# Patient Record
Sex: Male | Born: 1945 | Race: White | Hispanic: No | Marital: Married | Smoking: Former smoker
Health system: Southern US, Community
[De-identification: ages and names within clinical notes are randomized; demographics above are authoritative.]

## PROBLEM LIST (undated history)

## (undated) DIAGNOSIS — M545 Low back pain, unspecified: Secondary | ICD-10-CM

## (undated) DIAGNOSIS — G8929 Other chronic pain: Secondary | ICD-10-CM

## (undated) DIAGNOSIS — I10 Essential (primary) hypertension: Secondary | ICD-10-CM

## (undated) DIAGNOSIS — F32A Depression, unspecified: Secondary | ICD-10-CM

## (undated) DIAGNOSIS — Z86010 Personal history of colonic polyps: Secondary | ICD-10-CM

## (undated) DIAGNOSIS — I4891 Unspecified atrial fibrillation: Secondary | ICD-10-CM

## (undated) DIAGNOSIS — Z77098 Contact with and (suspected) exposure to other hazardous, chiefly nonmedicinal, chemicals: Secondary | ICD-10-CM

## (undated) DIAGNOSIS — F419 Anxiety disorder, unspecified: Secondary | ICD-10-CM

## (undated) DIAGNOSIS — B351 Tinea unguium: Secondary | ICD-10-CM

## (undated) DIAGNOSIS — M75102 Unspecified rotator cuff tear or rupture of left shoulder, not specified as traumatic: Secondary | ICD-10-CM

## (undated) DIAGNOSIS — N419 Inflammatory disease of prostate, unspecified: Secondary | ICD-10-CM

## (undated) DIAGNOSIS — E782 Mixed hyperlipidemia: Secondary | ICD-10-CM

## (undated) DIAGNOSIS — I214 Non-ST elevation (NSTEMI) myocardial infarction: Secondary | ICD-10-CM

## (undated) DIAGNOSIS — I480 Paroxysmal atrial fibrillation: Secondary | ICD-10-CM

## (undated) DIAGNOSIS — Z8701 Personal history of pneumonia (recurrent): Secondary | ICD-10-CM

## (undated) DIAGNOSIS — K219 Gastro-esophageal reflux disease without esophagitis: Secondary | ICD-10-CM

## (undated) DIAGNOSIS — G4730 Sleep apnea, unspecified: Secondary | ICD-10-CM

## (undated) DIAGNOSIS — M1990 Unspecified osteoarthritis, unspecified site: Secondary | ICD-10-CM

## (undated) DIAGNOSIS — I2510 Atherosclerotic heart disease of native coronary artery without angina pectoris: Secondary | ICD-10-CM

## (undated) DIAGNOSIS — E669 Obesity, unspecified: Secondary | ICD-10-CM

## (undated) DIAGNOSIS — G20A1 Parkinson's disease without dyskinesia, without mention of fluctuations: Secondary | ICD-10-CM

## (undated) DIAGNOSIS — F329 Major depressive disorder, single episode, unspecified: Secondary | ICD-10-CM

## (undated) DIAGNOSIS — G20 Parkinson's disease: Secondary | ICD-10-CM

## (undated) DIAGNOSIS — N529 Male erectile dysfunction, unspecified: Secondary | ICD-10-CM

## (undated) HISTORY — DX: Paroxysmal atrial fibrillation: I48.0

## (undated) HISTORY — PX: CATARACT EXTRACTION W/ INTRAOCULAR LENS  IMPLANT, BILATERAL: SHX1307

## (undated) HISTORY — PX: LUMBAR DISC SURGERY: SHX700

## (undated) HISTORY — DX: Unspecified atrial fibrillation: I48.91

## (undated) HISTORY — DX: Essential (primary) hypertension: I10

## (undated) HISTORY — DX: Personal history of pneumonia (recurrent): Z87.01

## (undated) HISTORY — DX: Gastro-esophageal reflux disease without esophagitis: K21.9

## (undated) HISTORY — DX: Obesity, unspecified: E66.9

## (undated) HISTORY — DX: Tinea unguium: B35.1

## (undated) HISTORY — PX: FRACTURE SURGERY: SHX138

## (undated) HISTORY — DX: Atherosclerotic heart disease of native coronary artery without angina pectoris: I25.10

## (undated) HISTORY — DX: Male erectile dysfunction, unspecified: N52.9

## (undated) HISTORY — DX: Low back pain: M54.5

## (undated) HISTORY — PX: KNEE ARTHROSCOPY: SHX127

## (undated) HISTORY — DX: Mixed hyperlipidemia: E78.2

## (undated) HISTORY — DX: Non-ST elevation (NSTEMI) myocardial infarction: I21.4

## (undated) HISTORY — PX: APPENDECTOMY: SHX54

## (undated) HISTORY — PX: BACK SURGERY: SHX140

## (undated) HISTORY — DX: Low back pain, unspecified: M54.50

## (undated) HISTORY — PX: ANTERIOR CERVICAL DECOMP/DISCECTOMY FUSION: SHX1161

## (undated) HISTORY — DX: Personal history of colonic polyps: Z86.010

## (undated) HISTORY — DX: Inflammatory disease of prostate, unspecified: N41.9

## (undated) HISTORY — PX: CARDIAC CATHETERIZATION: SHX172

---
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
## 1992-07-31 HISTORY — PX: WRIST FRACTURE SURGERY: SHX121

## 2007-12-31 ENCOUNTER — Ambulatory Visit: Payer: Self-pay | Admitting: Cardiology

## 2007-12-31 ENCOUNTER — Encounter: Payer: Self-pay | Admitting: Cardiology

## 2009-04-30 DEATH — deceased

## 2010-05-31 DIAGNOSIS — I214 Non-ST elevation (NSTEMI) myocardial infarction: Secondary | ICD-10-CM

## 2010-05-31 HISTORY — DX: Non-ST elevation (NSTEMI) myocardial infarction: I21.4

## 2010-06-17 ENCOUNTER — Ambulatory Visit: Payer: Self-pay | Admitting: Cardiology

## 2010-06-17 ENCOUNTER — Inpatient Hospital Stay (HOSPITAL_COMMUNITY): Admission: EM | Admit: 2010-06-17 | Discharge: 2010-06-22 | Payer: Self-pay | Admitting: Cardiology

## 2010-06-17 ENCOUNTER — Ambulatory Visit: Payer: Self-pay | Admitting: Cardiovascular Disease

## 2010-07-06 ENCOUNTER — Ambulatory Visit: Payer: Self-pay | Admitting: Cardiovascular Disease

## 2010-08-09 ENCOUNTER — Ambulatory Visit: Payer: Self-pay | Admitting: Cardiology

## 2010-08-12 ENCOUNTER — Ambulatory Visit: Payer: Self-pay | Admitting: Cardiology

## 2010-10-11 LAB — LIPID PANEL
HDL: 46 mg/dL (ref >39)
LDL Cholesterol: 82 mg/dL (ref 0–99)
Total CHOL/HDL Ratio: 3.1 RATIO
Triglycerides: 82 mg/dL (ref <150)
VLDL: 16 mg/dL (ref 0–40)

## 2010-10-11 LAB — CARDIAC PANEL(CRET KIN+CKTOT+MB+TROPI)
CK, MB: 1.2 ng/mL (ref 0.3–4.0)
Total CK: 80 U/L (ref 7–232)

## 2010-10-11 LAB — HEPARIN LEVEL (UNFRACTIONATED): Heparin Unfractionated: 0.68 IU/mL (ref 0.30–0.70)

## 2010-10-11 LAB — CBC
Hemoglobin: 14.2 g/dL (ref 13.0–17.0)
MCV: 95.5 fL (ref 78.0–100.0)
Platelets: 162 10*3/uL (ref 150–400)
RBC: 4.43 MIL/uL (ref 4.22–5.81)
WBC: 5.2 10*3/uL (ref 4.0–10.5)

## 2010-10-12 ENCOUNTER — Ambulatory Visit (INDEPENDENT_AMBULATORY_CARE_PROVIDER_SITE_OTHER): Payer: Self-pay | Admitting: Cardiology

## 2010-10-12 DIAGNOSIS — I253 Aneurysm of heart: Secondary | ICD-10-CM

## 2010-10-17 ENCOUNTER — Telehealth: Payer: Self-pay | Admitting: *Deleted

## 2010-10-17 MED ORDER — RANOLAZINE ER 500 MG PO TB12: 500.0000 mg | ORAL_TABLET | Freq: Two times a day (BID) | ORAL | Status: DC

## 2010-10-17 NOTE — Telephone Encounter (Signed)
Pt called, c/o chest pain for several weeks.  Pt saw Dr. Deborah Chalk on 10/12/10 with chest pain.  Dr. Deborah Chalk changed medications but, pt is still having chest pain 30 minutes after Imdur and for one hour after exercise.  Dr. Deborah Chalk notified and instructed RN to have pt stop Imdur 30 mg and start Ranexa 500 mg twice daily.  Pt notified and will come by to pick up samples.

## 2010-10-26 ENCOUNTER — Ambulatory Visit: Payer: Self-pay | Admitting: Cardiology

## 2010-10-31 ENCOUNTER — Encounter: Payer: Self-pay | Admitting: Cardiology

## 2010-10-31 DIAGNOSIS — I2510 Atherosclerotic heart disease of native coronary artery without angina pectoris: Secondary | ICD-10-CM | POA: Insufficient documentation

## 2010-10-31 DIAGNOSIS — E669 Obesity, unspecified: Secondary | ICD-10-CM | POA: Insufficient documentation

## 2010-10-31 DIAGNOSIS — I10 Essential (primary) hypertension: Secondary | ICD-10-CM | POA: Insufficient documentation

## 2010-10-31 DIAGNOSIS — E785 Hyperlipidemia, unspecified: Secondary | ICD-10-CM | POA: Insufficient documentation

## 2010-11-01 ENCOUNTER — Ambulatory Visit (INDEPENDENT_AMBULATORY_CARE_PROVIDER_SITE_OTHER): Payer: 59 | Admitting: Cardiology

## 2010-11-01 ENCOUNTER — Encounter: Payer: Self-pay | Admitting: Cardiology

## 2010-11-01 VITALS — BP 122/80 | HR 64 | Wt 227.0 lb

## 2010-11-01 DIAGNOSIS — R079 Chest pain, unspecified: Secondary | ICD-10-CM

## 2010-11-01 DIAGNOSIS — N529 Male erectile dysfunction, unspecified: Secondary | ICD-10-CM

## 2010-11-01 DIAGNOSIS — I208 Other forms of angina pectoris: Secondary | ICD-10-CM

## 2010-11-01 DIAGNOSIS — I209 Angina pectoris, unspecified: Secondary | ICD-10-CM

## 2010-11-01 DIAGNOSIS — I10 Essential (primary) hypertension: Secondary | ICD-10-CM

## 2010-11-01 NOTE — Assessment & Plan Note (Signed)
He would like to get off nitrates for angina in order to take Viagra.

## 2010-11-01 NOTE — Assessment & Plan Note (Addendum)
He is felt better since this prolonged episode of chest discomfort 2 weeks ago. His EKG shows some minor T-wave changes in the lateral leads which would be consistent with an occlusion of the small diagonal vessel documented by catheterization in November of 2011. I like to arrange to get a stress Cardiolite study to assess for function as well as possible scar. I think that he probably has occluded a small diagonal vessel and had a small infarction and now no longer has chronic ischemic myocardium. His coronary arteries were normal otherwise by catheterization in November of 2011.if he has a satisfactory stress study, we'll also need to evaluate possibility of sleep apnea.

## 2010-11-01 NOTE — Progress Notes (Signed)
Subjective:   David Combs a prolonged episode of chest discomfort on March 21 of his substernal discomfort lasted for approximately 2 hours. He was diaphoretic and nauseated. He went to [facility] did not get evaluated because of a long wait. He did not listen without any help. He felt somewhat weak and fatigued for a few days afterwards and then of the last 5 or 6 days has felt significantly better. He's not having as much discomfort and overall feels considerably better. He's having terrible drained since he started Ranexa was to discontinue that. They only occur at night. He is also interested in getting off of nitrates in order to use Viagra. He does snore heavily at night but has never been checked for sleep apnea. He's he slept well with Ambien last night  Current Outpatient Prescriptions  Medication Sig Dispense Refill  . acetaminophen (TYLENOL) 325 MG tablet Take 650 mg by mouth every 6 (six) hours as needed.        Marland Kitchen aspirin 325 MG tablet Take 325 mg by mouth daily.        . clopidogrel (PLAVIX) 75 MG tablet Take 75 mg by mouth daily.        Marland Kitchen GLUCOSAMINE-CHONDROITIN PO Take by mouth daily.        . metoprolol tartrate (LOPRESSOR) 25 MG tablet Take 25 mg by mouth. 1/2 TABLET ONE TO TWO TIMES DAILY       . nitroGLYCERIN (NITROSTAT) 0.4 MG SL tablet Place 0.4 mg under the tongue every 5 (five) minutes as needed.        . pantoprazole (PROTONIX) 40 MG tablet Take 40 mg by mouth daily.        . pravastatin (PRAVACHOL) 40 MG tablet Take 40 mg by mouth every other day.       . ranolazine (RANEXA) 500 MG 12 hr tablet Take 500 mg by mouth 1 dose over 46 hours.        Marland Kitchen DISCONTD: ranolazine (RANEXA) 500 MG 12 hr tablet Take 1 tablet (500 mg total) by mouth 2 (two) times daily.  60 tablet  11  . DISCONTD: isosorbide mononitrate (IMDUR) 30 MG 24 hr tablet Take 30 mg by mouth daily.        Marland Kitchen DISCONTD: losartan (COZAAR) 50 MG tablet Take 50 mg by mouth daily.          Allergies  Allergen  Reactions  . Iodinated Diagnostic Agents Swelling  . Sulfa Drugs Cross Reactors Itching    Patient Active Problem List  Diagnoses  . Angina effort  . Hypotension  . Dizziness  . Acute non-ST-segment elevation myocardial infarction  . SOB (shortness of breath)  . Hypertension  . Hyperlipemia  . Obesity  . GERD (gastroesophageal reflux disease)  . Tobacco abuse  . ED (erectile dysfunction)    History  Smoking status  . Former Smoker  . Quit date: 05/31/2002  Smokeless tobacco  . Never Used    History  Alcohol Use No    No family history on file.  Review of Systems:   The patient denies any heat or cold intolerance.  No weight gain or weight loss.  The patient denies headaches or blurry vision.  There is no cough or sputum production.  The patient denies dizziness.  There is no hematuria or hematochezia.  The patient denies any muscle aches or arthritis.  The patient denies any rash.  The patient denies frequent falling or instability.  There is no history of  depression or anxiety.  All other systems were reviewed and are negative.   Physical Exam:   Weight is 227. Blood pressure is 124/76 sitting, 122/80 standing, heart rate 64.The head is normocephalic and atraumatic.  Pupils are equally round and reactive to light.  Sclerae nonicteric.  Conjunctiva is clear.  Oropharynx is unremarkable.  There's adequate oral airway.  Neck is supple there are no masses.  Thyroid is not enlarged.  There is no lymphadenopathy.  Lungs are clear.  Chest is symmetric.  Heart shows a regular rate and rhythm.  S1 and S2 are normal.  There is no murmur click or gallop.  Abdomen is soft normal bowel sounds.  There is no organomegaly.  Genital and rectal deferred.  Extremities are without edema.  Peripheral pulses are adequate.  Neurologically intact.  Full range of motion.  The patient is not depressed.  Skin is warm and dry. Assessment / Plan:

## 2010-11-01 NOTE — Assessment & Plan Note (Signed)
Blood pressure remains well controlled on current medications.

## 2010-11-02 ENCOUNTER — Encounter (HOSPITAL_COMMUNITY): Payer: 59 | Admitting: Radiology

## 2010-11-07 ENCOUNTER — Ambulatory Visit: Payer: Self-pay | Admitting: Cardiology

## 2010-11-09 ENCOUNTER — Ambulatory Visit (HOSPITAL_COMMUNITY): Payer: PRIVATE HEALTH INSURANCE | Attending: Cardiology | Admitting: Radiology

## 2010-11-09 DIAGNOSIS — R0602 Shortness of breath: Secondary | ICD-10-CM

## 2010-11-09 DIAGNOSIS — R079 Chest pain, unspecified: Secondary | ICD-10-CM | POA: Insufficient documentation

## 2010-11-09 DIAGNOSIS — I2510 Atherosclerotic heart disease of native coronary artery without angina pectoris: Secondary | ICD-10-CM

## 2010-11-09 MED ORDER — TECHNETIUM TC 99M TETROFOSMIN IV KIT
33.0000 | PACK | Freq: Once | INTRAVENOUS | Status: AC | PRN
  Administered 2010-11-09: 33 via INTRAVENOUS

## 2010-11-09 MED ORDER — TECHNETIUM TC 99M TETROFOSMIN IV KIT
11.0000 | PACK | Freq: Once | INTRAVENOUS | Status: AC | PRN
  Administered 2010-11-09: 11 via INTRAVENOUS

## 2010-11-09 NOTE — Progress Notes (Signed)
[facility] 3 NUCLEAR MED [address] [place] [address] [phone]  Cardiology Nuclear Med Study  David Combs is a 65 y.o. male [id number] 04-21-1946   Nuclear Med Background Indication for Stress Test:  Evaluation for Ischemia and [facility]([place]) 10/19/10 with Chest Pain, left AMA History:  5-6 yrs ago GXT:OK per patient; 11/11 NSTEM>Cath:n/o CAD, EF=55-60%. Cardiac Risk Factors: History of Smoking, Hypertension, Lipids and Obesity  Symptoms:  Chest Pain/Pressure with Exertion (last date of chest discomfort was here today, 1/10), Diaphoresis, DOE, Nausea and SOB   Nuclear Pre-Procedure Caffeine/Decaff Intake:  None NPO After: 7:30am   Lungs:  Clear.   IV 0.9% NS with Angio Cath:  18g  IV Site: R Antecubital  IV Started by:  Stanton Kidney, EMT-P  Chest Size (in):  46 Cup Size: n/a  Height: 5\' 7"  (1.702 m)  Weight:  226 lb (102.513 kg)  BMI:  Body mass index is 35.40 kg/(m^2). Tech Comments:  Metoprolol held > 24 hours, per patient    Nuclear Med Study 1 or 2 day study: 1 day  Stress Test Type:  Stress  Reading MD: Kristeen Miss, MD  Order Authorizing Provider:  Roger Shelter, MD  Resting Radionuclide: Technetium 63m Tetrofosmin  Resting Radionuclide Dose: 11 mCi   Stress Radionuclide:  Technetium 44m Tetrofosmin  Stress Radionuclide Dose: 33 mCi           Stress Protocol Rest HR: 51 Stress HR:  138  Rest BP: 121/87 Stress BP: 172/71  Exercise Time (min): 10:00 METS: 11.0   Predicted Max HR: 157 bpm % Max HR: 87.9 bpm Rate Pressure Product: 21308   Dose of Adenosine (mg):  n/a Dose of Lexiscan: n/a mg  Dose of Atropine (mg): n/a Dose of Dobutamine: n/a mcg/kg/min (at max HR)  Stress Test Technologist: Rea College, CMA-N  Nuclear Technologist:  Doyne Keel, CNMT     Rest Procedure:  Myocardial perfusion imaging was performed at rest 45 minutes following the intravenous administration of Technetium 27m Tetrofosmin. Rest ECG:  Sinus bradycardia.  Stress Procedure:  The patient exercised for ten minutes on the treadmill utilizing the Bruce protocol.  The patient stopped due to fatigue.  He did c/o chest pain immediately post exercise, only 1/10.  There were no diagnostic ST-T wave changes.  Technetium 64m Tetrofosmin was injected at peak exercise and myocardial perfusion imaging was performed after a brief delay.  Stress ECG: No significant change from baseline ECG  QPS Raw Data Images:  Normal; no motion artifact; normal heart/lung ratio. Stress Images:  There is an apical defect with normal uptake in the other regions of the LV.   Rest Images:  There is apical defect with normal uptake in the other regions of the LV.  Subtraction (SDS):  There is a fixed apical defect consistent with a previous apical MI. Transient Ischemic Dilatation (Normal <1.22):  0.90 Lung/Heart Ratio (Normal <0.45):  0.35  Quantitative Gated Spect Images QGS EDV:  109 ml QGS ESV:  41 ml QGS cine images:  The overall LV function is normal.  There is mild hypokinesis of the apex as demonstrated by the surface LV images.  QGS EF: 62%  Impression Exercise Capacity:  Good exercise capacity. BP Response:  Normal blood pressure response. Clinical Symptoms:  No chest pain. ECG Impression:  No significant ST segment change suggestive of ischemia. Comparison with Prior Nuclear Study: No previous nuclear study performed  Overall Impression:  Low risk stress nuclear study.  There is evidence of a previous apical MI.  There is no evidence of reversible ischemia.   The overall LV function is normal.  There is mild hypokinesis of the apex as viewed in the surface LV images.   Elyn Aquas., MD

## 2010-11-10 ENCOUNTER — Encounter (HOSPITAL_COMMUNITY): Payer: Self-pay | Admitting: Cardiology

## 2010-11-10 NOTE — Progress Notes (Signed)
ROUTED TO DR. TENNANT.Falecha Clark ° °

## 2010-11-16 NOTE — Progress Notes (Signed)
David Combs This is a low risk study and satisfactory overall.

## 2010-11-17 ENCOUNTER — Telehealth: Payer: Self-pay | Admitting: *Deleted

## 2010-11-17 NOTE — Telephone Encounter (Signed)
Pt notified of nuclear results.  Pt will f/u with Dr. Deborah Chalk next week.

## 2010-11-17 NOTE — Progress Notes (Signed)
Pt notified of nuclear results.   

## 2010-11-23 ENCOUNTER — Encounter: Payer: Self-pay | Admitting: Cardiology

## 2010-11-23 ENCOUNTER — Ambulatory Visit (INDEPENDENT_AMBULATORY_CARE_PROVIDER_SITE_OTHER): Payer: 59 | Admitting: Cardiology

## 2010-11-23 DIAGNOSIS — I208 Other forms of angina pectoris: Secondary | ICD-10-CM

## 2010-11-23 DIAGNOSIS — I209 Angina pectoris, unspecified: Secondary | ICD-10-CM

## 2010-11-23 DIAGNOSIS — E669 Obesity, unspecified: Secondary | ICD-10-CM

## 2010-11-23 NOTE — Assessment & Plan Note (Signed)
I encouraged him to try lose 20-25 pounds

## 2010-11-23 NOTE — Progress Notes (Signed)
Subjective:   David Combs is seen today for followup of his stress Cardiolite study. He is small apical fixed defect with no evidence of ischemia and well-preserved global ejection fraction of 62%. Overall, he is felt to be at low risk. He is only taking his metoprolol once a day. In general, he has been feeling well and has been exercising on his treadmill.  Current Outpatient Prescriptions  Medication Sig Dispense Refill  . acetaminophen (TYLENOL) 325 MG tablet Take 650 mg by mouth every 6 (six) hours as needed.        Marland Kitchen aspirin 325 MG tablet Take 325 mg by mouth daily.        . clopidogrel (PLAVIX) 75 MG tablet Take 75 mg by mouth daily.        Marland Kitchen GLUCOSAMINE-CHONDROITIN PO Take by mouth daily.        . metoprolol tartrate (LOPRESSOR) 25 MG tablet Take 12.5 mg by mouth daily.       . nitroGLYCERIN (NITROSTAT) 0.4 MG SL tablet Place 0.4 mg under the tongue every 5 (five) minutes as needed.        . pantoprazole (PROTONIX) 40 MG tablet Take 40 mg by mouth daily.        . pravastatin (PRAVACHOL) 40 MG tablet Take 40 mg by mouth every other day.         Allergies  Allergen Reactions  . Iodinated Diagnostic Agents Swelling  . Sulfa Drugs Cross Reactors Itching    Patient Active Problem List  Diagnoses  . Angina effort  . Hypotension  . Dizziness  . Acute non-ST-segment elevation myocardial infarction  . SOB (shortness of breath)  . Hypertension  . Hyperlipemia  . Obesity  . GERD (gastroesophageal reflux disease)  . Tobacco abuse  . ED (erectile dysfunction)    History  Smoking status  . Former Smoker  . Quit date: 05/31/2002  Smokeless tobacco  . Never Used    History  Alcohol Use No    No family history on file.  Review of Systems:   The patient denies any heat or cold intolerance.  No weight gain or weight loss.  The patient denies headaches or blurry vision.  There is no cough or sputum production.  The patient denies dizziness.  There is no hematuria or hematochezia.   The patient denies any muscle aches or arthritis.  The patient denies any rash.  The patient denies frequent falling or instability.  There is no history of depression or anxiety.  All other systems were reviewed and are negative.   Physical Exam:   Weight is 228. Blood pressure is 152/88 sitting, heart rate is 70. Blood pressure from home are reviewed and are satisfactory.The head is normocephalic and atraumatic.  Pupils are equally round and reactive to light.  Sclerae nonicteric.  Conjunctiva is clear.  Oropharynx is unremarkable.  There's adequate oral airway.  Neck is supple there are no masses.  Thyroid is not enlarged.  There is no lymphadenopathy.  Lungs are clear.  Chest is symmetric.  Heart shows a regular rate and rhythm.  S1 and S2 are normal.  There is no murmur click or gallop.  Abdomen is soft normal bowel sounds.  There is no organomegaly.  Genital and rectal deferred.  Extremities are without edema.  Peripheral pulses are adequate.  Neurologically intact.  Full range of motion.  The patient is not depressed.  Skin is warm and dry.  Assessment / Plan:

## 2010-11-23 NOTE — Assessment & Plan Note (Signed)
Overall, [name] is at low risk. I've encouraged him to exercise and try lose 20 pounds and be under 200 pounds total. I will refer him to Dr. Diona Browner. I've given approval for him to use Viagra or Levitra. He is no longer on nitrates. I'll have him see Dr. Diona Browner in 4 months.

## 2011-01-25 ENCOUNTER — Encounter: Payer: Self-pay | Admitting: Cardiology

## 2011-03-02 ENCOUNTER — Ambulatory Visit: Payer: 59 | Admitting: Cardiology

## 2011-03-20 LAB — CBC AND DIFFERENTIAL: Hemoglobin: 14.7 g/dL (ref 13.5–17.5)

## 2011-03-20 LAB — BASIC METABOLIC PANEL: Glucose: 85 mg/dL

## 2011-03-20 LAB — HEPATIC FUNCTION PANEL: AST: 17 U/L (ref 14–40)

## 2011-03-21 ENCOUNTER — Encounter (INDEPENDENT_AMBULATORY_CARE_PROVIDER_SITE_OTHER): Payer: Self-pay

## 2011-03-21 ENCOUNTER — Encounter (INDEPENDENT_AMBULATORY_CARE_PROVIDER_SITE_OTHER): Payer: Self-pay | Admitting: *Deleted

## 2011-03-31 ENCOUNTER — Encounter: Payer: Self-pay | Admitting: Cardiology

## 2011-03-31 ENCOUNTER — Ambulatory Visit (INDEPENDENT_AMBULATORY_CARE_PROVIDER_SITE_OTHER): Payer: 59 | Admitting: Cardiology

## 2011-03-31 VITALS — BP 123/77 | HR 54 | Ht 67.0 in | Wt 226.0 lb

## 2011-03-31 DIAGNOSIS — I10 Essential (primary) hypertension: Secondary | ICD-10-CM

## 2011-03-31 DIAGNOSIS — N529 Male erectile dysfunction, unspecified: Secondary | ICD-10-CM

## 2011-03-31 DIAGNOSIS — K219 Gastro-esophageal reflux disease without esophagitis: Secondary | ICD-10-CM

## 2011-03-31 DIAGNOSIS — I2510 Atherosclerotic heart disease of native coronary artery without angina pectoris: Secondary | ICD-10-CM

## 2011-03-31 DIAGNOSIS — E785 Hyperlipidemia, unspecified: Secondary | ICD-10-CM

## 2011-03-31 NOTE — Assessment & Plan Note (Signed)
LDL under 100 on Pravachol. Continue to follow.

## 2011-03-31 NOTE — Patient Instructions (Signed)
Your physician wants you to follow-up in: 4 months. You will receive a reminder letter in the mail one-two months in advance. If you don't receive a letter, please call our office to schedule the follow-up appointment. Your physician recommends that you continue on your current medications as directed. Please refer to the Current Medication list given to you today. 

## 2011-03-31 NOTE — Assessment & Plan Note (Signed)
Blood pressure well-controlled today. 

## 2011-03-31 NOTE — Assessment & Plan Note (Signed)
Continues on proton pump inhibitor.

## 2011-03-31 NOTE — Assessment & Plan Note (Signed)
Has been using Cialis most recently. No progressive angina or nitroglycerin use.

## 2011-03-31 NOTE — Progress Notes (Signed)
Clinical Summary Mr. Stailey is a 65 y.o.male presenting to establish followup. He is a former patient of Dr. Deborah Chalk, most recently seen back in April. He had a followup Myoview done at that time that showed evidence of apical scar, no ischemia, LVEF of 62%. He has been managed medically, reports no recurrent angina following an episode of prolonged chest pain that occurred in March.  He reports no bleeding problems on aspirin and Plavix. Does use medication for erectile dysfunction, although reports no use of nitroglycerin.  Recent lipid profile was obtained by primary care showing an LDL under 100.  He states that he will be having a screening colonoscopy with Dr. Karilyn Cota in the near future.  Allergies  Allergen Reactions  . Iodinated Diagnostic Agents Swelling  . Sulfa Drugs Cross Reactors Itching    Medication list reviewed.  Past Medical History  Diagnosis Date  . Non-ST elevation myocardial infarction (NSTEMI)     11/11  . Essential hypertension, benign   . Mixed hyperlipidemia   . Obesity   . GERD (gastroesophageal reflux disease)   . ED (erectile dysfunction)   . Dermatophytosis of nail   . Prostatitis, unspecified   . Lumbago   . GERD (gastroesophageal reflux disease)   . Coronary atherosclerosis of native coronary artery     70-80% small diagonal - medically managed, LVEF 55%    Past Surgical History  Procedure Date  . Cervical spine surgery   . Right knee surgery   . Appendectomy   . Tonsillectomy AGE 5  . Left wrist fracture surgery 1994    No family history on file.  Social History Mr. Rawl reports that he quit smoking about 8 years ago. His smoking use included Cigarettes. He has a 40 pack-year smoking history. He has never used smokeless tobacco. Mr. Scarlata reports that he does not drink alcohol.  Review of Systems No palpitations or syncope. Otherwise reviewed and negative.  Physical Examination Filed Vitals:   03/31/11 1451  BP: 123/77  Pulse:  54  Overweight male in no acute distress. HEENT: Conjunctiva and lids normal, oropharynx is moist mucosa. Neck: Supple, no elevated JVP or carotid bruits. Lungs: Clear to auscultation, nonlabored Cardiac: Regular rate and rhythm, soft systolic murmur at the base, normal second heart sound, no S3. Abdomen: Soft, nontender, bowel sounds present. Skin: Warm and dry. Extremities: No pitting edema, distal pulses full. Musculoskeletal: No kyphosis. Neuropsychiatric: Alert and oriented x3, affect appropriate.   ECG Reviewed in EMR.    Problem List and Plan

## 2011-03-31 NOTE — Assessment & Plan Note (Signed)
Symptomatically stable on medical therapy. Recent Myoview from April is reviewed. Plan to continue observation at this point.

## 2011-04-17 ENCOUNTER — Ambulatory Visit (INDEPENDENT_AMBULATORY_CARE_PROVIDER_SITE_OTHER): Payer: 59 | Admitting: Internal Medicine

## 2011-06-15 ENCOUNTER — Telehealth: Payer: Self-pay | Admitting: *Deleted

## 2011-06-15 NOTE — Telephone Encounter (Signed)
Pt left message on voicemail asking for a return call regarding his medications.   Pt states he has been having bad dreams/nightmares. Pt saw dr at [facility] for these. He gave pt prazosin 2 mg and Mirtazapine 30 mg to take at bedtime. He would like to make sure Dr. Diona Browner is okay with him taking these meds with his previously prescribed medications.

## 2011-06-15 NOTE — Telephone Encounter (Signed)
No direct contraindication for him to take mirtazapine from a cardiac perspective. Prazosin is an alpha blocker, and could therefore lower his blood pressure further. He would need to keep a close eye on this in the setting of other medical therapy for hypertension. This medicine may also be a concern with him using Viagra, as it could also adversely lower blood pressure. I would hope that his [place] physician discussed these issues with him after reviewing his medications.

## 2011-06-16 NOTE — Telephone Encounter (Signed)
Pt notified. He will monitor BP and notify [facility] if BP seems to drop.

## 2011-07-12 ENCOUNTER — Encounter: Payer: Self-pay | Admitting: Cardiology

## 2011-07-12 ENCOUNTER — Ambulatory Visit (INDEPENDENT_AMBULATORY_CARE_PROVIDER_SITE_OTHER): Payer: PRIVATE HEALTH INSURANCE | Admitting: Cardiology

## 2011-07-12 VITALS — BP 118/73 | HR 56 | Ht 67.0 in | Wt 238.0 lb

## 2011-07-12 DIAGNOSIS — I10 Essential (primary) hypertension: Secondary | ICD-10-CM

## 2011-07-12 DIAGNOSIS — E669 Obesity, unspecified: Secondary | ICD-10-CM

## 2011-07-12 DIAGNOSIS — I2510 Atherosclerotic heart disease of native coronary artery without angina pectoris: Secondary | ICD-10-CM

## 2011-07-12 DIAGNOSIS — E785 Hyperlipidemia, unspecified: Secondary | ICD-10-CM

## 2011-07-12 MED ORDER — METOPROLOL TARTRATE 25 MG PO TABS: 25.0000 mg | ORAL_TABLET | Freq: Two times a day (BID) | ORAL | Status: DC

## 2011-07-12 MED ORDER — CLOPIDOGREL BISULFATE 75 MG PO TABS: 75.0000 mg | ORAL_TABLET | Freq: Every day | ORAL | Status: DC

## 2011-07-12 NOTE — Progress Notes (Signed)
Clinical Summary [name] is a 65 y.o.male presenting for followup. He was seen in August. He reports no progressive chest pain symptoms or shortness of breath. Does not use nitroglycerin with any regularity. He reports compliance with his medications. Record of home blood pressures shows systolics ranging from 120-140 in general over diastolics typically in the 80s. We discussed diet and sodium restriction, also considering a regular walking regimen for exercise.  Lab work back in August showed AST 17, ALT 17, cholesterol 165, HDL 49, LDL 96, triglycerides 99. He was taking Pravachol at that time. Subsequently, he reports progressive aching in his arms and legs, stopped Pravachol, and within a few weeks his symptoms resolved. He reports having trouble with simvastatin previously.   Allergies  Allergen Reactions  . Iodinated Diagnostic Agents Swelling  . Sulfa Drugs Cross Reactors Itching    Medication list reviewed.  Past Medical History  Diagnosis Date  . Non-ST elevation myocardial infarction (NSTEMI)     11/11  . Essential hypertension, benign   . Mixed hyperlipidemia   . Obesity   . GERD (gastroesophageal reflux disease)   . ED (erectile dysfunction)   . Dermatophytosis of nail   . Prostatitis, unspecified   . Lumbago   . GERD (gastroesophageal reflux disease)   . Coronary atherosclerosis of native coronary artery     70-80% small diagonal - medically managed, LVEF 55%    Social History [name] that he quit smoking about 9 years ago. His smoking use included Cigarettes. He has a 40 pack-year smoking history. He has never used smokeless tobacco. [name] that he does not drink alcohol.  Review of Systems No palpitations or syncope. Has not been exercising. Has also gained weight which she attributes to his diet. No orthopnea or PND. Some difficulty sleeping. Otherwise reviewed and negative.  Physical Examination Filed Vitals:   07/12/11 0823  BP:  118/73  Pulse: 56   Overweight male in no acute distress.  HEENT: Conjunctiva and lids normal, oropharynx is moist mucosa.  Neck: Supple, no elevated JVP or carotid bruits.  Lungs: Clear to auscultation, nonlabored  Cardiac: Regular rate and rhythm, soft systolic murmur at the base, normal second heart sound, no S3.  Abdomen: Soft, nontender, bowel sounds present.  Skin: Warm and dry.  Extremities: No pitting edema, distal pulses full.  Musculoskeletal: No kyphosis.  Neuropsychiatric: Alert and oriented x3, affect appropriate.   ECG Reviewed in EMR.  Problem List and Plan

## 2011-07-12 NOTE — Assessment & Plan Note (Signed)
Symptomatically stable. Continue medical therapy. We discussed diet and exercise. Needed refills for Plavix and metoprolol which were provided. Followup ECG is stable. Continue observation.

## 2011-07-12 NOTE — Patient Instructions (Signed)
Your physician wants you to follow-up in: 6 months. You will receive a reminder letter in the mail one-two months in advance. If you don't receive a letter, please call our office to schedule the follow-up appointment. Your physician recommends that you continue on your current medications as directed. Please refer to the Current Medication list given to you today. Your physician recommends that you have a FASTING lipid profile done in 3 MONTHS. Do not eat or drink after midnight. You will be mailed a letter when it is time for this lab work. When you receive your letter, call the office to have order sent to the [facility].

## 2011-07-12 NOTE — Assessment & Plan Note (Signed)
Weight loss discussed  

## 2011-07-12 NOTE — Assessment & Plan Note (Signed)
Continue to check at home. Reinforced diet and sodium restriction. He is compliant with his medications.

## 2011-07-12 NOTE — Assessment & Plan Note (Signed)
Did not tolerate Pravachol as outlined above. Plan will be to recheck FLP in 3 months. Might consider a different low-dose statin preparation such as Lipitor or Crestor next.

## 2011-07-21 ENCOUNTER — Ambulatory Visit: Payer: 59 | Admitting: Cardiology

## 2011-09-12 ENCOUNTER — Other Ambulatory Visit: Payer: Self-pay | Admitting: *Deleted

## 2011-09-12 DIAGNOSIS — E782 Mixed hyperlipidemia: Secondary | ICD-10-CM

## 2011-10-02 ENCOUNTER — Encounter: Payer: Self-pay | Admitting: *Deleted

## 2011-10-04 ENCOUNTER — Telehealth: Payer: Self-pay | Admitting: *Deleted

## 2011-10-04 DIAGNOSIS — Z79899 Other long term (current) drug therapy: Secondary | ICD-10-CM

## 2011-10-04 DIAGNOSIS — E785 Hyperlipidemia, unspecified: Secondary | ICD-10-CM

## 2011-10-04 MED ORDER — ATORVASTATIN CALCIUM 10 MG PO TABS: 10.0000 mg | ORAL_TABLET | Freq: Every day | ORAL | Status: DC

## 2011-10-04 NOTE — Telephone Encounter (Signed)
Message copied by Arlyss Gandy on Wed Oct 04, 2011  3:51 PM ------      Message from: Jonelle Sidle      Created: Tue Oct 03, 2011 10:59 AM       Reviewed. Lipid numbers are actually not too bad. LDL is 113, optimally should be under 100 for aggresive control. He had trouble with Pravachol previously. If he would like, could consider trying either low-dose Crestor 5 mg or Lipitor 10 mg for aggressive management. Otherwise continue diet.

## 2011-10-04 NOTE — Telephone Encounter (Signed)
Pt notified of results and verbalized understanding. Pt is willing to try meds. He will try Lipitor because it will be less expensive. He is due for f/u with Dr. Diona Browner in June. He will have FLP/LFT done a few days before this appt.

## 2012-01-08 ENCOUNTER — Ambulatory Visit: Payer: PRIVATE HEALTH INSURANCE | Admitting: Cardiology

## 2012-01-23 ENCOUNTER — Telehealth: Payer: Self-pay | Admitting: *Deleted

## 2012-01-23 NOTE — Telephone Encounter (Signed)
Called to inform patient that lab work needed b/4 appointment on 07/05. Patient stated he is going on 07/01 to [facility] lab.

## 2012-02-02 ENCOUNTER — Ambulatory Visit (INDEPENDENT_AMBULATORY_CARE_PROVIDER_SITE_OTHER): Payer: BC Managed Care – PPO | Admitting: Cardiology

## 2012-02-02 ENCOUNTER — Telehealth: Payer: Self-pay | Admitting: *Deleted

## 2012-02-02 ENCOUNTER — Encounter: Payer: Self-pay | Admitting: Cardiology

## 2012-02-02 VITALS — BP 133/87 | HR 54 | Ht 67.0 in | Wt 242.8 lb

## 2012-02-02 DIAGNOSIS — I2510 Atherosclerotic heart disease of native coronary artery without angina pectoris: Secondary | ICD-10-CM

## 2012-02-02 DIAGNOSIS — I10 Essential (primary) hypertension: Secondary | ICD-10-CM

## 2012-02-02 DIAGNOSIS — E782 Mixed hyperlipidemia: Secondary | ICD-10-CM

## 2012-02-02 MED ORDER — ATORVASTATIN CALCIUM 10 MG PO TABS: 10.0000 mg | ORAL_TABLET | Freq: Every day | ORAL | Status: DC

## 2012-02-02 MED ORDER — CLOPIDOGREL BISULFATE 75 MG PO TABS: 75.0000 mg | ORAL_TABLET | Freq: Every day | ORAL | Status: DC

## 2012-02-02 MED ORDER — PANTOPRAZOLE SODIUM 40 MG PO TBEC: 40.0000 mg | DELAYED_RELEASE_TABLET | Freq: Two times a day (BID) | ORAL | Status: DC

## 2012-02-02 MED ORDER — METOPROLOL TARTRATE 25 MG PO TABS: 25.0000 mg | ORAL_TABLET | Freq: Two times a day (BID) | ORAL | Status: DC

## 2012-02-02 NOTE — Assessment & Plan Note (Signed)
He is tolerating low-dose Lipitor and his lipid numbers look better.

## 2012-02-02 NOTE — Assessment & Plan Note (Signed)
Stable angina on medical therapy. Continue observation for now.

## 2012-02-02 NOTE — Telephone Encounter (Signed)
Patient informed. 

## 2012-02-02 NOTE — Patient Instructions (Addendum)
Your physician recommends that you schedule a follow-up appointment in: 3 months with Dr. Diona Browner. You will receive a reminder letter in the mail in about 1-2 months reminding you to call and schedule your appointment. If you don't receive this letter, please contact our office. Your physician recommends that you continue on your current medications as directed. Please refer to the Current Medication list given to you today.

## 2012-02-02 NOTE — Telephone Encounter (Signed)
Message copied by Eustace Moore on Fri Feb 02, 2012 11:39 AM ------      Message from: Jonelle Sidle      Created: Tue Jan 30, 2012  2:23 PM       Normal AST and ALT. LDL looks good at 89.

## 2012-02-02 NOTE — Progress Notes (Signed)
Clinical Summary David Combs is a 66 y.o.male presenting for followup. He was seen back in December 2012. He reports occasional exertional angina, has not needed nitroglycerin however. He reports compliance with his medications, and has been able to tolerate low-dose Lipitor.  Recent lab work shows AST 19, ALT 20, triglycerides 136, cholesterol 165, HDL 49, LDL 89. Review these numbers today.  We discussed natural history of CAD, indications for medical therapy. Also advised diet and increasing his regular walking regimen.   Allergies  Allergen Reactions  . Iodinated Diagnostic Agents Swelling  . Sulfa Drugs Cross Reactors Itching    Current Outpatient Prescriptions  Medication Sig Dispense Refill  . acetaminophen (TYLENOL) 325 MG tablet Take 650 mg by mouth every 6 (six) hours as needed.        Marland Kitchen aspirin EC 81 MG tablet Take 81 mg by mouth daily.        Marland Kitchen atorvastatin (LIPITOR) 10 MG tablet Take 1 tablet (10 mg total) by mouth daily.  90 tablet  2  . clopidogrel (PLAVIX) 75 MG tablet Take 1 tablet (75 mg total) by mouth daily.  90 tablet  3  . fish oil-omega-3 fatty acids 1000 MG capsule Take 2 g by mouth daily.      . Glucosamine-Chondroit-Vit C-Mn (GLUCOSAMINE 1500 COMPLEX PO) Take 1 tablet by mouth.      . metoprolol tartrate (LOPRESSOR) 25 MG tablet Take 1 tablet (25 mg total) by mouth 2 (two) times daily.  180 tablet  3  . pantoprazole (PROTONIX) 40 MG tablet Take 40 mg by mouth 2 (two) times daily.       . sildenafil (VIAGRA) 50 MG tablet Take 50 mg by mouth as needed.       . nitroGLYCERIN (NITROSTAT) 0.4 MG SL tablet Place 0.4 mg under the tongue every 5 (five) minutes as needed.          Past Medical History  Diagnosis Date  . Non-ST elevation myocardial infarction (NSTEMI)     11/11  . Essential hypertension, benign   . Mixed hyperlipidemia   . Obesity   . GERD (gastroesophageal reflux disease)   . ED (erectile dysfunction)   . Dermatophytosis of nail   .  Prostatitis, unspecified   . Lumbago   . GERD (gastroesophageal reflux disease)   . Coronary atherosclerosis of native coronary artery     70-80% small diagonal - medically managed, LVEF 55%    Social History David Combs reports that he quit smoking about 9 years ago. His smoking use included Cigarettes. He has a 40 pack-year smoking history. He has never used smokeless tobacco. David Combs reports that he does not drink alcohol.  Review of Systems No reported bleeding problems. No palpitations or syncope. Otherwise negative.  Physical Examination Filed Vitals:   02/02/12 0935  BP: 133/87  Pulse: 54    Overweight male in no acute distress.  HEENT: Conjunctiva and lids normal, oropharynx is moist mucosa.  Neck: Supple, no elevated JVP or carotid bruits.  Lungs: Clear to auscultation, nonlabored  Cardiac: Regular rate and rhythm, soft systolic murmur at the base, normal second heart sound, no S3.  Abdomen: Soft, nontender, bowel sounds present.  Skin: Warm and dry.  Extremities: No pitting edema, distal pulses full.  Musculoskeletal: No kyphosis.  Neuropsychiatric: Alert and oriented x3, affect appropriate.    Problem List and Plan   Coronary atherosclerosis of native coronary artery Stable angina on medical therapy. Continue observation for now.  Essential  hypertension, benign No changes made to current medical regimen.  Hyperlipemia He is tolerating low-dose Lipitor and his lipid numbers look better.    Jonelle Sidle, M.D., F.A.C.C.

## 2012-02-02 NOTE — Assessment & Plan Note (Signed)
No changes made to current medical regimen. 

## 2012-03-29 ENCOUNTER — Telehealth: Payer: Self-pay | Admitting: Cardiology

## 2012-03-29 NOTE — Telephone Encounter (Signed)
Patient agrees to holding plavix for 5 days prior to procedure and verbalizes understanding of plan. Called Dr. Robinette Haines office and left message to call our office r/e request.

## 2012-03-29 NOTE — Telephone Encounter (Signed)
Please find out if Dr. Hazle Quant plans to do the procedure on David Combs, or if he would like David Combs held. It would be reasonable to hold David Combs for 5 days prior to procedure if needed.

## 2012-03-29 NOTE — Telephone Encounter (Signed)
[name] is on Plavix. He called stating that Dr. Hazle Quant is going to have to remove a skin tag off of his eye. Patient is concerned About being on plavix.  Dr. Angelica Pou # [phone]

## 2012-04-02 NOTE — Telephone Encounter (Signed)
Called and informed patient that per Amy at [facility] office that he didn't have to hold his plavix.

## 2012-04-02 NOTE — Telephone Encounter (Signed)
Called and spoke with David Combs at Dr. Randon Goldsmith office and informed her of the below response. David Combs informed nurse that patient didn't need to hold his plavix. Left message for patient to call office.

## 2012-06-19 ENCOUNTER — Encounter: Payer: Self-pay | Admitting: Cardiology

## 2012-06-19 ENCOUNTER — Ambulatory Visit (INDEPENDENT_AMBULATORY_CARE_PROVIDER_SITE_OTHER): Payer: MEDICARE | Admitting: Cardiology

## 2012-06-19 VITALS — BP 129/75 | HR 50 | Ht 67.0 in | Wt 242.0 lb

## 2012-06-19 DIAGNOSIS — I10 Essential (primary) hypertension: Secondary | ICD-10-CM

## 2012-06-19 DIAGNOSIS — E785 Hyperlipidemia, unspecified: Secondary | ICD-10-CM

## 2012-06-19 DIAGNOSIS — I2510 Atherosclerotic heart disease of native coronary artery without angina pectoris: Secondary | ICD-10-CM

## 2012-06-19 MED ORDER — AMLODIPINE BESYLATE 2.5 MG PO TABS: 2.5000 mg | ORAL_TABLET | Freq: Every day | ORAL | Status: DC

## 2012-06-19 NOTE — Patient Instructions (Addendum)
Your physician recommends that you schedule a follow-up appointment in: 6 months. You will receive a reminder letter in the mail in about 4 months reminding you to call and schedule your appointment. If you don't receive this letter, please contact our office. Your physician has recommended you make the following change in your medication: Start norvasc 2.5 mg daily. All other medications will remain the same. Your new prescription has been sent to your pharmacy.

## 2012-06-19 NOTE — Progress Notes (Signed)
Clinical Summary Mr. Howick is a 66 y.o.male presenting for followup. He was seen in July. He states that he has had been experiencing dyspnea on exertion with higher levels of activity such as walking up stairs or inclines, otherwise tends to do okay on level ground. He denies any frank chest pain, has not used any nitroglycerin. He states Dr. Sherril Croon increased his beta blocker dose, although he is fairly bradycardic today. He will also be having an evaluation for sleep apnea with Dr. Andrey Campanile.  ECG today shows sinus bradycardia at 51 beats per minute.   Allergies  Allergen Reactions  . Iodinated Diagnostic Agents Swelling  . Sulfa Drugs Cross Reactors Itching    Current Outpatient Prescriptions  Medication Sig Dispense Refill  . acetaminophen (TYLENOL) 325 MG tablet Take 650 mg by mouth every 6 (six) hours as needed.        Marland Kitchen aspirin EC 81 MG tablet Take 81 mg by mouth daily.        Marland Kitchen atorvastatin (LIPITOR) 10 MG tablet Take 1 tablet (10 mg total) by mouth daily.  90 tablet  3  . clopidogrel (PLAVIX) 75 MG tablet Take 1 tablet (75 mg total) by mouth daily.  90 tablet  3  . fish oil-omega-3 fatty acids 1000 MG capsule Take 2 g by mouth daily.      . Glucosamine-Chondroit-Vit C-Mn (GLUCOSAMINE 1500 COMPLEX PO) Take 1 tablet by mouth.      . metoprolol tartrate (LOPRESSOR) 25 MG tablet Take 1 tablet (25 mg total) by mouth 2 (two) times daily.  180 tablet  3  . nitroGLYCERIN (NITROSTAT) 0.4 MG SL tablet Place 0.4 mg under the tongue every 5 (five) minutes as needed.        . pantoprazole (PROTONIX) 40 MG tablet Take 1 tablet (40 mg total) by mouth 2 (two) times daily.  180 tablet  3  . sildenafil (VIAGRA) 50 MG tablet Take 50 mg by mouth as needed.       Marland Kitchen amLODipine (NORVASC) 2.5 MG tablet Take 1 tablet (2.5 mg total) by mouth daily.  180 tablet  3    Past Medical History  Diagnosis Date  . Non-ST elevation myocardial infarction (NSTEMI)     11/11  . Essential hypertension, benign   .  Mixed hyperlipidemia   . Obesity   . GERD (gastroesophageal reflux disease)   . ED (erectile dysfunction)   . Dermatophytosis of nail   . Prostatitis, unspecified   . Lumbago   . GERD (gastroesophageal reflux disease)   . Coronary atherosclerosis of native coronary artery     70-80% small diagonal - medically managed, LVEF 55%    Social History Mr. Berard reports that he quit smoking about 10 years ago. His smoking use included Cigarettes. He has a 40 pack-year smoking history. He has never used smokeless tobacco. Mr. Harling reports that he does not drink alcohol.  Review of Systems No dizziness or syncope. States he snores and has nightmares fairly consistently. Stable appetite. Will be having a screening colonoscopy sent. Otherwise negative.  Physical Examination Filed Vitals:   06/19/12 1425  BP: 129/75  Pulse: 50   Filed Weights   06/19/12 1425  Weight: 242 lb (109.77 kg)    HEENT: Conjunctiva and lids normal, oropharynx is moist mucosa.  Neck: Supple, no elevated JVP or carotid bruits.  Lungs: Clear to auscultation, nonlabored  Cardiac: Regular rate and rhythm, soft systolic murmur at the base, normal second heart sound, no S3.  Abdomen: Soft, nontender, bowel sounds present.  Skin: Warm and dry.  Extremities: No pitting edema, distal pulses full.  Musculoskeletal: No kyphosis.  Neuropsychiatric: Alert and oriented x3, affect appropriate.   Problem List and Plan   Coronary atherosclerosis of native coronary artery Will cut Lopressor back to 25 mg twice daily in case he is experiencing any symptomatic bradycardia. Add Norvasc 2.5 mg daily as an antianginal and antihypertensive. Otherwise continue medical therapy and followup.  Essential hypertension, benign Norvasc 2.5 mg daily being added.  Hyperlipemia Keep followup with Dr. Sherril Croon. He continues on Lipitor. Goal LDL should be around 70 if possible.    Jonelle Sidle, M.D., F.A.C.C.

## 2012-06-19 NOTE — Assessment & Plan Note (Addendum)
Keep followup with Dr. Sherril Croon. He continues on Lipitor. Goal LDL should be around 70 if possible.

## 2012-06-19 NOTE — Assessment & Plan Note (Signed)
Norvasc 2.5 mg daily being added.

## 2012-06-19 NOTE — Assessment & Plan Note (Signed)
Will cut Lopressor back to 25 mg twice daily in case he is experiencing any symptomatic bradycardia. Add Norvasc 2.5 mg daily as an antianginal and antihypertensive. Otherwise continue medical therapy and followup.

## 2012-07-12 ENCOUNTER — Telehealth: Payer: Self-pay | Admitting: *Deleted

## 2012-07-12 DIAGNOSIS — E785 Hyperlipidemia, unspecified: Secondary | ICD-10-CM

## 2012-07-12 NOTE — Telephone Encounter (Signed)
Patient has been off of atorvastatin for 3 weeks and all his muscle pain has stopped. Patient wanted to make MD aware.

## 2012-07-12 NOTE — Telephone Encounter (Signed)
Noted. Lipitor was already at low dose. We can always try something else if he would like. Why don't we start with getting FLP in about 3 months to see where his LDL settles off medication.

## 2012-07-15 NOTE — Telephone Encounter (Signed)
Left message for patient to call office.  

## 2012-07-15 NOTE — Telephone Encounter (Signed)
Line busy x's 3. Will retry later.

## 2012-07-16 NOTE — Telephone Encounter (Signed)
Patient informed and lab order faxed to [facility] lab. 

## 2012-07-30 ENCOUNTER — Telehealth: Payer: Self-pay | Admitting: *Deleted

## 2012-07-30 NOTE — Telephone Encounter (Signed)
C/O weakness in the muscles in the top part of his legs.  States his cholesterol med was d/c'd in November by Dr. Diona Browner.  States that the weakness and muscle aches are better in his arms and legs, but continue to have problems.  Stated he fell out of boat 4 x over last week or so due to not being able to pull himself up.  No c/o dizziness.  Patient is now questioning if the new med (Amlodipine) could be adding to his problem.  Asked patient if he had discussed this with his PMD & he states that he has had problems for years with his legs and mentioned to PMD, but never does anything about it.  Advised patient will send to Dr. Diona Browner for advice.

## 2012-07-31 NOTE — Telephone Encounter (Signed)
Not clear that the Norvasc is doing this, but he could hold the medication and see if the symptoms get better. If they do not, he might ask his primary MD for a referral to Neurologist for further evaluation.

## 2012-08-02 NOTE — Telephone Encounter (Signed)
Left message to return call 

## 2012-08-02 NOTE — Telephone Encounter (Signed)
Patient notified

## 2012-08-26 ENCOUNTER — Telehealth: Payer: Self-pay | Admitting: Cardiology

## 2012-08-26 NOTE — Telephone Encounter (Signed)
C/O weakness in the muscles in the top part of his legs. States his cholesterol med was d/c'd in November by Dr. Diona Browner. States that the weakness and muscle aches are better in his arms and legs, but continue to have problems. He stated that his blood pressure is also Running high.

## 2012-08-30 NOTE — Telephone Encounter (Signed)
Left message to return call 

## 2012-09-02 ENCOUNTER — Other Ambulatory Visit: Payer: Self-pay | Admitting: *Deleted

## 2012-09-02 NOTE — Telephone Encounter (Signed)
Discussed below with patient.  States he has been off the Lipitor for about 3 months now.  Did have labs done with Dr. Sherril Croon around 1/13 for lipids.  I have faxed request to have their office send to [place].  Also, states he has stopped the Norvasc as of 1/31.  States that a couple of days after he takes this medication, he has pain / weakness in the top part of his legs.  Does feel a little better since stopping, but not gone.  Bottom part of legs seem to be okay.  BP readings are as follows:  2/1 - 127/73        - morning           129/73  58 - evening  2/2 - 136/78  52 - morning          150/81 50 - evening  2/3 - 143/81  59 - morning

## 2012-09-02 NOTE — Telephone Encounter (Signed)
Noted. Blood pressure is mildly elevated. We were using Norvasc for blood pressure and as an antianginal. If he feels better off Norvasc, then we may need to consider a different antihypertensive if blood pressure stays up. Perhaps an ARB such as Cozaar could be considered. Will review labs when available.

## 2012-09-05 ENCOUNTER — Ambulatory Visit (INDEPENDENT_AMBULATORY_CARE_PROVIDER_SITE_OTHER): Payer: MEDICARE | Admitting: Cardiology

## 2012-09-05 ENCOUNTER — Encounter: Payer: Self-pay | Admitting: Cardiology

## 2012-09-05 VITALS — BP 134/80 | HR 54 | Ht 68.0 in | Wt 245.0 lb

## 2012-09-05 DIAGNOSIS — I10 Essential (primary) hypertension: Secondary | ICD-10-CM

## 2012-09-05 DIAGNOSIS — Z79899 Other long term (current) drug therapy: Secondary | ICD-10-CM

## 2012-09-05 DIAGNOSIS — E785 Hyperlipidemia, unspecified: Secondary | ICD-10-CM

## 2012-09-05 MED ORDER — ROSUVASTATIN CALCIUM 5 MG PO TABS: ORAL_TABLET | ORAL | Status: DC

## 2012-09-05 NOTE — Assessment & Plan Note (Signed)
Plan at this time will be to attempt Crestor 5 mg every other day. If he tolerates this, followup FLP and LFT in 3 months at which time we will see him back for clinical assessment.

## 2012-09-05 NOTE — Patient Instructions (Signed)
   Begin Crestor 5mg  every other evening Continue all other current medications. Labs for fasting lipid and liver panel - just before next office visit - orders given today Follow up in  3 months

## 2012-09-05 NOTE — Telephone Encounter (Signed)
Patient notified of both.  Would like to have OV to discuss multiple issues.  Scheduled for this afternoon at 1:20 with Dr. Diona Browner.    Notes Recorded by Jonelle Sidle, MD on 09/03/2012 at 3:18 PM Reviewed lab work from Dr. Sherril Croon. Cholesterol up to 222, LDL up to 150 (previously 89 when on Lipitor). Question will be whether he is willing to try something different for lipid management.

## 2012-09-05 NOTE — Assessment & Plan Note (Signed)
Discussed diet measures as well as walking regimen. Will not initiate a new antihypertensive as yet since Crestor will be new and do not want to make it difficult to determine if side effects are from one or the other. Will discuss this further at his next visit.

## 2012-09-05 NOTE — Progress Notes (Signed)
Clinical Summary David Combs is a 67 y.o.male presenting to discuss options for medcial therapy related to management of hypertension and hyperlipidemia. Recent telephone notes reviewed. He states that he stopped Lipitor given concerns about muscle weakness in his arms and legs, also stopped Norvasc as he thought it may also be contributing to weakness in his upper legs. He reports that his muscle symptoms have improved significantly, not completely back to baseline.  He reports prior inability to take simvastatin and pravastatin. He has never tried Crestor.  Home blood pressure records reviewed, systolics in the 130s to 150s. He admits that he has not been exercising. Remains overweight. Today we discussed diet, sodium restriction, and a basic walking regimen.  He tells me he was diagnosed with obstructive sleep apnea since his last visit, not using CPAP.  Allergies  Allergen Reactions  . Iodinated Diagnostic Agents Swelling  . Sulfa Drugs Cross Reactors Itching    Current Outpatient Prescriptions  Medication Sig Dispense Refill  . acetaminophen (TYLENOL) 325 MG tablet Take 650 mg by mouth every 6 (six) hours as needed.        Marland Kitchen aspirin EC 81 MG tablet Take 81 mg by mouth daily.        . clopidogrel (PLAVIX) 75 MG tablet Take 1 tablet (75 mg total) by mouth daily.  90 tablet  3  . fish oil-omega-3 fatty acids 1000 MG capsule Take 2 g by mouth daily.      . Glucosamine-Chondroit-Vit C-Mn (GLUCOSAMINE 1500 COMPLEX PO) Take 1 tablet by mouth.      . metoprolol tartrate (LOPRESSOR) 25 MG tablet Take 1 tablet (25 mg total) by mouth 2 (two) times daily.  180 tablet  3  . nitroGLYCERIN (NITROSTAT) 0.4 MG SL tablet Place 0.4 mg under the tongue every 5 (five) minutes as needed.        . pantoprazole (PROTONIX) 40 MG tablet Take 1 tablet (40 mg total) by mouth 2 (two) times daily.  180 tablet  3  . sildenafil (VIAGRA) 50 MG tablet Take 50 mg by mouth as needed.       . rosuvastatin (CRESTOR) 5 MG  tablet Take one tab by mouth every other evening  15 tablet  6    Past Medical History  Diagnosis Date  . Non-ST elevation myocardial infarction (NSTEMI)     11/11  . Essential hypertension, benign   . Mixed hyperlipidemia   . Obesity   . GERD (gastroesophageal reflux disease)   . ED (erectile dysfunction)   . Dermatophytosis of nail   . Prostatitis, unspecified   . Lumbago   . GERD (gastroesophageal reflux disease)   . Coronary atherosclerosis of native coronary artery     70-80% small diagonal - medically managed, LVEF 55%    Social History Mr. Tallerico reports that he quit smoking about 10 years ago. His smoking use included Cigarettes. He has a 40 pack-year smoking history. He has never used smokeless tobacco. Mr. Whiteford reports that he does not drink alcohol.  Review of Systems Dyspnea on exertion but no chest pain. No palpitations or syncope. No bleeding problems. Otherwise negative except as outlined.  Physical Examination Filed Vitals:   09/05/12 1312  BP: 134/80  Pulse: 54   Filed Weights   09/05/12 1312  Weight: 245 lb (111.131 kg)    HEENT: Conjunctiva and lids normal, oropharynx is moist mucosa.  Neck: Supple, no elevated JVP or carotid bruits.  Lungs: Clear to auscultation, nonlabored  Cardiac: Regular  rate and rhythm, soft systolic murmur at the base, normal second heart sound, no S3.  Abdomen: Soft, nontender, bowel sounds present.  Skin: Warm and dry.  Extremities: No pitting edema, distal pulses full.    Problem List and Plan   Hyperlipemia Plan at this time will be to attempt Crestor 5 mg every other day. If he tolerates this, followup FLP and LFT in 3 months at which time we will see him back for clinical assessment.  Essential hypertension, benign Discussed diet measures as well as walking regimen. Will not initiate a new antihypertensive as yet since Crestor will be new and do not want to make it difficult to determine if side effects are from one  or the other. Will discuss this further at his next visit.    Jonelle Sidle, M.D., F.A.C.C.

## 2012-12-04 ENCOUNTER — Telehealth: Payer: Self-pay | Admitting: *Deleted

## 2012-12-04 NOTE — Telephone Encounter (Signed)
Message copied by Eustace Moore on Wed Dec 04, 2012  2:22 PM ------      Message from: MCDOWELL, Illene Bolus      Created: Wed Dec 04, 2012  8:54 AM       Reviewed. LFTs normal. Cholesterol control is much better, LDL down to 107. If he is tolerating Crestor 5 mg every other day would continue. ------

## 2012-12-05 ENCOUNTER — Encounter: Payer: Self-pay | Admitting: Cardiology

## 2012-12-05 ENCOUNTER — Ambulatory Visit (INDEPENDENT_AMBULATORY_CARE_PROVIDER_SITE_OTHER): Payer: MEDICARE | Admitting: Cardiology

## 2012-12-05 VITALS — BP 134/78 | HR 57 | Ht 68.0 in | Wt 247.0 lb

## 2012-12-05 DIAGNOSIS — I10 Essential (primary) hypertension: Secondary | ICD-10-CM

## 2012-12-05 DIAGNOSIS — I2510 Atherosclerotic heart disease of native coronary artery without angina pectoris: Secondary | ICD-10-CM

## 2012-12-05 DIAGNOSIS — E782 Mixed hyperlipidemia: Secondary | ICD-10-CM

## 2012-12-05 MED ORDER — CLOPIDOGREL BISULFATE 75 MG PO TABS: 75.0000 mg | ORAL_TABLET | Freq: Every day | ORAL | Status: DC

## 2012-12-05 MED ORDER — ROSUVASTATIN CALCIUM 5 MG PO TABS: 5.0000 mg | ORAL_TABLET | Freq: Every day | ORAL | Status: DC

## 2012-12-05 MED ORDER — ROSUVASTATIN CALCIUM 5 MG PO TABS: ORAL_TABLET | ORAL | Status: DC

## 2012-12-05 MED ORDER — METOPROLOL TARTRATE 25 MG PO TABS: 25.0000 mg | ORAL_TABLET | Freq: Two times a day (BID) | ORAL | Status: DC

## 2012-12-05 MED ORDER — AMLODIPINE BESYLATE 2.5 MG PO TABS: 2.5000 mg | ORAL_TABLET | Freq: Every day | ORAL | Status: DC

## 2012-12-05 NOTE — Assessment & Plan Note (Signed)
He is going to try and get Coreg through the [facility] hospital system.

## 2012-12-05 NOTE — Assessment & Plan Note (Signed)
Symptomatically stable, continue current regimen. Followup in 4 months.

## 2012-12-05 NOTE — Telephone Encounter (Signed)
Patient informed. [facility] [place] since crestor was too expensive at [facility].

## 2012-12-05 NOTE — Progress Notes (Signed)
Clinical Summary [name] is a 67 y.o.male last seen in February of this year. At that time we initiated Crestor at 5 mg every other day, and asked him to stay off of Norvasc for the time being. Discussed diet and exercise measures for blood pressure.  Recent lab work reviewed finding normal LFTs, triglycerides 113, cholesterol 182, HDL 52, LDL 107. We discussed this. He states that he took Crestor for a month, but stopped after that due to its cost. He did tolerate it.   He also showed me home blood pressure checks. Blood pressure had trended back up and he decided to try back on Norvasc. He is tolerating it now and his blood pressures are coming back down. Otherwise no new complaints.   Allergies  Allergen Reactions  . Iodinated Diagnostic Agents Swelling  . Sulfa Drugs Cross Reactors Itching    Current Outpatient Prescriptions  Medication Sig Dispense Refill  . acetaminophen (TYLENOL) 325 MG tablet Take 650 mg by mouth every 6 (six) hours as needed.        Marland Kitchen amLODipine (NORVASC) 2.5 MG tablet Take 2.5 mg by mouth daily.      Marland Kitchen aspirin EC 81 MG tablet Take 81 mg by mouth daily.        . clopidogrel (PLAVIX) 75 MG tablet Take 1 tablet (75 mg total) by mouth daily.  90 tablet  3  . fish oil-omega-3 fatty acids 1000 MG capsule Take 2 g by mouth daily.      . Glucosamine-Chondroit-Vit C-Mn (GLUCOSAMINE 1500 COMPLEX PO) Take 1 tablet by mouth 2 (two) times daily.       . metoprolol tartrate (LOPRESSOR) 25 MG tablet Take 1 tablet (25 mg total) by mouth 2 (two) times daily.  180 tablet  3  . nitroGLYCERIN (NITROSTAT) 0.4 MG SL tablet Place 0.4 mg under the tongue every 5 (five) minutes as needed.        . pantoprazole (PROTONIX) 40 MG tablet Take 1 tablet (40 mg total) by mouth 2 (two) times daily.  180 tablet  3  . sildenafil (VIAGRA) 50 MG tablet Take 50 mg by mouth as needed.       . rosuvastatin (CRESTOR) 5 MG tablet Take one tab by mouth every other evening  15 tablet  6   No current  facility-administered medications for this visit.    Past Medical History  Diagnosis Date  . Non-ST elevation myocardial infarction (NSTEMI)     11/11  . Essential hypertension, benign   . Mixed hyperlipidemia   . Obesity   . GERD (gastroesophageal reflux disease)   . ED (erectile dysfunction)   . Dermatophytosis of nail   . Prostatitis, unspecified   . Lumbago   . GERD (gastroesophageal reflux disease)   . Coronary atherosclerosis of native coronary artery     70-80% small diagonal - medically managed, LVEF 55%    Social History [name] that he quit smoking about 10 years ago. His smoking use included Cigarettes. He has a 40 pack-year smoking history. He has never used smokeless tobacco. [name] that he does not drink alcohol.  Review of Systems Negative except as outlined.  Physical Examination Filed Vitals:   12/05/12 1306  BP: 134/78  Pulse: 57   Filed Weights   12/05/12 1306  Weight: 247 lb (112.038 kg)    HEENT: Conjunctiva and lids normal, oropharynx is moist mucosa.  Neck: Supple, no elevated JVP or carotid bruits.  Lungs: Clear  to auscultation, nonlabored  Cardiac: Regular rate and rhythm, soft systolic murmur at the base, normal second heart sound, no S3.  Abdomen: Soft, nontender, bowel sounds present.  Skin: Warm and dry.  Extremities: No pitting edema, distal pulses full.    Problem List and Plan   Coronary atherosclerosis of native coronary artery Symptomatically stable, continue current regimen. Followup in 4 months.  Essential hypertension, benign He is back on low-dose Norvasc, tolerating it so far, blood pressure trend improving.  Hyperlipemia He is going to try and get Coreg through the [facility] hospital system.    Jonelle Sidle, M.D., F.A.C.C.

## 2012-12-05 NOTE — Patient Instructions (Signed)
Your physician recommends that you schedule a follow-up appointment in: 4 months.  Your physician recommends that you continue on your current medications as directed. Please refer to the Current Medication list given to you today.  

## 2012-12-05 NOTE — Assessment & Plan Note (Signed)
He is back on low-dose Norvasc, tolerating it so far, blood pressure trend improving.

## 2012-12-19 ENCOUNTER — Telehealth: Payer: Self-pay | Admitting: Cardiology

## 2012-12-19 NOTE — Telephone Encounter (Signed)
He has had previous statin intolerances, and has been able to actually tolerate Crestor at low dose. Would therefore suggest that we try and continue on that medication. As far as Plavix is concerned, it is not absolutely essential that stay on this long-term. It has been used concurrently with aspirin with his history of medically managed CAD and previous NSTEMI. He has not had a specific PCI that would require him staying on Plavix however.

## 2012-12-19 NOTE — Telephone Encounter (Signed)
Needs to speak with nurse regarding some meds that he had transferred to the [place]. / [facility]

## 2012-12-19 NOTE — Telephone Encounter (Signed)
Patient was informed by the [facility] clinic that he needs a letter provided by his doctor in order to get his crestor, stating why patient has to take crestor vs other statins. Also said he will need a letter stating why needs to stay on plavix. Patient wants to pick up letter on Tuesday to take with him to his appointment.

## 2012-12-20 ENCOUNTER — Encounter: Payer: Self-pay | Admitting: *Deleted

## 2012-12-20 NOTE — Telephone Encounter (Signed)
Patient informed by front staff and is coming to office to pick up letters.

## 2013-01-07 ENCOUNTER — Other Ambulatory Visit: Payer: Self-pay | Admitting: *Deleted

## 2013-01-07 MED ORDER — CLOPIDOGREL BISULFATE 75 MG PO TABS: 75.0000 mg | ORAL_TABLET | Freq: Every day | ORAL | Status: DC

## 2013-02-11 ENCOUNTER — Other Ambulatory Visit: Payer: Self-pay | Admitting: Cardiology

## 2013-02-11 MED ORDER — PANTOPRAZOLE SODIUM 40 MG PO TBEC: 40.0000 mg | DELAYED_RELEASE_TABLET | Freq: Two times a day (BID) | ORAL | Status: DC

## 2013-02-13 ENCOUNTER — Encounter: Payer: Self-pay | Admitting: Cardiology

## 2013-03-30 ENCOUNTER — Encounter: Payer: Self-pay | Admitting: Cardiology

## 2013-04-16 ENCOUNTER — Ambulatory Visit: Payer: MEDICARE | Admitting: Cardiology

## 2013-04-24 ENCOUNTER — Ambulatory Visit (INDEPENDENT_AMBULATORY_CARE_PROVIDER_SITE_OTHER): Payer: MEDICARE | Admitting: Cardiology

## 2013-04-24 ENCOUNTER — Encounter: Payer: Self-pay | Admitting: Cardiology

## 2013-04-24 VITALS — BP 142/89 | HR 53 | Ht 67.0 in | Wt 243.1 lb

## 2013-04-24 DIAGNOSIS — E785 Hyperlipidemia, unspecified: Secondary | ICD-10-CM

## 2013-04-24 DIAGNOSIS — I2510 Atherosclerotic heart disease of native coronary artery without angina pectoris: Secondary | ICD-10-CM

## 2013-04-24 NOTE — Assessment & Plan Note (Signed)
Symptomatically stable. Plan to continue medical therapy and observation. Followup arranged.

## 2013-04-24 NOTE — Patient Instructions (Addendum)
Your physician recommends that you schedule a follow-up appointment in: 6 months. You will receive a reminder letter in the mail in about 4 months reminding you to call and schedule your appointment. If you don't receive this letter, please contact our office. Your physician recommends that you continue on your current medications as directed. Please refer to the Current Medication list given to you today. Your physician recommends that you return for a FASTING lipid/liver profile in 6 months. You will receive a reminder letter in the mail in about 4 months reminding you to have this done. If you don't receive this letter, please contact our office. Please use [facility] ( see below)    [address] (across from [employer]) M-F 7:00 am to 6:00 pm  Sat 8:00 am to 12:00

## 2013-04-24 NOTE — Assessment & Plan Note (Signed)
Lipids are coming under better control, LDL has come down to 107. Continue Crestor at current dose, recheck FLP and LFTs for next visit.

## 2013-04-24 NOTE — Addendum Note (Signed)
Addended by: Eustace Moore on: 04/24/2013 10:12 AM   Modules accepted: Orders

## 2013-04-24 NOTE — Progress Notes (Signed)
Clinical Summary David Combs is a 68 y.o.male last seen in May. He reports no progressive symptomatology from a cardiac perspective. Has occasional atypical chest pain, has not been using nitroglycerin.  Record review finds recent evaluation at [facility] secondary to prostatitis and UTI. Symptoms improved.  Lab work from May showed normal LFTs, triglycerides 113, cholesterol 182, HDL 52, LDL 107. He has tolerated Crestor, continues on 5 mg daily. He has been able to get this through the [facility] system.   Allergies  Allergen Reactions  . Iodinated Diagnostic Agents Swelling  . Sulfa Drugs Cross Reactors Itching    Current Outpatient Prescriptions  Medication Sig Dispense Refill  . acetaminophen (TYLENOL) 325 MG tablet Take 650 mg by mouth every 6 (six) hours as needed.        Marland Kitchen amLODipine (NORVASC) 5 MG tablet Take 2.5 mg by mouth daily.      Marland Kitchen aspirin EC 81 MG tablet Take 81 mg by mouth daily.        Marland Kitchen buPROPion (WELLBUTRIN SR) 200 MG 12 hr tablet Take 200 mg by mouth every morning.      . clopidogrel (PLAVIX) 75 MG tablet Take 1 tablet (75 mg total) by mouth daily.  90 tablet  3  . fish oil-omega-3 fatty acids 1000 MG capsule Take 2 g by mouth daily.      . Glucosamine-Chondroit-Vit C-Mn (GLUCOSAMINE 1500 COMPLEX PO) Take 1 tablet by mouth 2 (two) times daily.       . metoprolol tartrate (LOPRESSOR) 25 MG tablet Take 1 tablet (25 mg total) by mouth 2 (two) times daily.  180 tablet  3  . pantoprazole (PROTONIX) 40 MG tablet Take 1 tablet (40 mg total) by mouth 2 (two) times daily.  180 tablet  3  . rosuvastatin (CRESTOR) 10 MG tablet Take 10 mg by mouth daily.      . sildenafil (VIAGRA) 50 MG tablet Take 50 mg by mouth as needed.       . nitroGLYCERIN (NITROSTAT) 0.4 MG SL tablet Place 0.4 mg under the tongue every 5 (five) minutes as needed.         No current facility-administered medications for this visit.    Past Medical History  Diagnosis Date  . Non-ST elevation  myocardial infarction (NSTEMI)     11/11  . Essential hypertension, benign   . Mixed hyperlipidemia   . Obesity   . GERD (gastroesophageal reflux disease)   . ED (erectile dysfunction)   . Dermatophytosis of nail   . Prostatitis, unspecified   . Lumbago   . GERD (gastroesophageal reflux disease)   . Coronary atherosclerosis of native coronary artery     70-80% small diagonal - medically managed, LVEF 55%    Social History David Combs reports that he quit smoking about 10 years ago. His smoking use included Cigarettes. He has a 40 pack-year smoking history. He has never used smokeless tobacco. David Combs reports that he does not drink alcohol.  Review of Systems No palpitations or syncope. No bleeding problems. Stable appetite. Otherwise negative except as outlined.  Physical Examination Filed Vitals:   04/24/13 0940  BP: 142/89  Pulse: 53   Filed Weights   04/24/13 0940  Weight: 243 lb 1.9 oz (110.279 kg)    Appears comfortable. HEENT: Conjunctiva and lids normal, oropharynx is moist mucosa.  Neck: Supple, no elevated JVP or carotid bruits.  Lungs: Clear to auscultation, nonlabored  Cardiac: Regular rate and rhythm, soft systolic murmur at  the base, normal second heart sound, no S3.  Abdomen: Soft, nontender, bowel sounds present.  Skin: Warm and dry.  Extremities: No pitting edema, distal pulses full.    Problem List and Plan   Coronary atherosclerosis of native coronary artery Symptomatically stable. Plan to continue medical therapy and observation. Followup arranged.  Hyperlipemia Lipids are coming under better control, LDL has come down to 107. Continue Crestor at current dose, recheck FLP and LFTs for next visit.    Jonelle Sidle, M.D., F.A.C.C.

## 2013-05-01 ENCOUNTER — Telehealth: Payer: Self-pay | Admitting: Cardiology

## 2013-05-01 NOTE — Telephone Encounter (Signed)
Agree that monitoring blood pressure will be important. Prazosin can also serve as an antihypertensive medication and may have additive effects in the setting of other antihypertensives.

## 2013-05-01 NOTE — Telephone Encounter (Signed)
Patient informed and verbalized understanding of plan. 

## 2013-05-01 NOTE — Telephone Encounter (Signed)
Called patient and he is concerned about the prazosin having interactions with other medications. Patient said his paperwork from pharmacy states prazosin has interaction with metoprolol. Interactions check and nurse informed patient that he should monitor his BP while on this medication since it can lower your BP. Also informed patient that MD would be informed and if any suggestions made, patient would be contacted with recommendations.

## 2013-05-01 NOTE — Telephone Encounter (Signed)
David Combs went to see his psychiatrist and was given medication Prazosin HCL 2mg  (for nightmares and dreams) He is concerned about the side effects and his heart medications.

## 2013-07-07 ENCOUNTER — Telehealth: Payer: Self-pay | Admitting: *Deleted

## 2013-07-07 NOTE — Telephone Encounter (Signed)
Called patient to inform him that he's not due for labs and appointment until end of March 2015. Patient verbalized understanding. While on the phone with patient he states that he's experienced some chest discomfort the past couple of days. Nurse asked patient if he used his nitroglycerin and patient said no. Nurse advised patient to use his nitro as prescribed as needed and call our office if no relief to schedule an ov.

## 2013-08-29 ENCOUNTER — Ambulatory Visit: Payer: MEDICARE | Admitting: Cardiology

## 2013-10-14 ENCOUNTER — Other Ambulatory Visit: Payer: Self-pay | Admitting: *Deleted

## 2013-10-14 DIAGNOSIS — E785 Hyperlipidemia, unspecified: Secondary | ICD-10-CM

## 2013-10-14 DIAGNOSIS — Z79899 Other long term (current) drug therapy: Secondary | ICD-10-CM

## 2013-10-14 NOTE — Progress Notes (Signed)
FAXED TO [facility] PER PATIENT REQUEST

## 2013-10-20 ENCOUNTER — Telehealth: Payer: Self-pay | Admitting: *Deleted

## 2013-10-20 NOTE — Telephone Encounter (Signed)
Message copied by Merlene Laughter on Mon Oct 20, 2013  8:58 AM ------      Message from: MCDOWELL, Aloha Gell      Created: Thu Oct 16, 2013 11:49 AM       Reviewed. AST and ALT are normal. Cholesterol 161, LDL has come down to 90. Continue same. ------

## 2013-10-20 NOTE — Telephone Encounter (Signed)
Patient informed. 

## 2013-10-22 ENCOUNTER — Encounter: Payer: Self-pay | Admitting: Cardiology

## 2013-10-22 ENCOUNTER — Ambulatory Visit: Payer: MEDICARE | Admitting: Cardiology

## 2013-10-22 ENCOUNTER — Ambulatory Visit (INDEPENDENT_AMBULATORY_CARE_PROVIDER_SITE_OTHER): Payer: MEDICARE | Admitting: Cardiology

## 2013-10-22 VITALS — BP 129/80 | HR 47 | Ht 68.0 in | Wt 243.0 lb

## 2013-10-22 DIAGNOSIS — E785 Hyperlipidemia, unspecified: Secondary | ICD-10-CM

## 2013-10-22 DIAGNOSIS — I10 Essential (primary) hypertension: Secondary | ICD-10-CM

## 2013-10-22 DIAGNOSIS — Z79899 Other long term (current) drug therapy: Secondary | ICD-10-CM

## 2013-10-22 DIAGNOSIS — E669 Obesity, unspecified: Secondary | ICD-10-CM

## 2013-10-22 DIAGNOSIS — I2510 Atherosclerotic heart disease of native coronary artery without angina pectoris: Secondary | ICD-10-CM

## 2013-10-22 NOTE — Assessment & Plan Note (Signed)
Recent lab work reviewed, HDL 56 and LDL 90. Continue current regimen.

## 2013-10-22 NOTE — Patient Instructions (Signed)
Your physician recommends that you schedule a follow-up appointment in: 6 months with Dr. Domenic Polite. You should receive a letter in the mail in 4 months. If you do not receive this letter by July 2015 call our office to schedule this appointment.   Your physician recommends that you continue on your current medications as directed. Please refer to the Current Medication list given to you today.  Your physician recommends that you return for lab work in: 6 months just before your appointment with Dr. Domenic Polite.  You may go to one of the following locations to have lab work completed: [facility] [address] Dr. Edrick Oh office in [facility].

## 2013-10-22 NOTE — Assessment & Plan Note (Signed)
We did discuss weight loss today.

## 2013-10-22 NOTE — Assessment & Plan Note (Signed)
Continue medical therapy. We have kept him on DAPT which he has tolerated well. Encouraged regular exercise plan.

## 2013-10-22 NOTE — Assessment & Plan Note (Signed)
No change to current regimen. 

## 2013-10-22 NOTE — Progress Notes (Signed)
Clinical Summary David Combs is a 68 y.o.male last seen in September 2014. He reports no definite progression in angina symptoms on medical therapy. Chronic leg pain for several years, also fatigue. No bleeding problems on DAPT.   Recent lab work showed normal AST and ALT, cholesterol 161, triglycerides 73, HDL 56, and LDL 90. He continues on Crestor and omega-3 supplements.  Exercise Cardiolite from April 2012 showed no diagnostic ST segment changes, evidence of previous apical infarct with no active ischemia, LVEF 62%.  Review of home blood pressure checks shows systolics largely running in the high 110s to 120s, some outliers in the 140s to 150s.  He continues to follow at the [facility].   Allergies  Allergen Reactions  . Iodinated Diagnostic Agents Swelling  . Sulfa Drugs Cross Reactors Itching    Current Outpatient Prescriptions  Medication Sig Dispense Refill  . acetaminophen (TYLENOL) 325 MG tablet Take 650 mg by mouth every 6 (six) hours as needed.        Marland Kitchen amLODipine (NORVASC) 5 MG tablet Take 2.5 mg by mouth daily.      Marland Kitchen aspirin EC 81 MG tablet Take 81 mg by mouth daily.        Marland Kitchen buPROPion (WELLBUTRIN SR) 200 MG 12 hr tablet Take 200 mg by mouth every morning.      . clopidogrel (PLAVIX) 75 MG tablet Take 1 tablet (75 mg total) by mouth daily.  90 tablet  3  . fish oil-omega-3 fatty acids 1000 MG capsule Take 2 g by mouth daily.      . Glucosamine-Chondroit-Vit C-Mn (GLUCOSAMINE 1500 COMPLEX PO) Take 1 tablet by mouth 2 (two) times daily.       . metoprolol tartrate (LOPRESSOR) 25 MG tablet Take 1 tablet (25 mg total) by mouth 2 (two) times daily.  180 tablet  3  . pantoprazole (PROTONIX) 40 MG tablet Take 1 tablet (40 mg total) by mouth 2 (two) times daily.  180 tablet  3  . prazosin (MINIPRESS) 2 MG capsule Take 2 mg by mouth at bedtime.      . rosuvastatin (CRESTOR) 10 MG tablet Take 10 mg by mouth daily.      . sildenafil (VIAGRA) 50 MG tablet Take  50 mg by mouth as needed.        No current facility-administered medications for this visit.    Past Medical History  Diagnosis Date  . Non-ST elevation myocardial infarction (NSTEMI)     11/11  . Essential hypertension, benign   . Mixed hyperlipidemia   . Obesity   . GERD (gastroesophageal reflux disease)   . ED (erectile dysfunction)   . Dermatophytosis of nail   . Prostatitis, unspecified   . Lumbago   . GERD (gastroesophageal reflux disease)   . Coronary atherosclerosis of native coronary artery     70-80% small diagonal - medically managed, LVEF 55%    Social History David Combs reports that he quit smoking about 11 years ago. His smoking use included Cigarettes. He has a 40 pack-year smoking history. He has never used smokeless tobacco. David Combs reports that he does not drink alcohol.  Review of Systems No palpitations or syncope. No definite claudication. Otherwise as outlined above.  Physical Examination Filed Vitals:   10/22/13 1015  BP: 129/80  Pulse: 47   Filed Weights   10/22/13 1015  Weight: 243 lb (110.224 kg)    Appears comfortable.  HEENT: Conjunctiva and lids normal, oropharynx is  moist mucosa.  Neck: Supple, no elevated JVP or carotid bruits.  Lungs: Clear to auscultation, nonlabored  Cardiac: Regular rate and rhythm, soft systolic murmur at the base, normal second heart sound, no S3.  Abdomen: Soft, nontender, bowel sounds present.  Skin: Warm and dry.  Extremities: No pitting edema, distal pulses full.    Problem List and Plan   Coronary atherosclerosis of native coronary artery Continue medical therapy. We have kept him on DAPT which he has tolerated well. Encouraged regular exercise plan.  Hyperlipemia Recent lab work reviewed, HDL 56 and LDL 90. Continue current regimen.  Obesity We did discuss weight loss today.  Essential hypertension, benign No change to current regimen.    Satira Sark, M.D., F.A.C.C.

## 2014-01-19 ENCOUNTER — Other Ambulatory Visit: Payer: Self-pay | Admitting: *Deleted

## 2014-01-19 ENCOUNTER — Telehealth: Payer: Self-pay | Admitting: *Deleted

## 2014-01-19 DIAGNOSIS — I2510 Atherosclerotic heart disease of native coronary artery without angina pectoris: Secondary | ICD-10-CM

## 2014-01-19 DIAGNOSIS — Z79899 Other long term (current) drug therapy: Secondary | ICD-10-CM

## 2014-01-19 DIAGNOSIS — E785 Hyperlipidemia, unspecified: Secondary | ICD-10-CM

## 2014-01-19 NOTE — Telephone Encounter (Signed)
Message copied by Merlene Laughter on Mon Jan 19, 2014  1:45 PM ------      Message from: Delfino Lovett T      Created: Wed Jan 07, 2014 11:16 AM      Regarding: NEEDS ORDERS WRITTEN FOR LABS        Liver and lipid labs to be done right before 6 month appointment      Has appt. In Sept. Please also contact patient.  ------

## 2014-01-19 NOTE — Telephone Encounter (Signed)
Orders mailed to patient's home address.

## 2014-02-17 ENCOUNTER — Other Ambulatory Visit: Payer: Self-pay | Admitting: *Deleted

## 2014-02-17 MED ORDER — CLOPIDOGREL BISULFATE 75 MG PO TABS: 75.0000 mg | ORAL_TABLET | Freq: Every day | ORAL | Status: DC

## 2014-04-13 ENCOUNTER — Telehealth: Payer: Self-pay | Admitting: *Deleted

## 2014-04-13 NOTE — Telephone Encounter (Signed)
Pt notified of results

## 2014-04-15 ENCOUNTER — Other Ambulatory Visit: Payer: Self-pay | Admitting: *Deleted

## 2014-04-15 ENCOUNTER — Ambulatory Visit (INDEPENDENT_AMBULATORY_CARE_PROVIDER_SITE_OTHER): Payer: MEDICARE | Admitting: Cardiology

## 2014-04-15 ENCOUNTER — Encounter: Payer: Self-pay | Admitting: Cardiology

## 2014-04-15 VITALS — BP 129/81 | HR 50 | Ht 67.0 in | Wt 238.0 lb

## 2014-04-15 DIAGNOSIS — I10 Essential (primary) hypertension: Secondary | ICD-10-CM

## 2014-04-15 DIAGNOSIS — I2510 Atherosclerotic heart disease of native coronary artery without angina pectoris: Secondary | ICD-10-CM

## 2014-04-15 DIAGNOSIS — E785 Hyperlipidemia, unspecified: Secondary | ICD-10-CM

## 2014-04-15 MED ORDER — AMLODIPINE BESYLATE 5 MG PO TABS: 2.5000 mg | ORAL_TABLET | Freq: Every day | ORAL | Status: DC

## 2014-04-15 MED ORDER — ROSUVASTATIN CALCIUM 10 MG PO TABS: 10.0000 mg | ORAL_TABLET | Freq: Every day | ORAL | Status: DC

## 2014-04-15 MED ORDER — PANTOPRAZOLE SODIUM 40 MG PO TBEC: 40.0000 mg | DELAYED_RELEASE_TABLET | Freq: Two times a day (BID) | ORAL | Status: DC

## 2014-04-15 MED ORDER — METOPROLOL TARTRATE 25 MG PO TABS: 25.0000 mg | ORAL_TABLET | Freq: Two times a day (BID) | ORAL | Status: DC

## 2014-04-15 NOTE — Progress Notes (Signed)
Clinical Summary David Combs is a 68 y.o.male last seen in March. He reports no angina symptoms. Plays golf at least once a week. He has been having trouble with shoulder pain and has been getting injections for pain control.  Recent labwork showed normal AST and ALT, triglycerides 118, cholesterol 242, HDL 63, and LDL 155 - previously 90 in March. We reviewed his medications. He tells me that he stopped Crestor back in August for several weeks to see if this was leading to any muscle soreness, and there was no improvement in symptoms. He plans to go back on the medication.  Exercise Cardiolite from April 2012 showed no diagnostic ST segment changes, evidence of previous apical infarct with no active ischemia, LVEF 62%.  He has been on DAPT since 2011. We discussed stopping Plavix and continuing aspirin at this point.   Allergies  Allergen Reactions  . Iodinated Diagnostic Agents Swelling  . Sulfa Drugs Cross Reactors Itching    Current Outpatient Prescriptions  Medication Sig Dispense Refill  . acetaminophen (TYLENOL) 325 MG tablet Take 650 mg by mouth every 6 (six) hours as needed.        Marland Kitchen amLODipine (NORVASC) 5 MG tablet Take 2.5 mg by mouth daily.      Marland Kitchen aspirin EC 81 MG tablet Take 81 mg by mouth daily.        . fish oil-omega-3 fatty acids 1000 MG capsule Take 2 g by mouth daily.      . Glucosamine-Chondroit-Vit C-Mn (GLUCOSAMINE 1500 COMPLEX PO) Take 1 tablet by mouth 2 (two) times daily.       . metoprolol tartrate (LOPRESSOR) 25 MG tablet Take 1 tablet (25 mg total) by mouth 2 (two) times daily.  180 tablet  3  . pantoprazole (PROTONIX) 40 MG tablet Take 1 tablet (40 mg total) by mouth 2 (two) times daily.  180 tablet  3  . rosuvastatin (CRESTOR) 10 MG tablet Take 10 mg by mouth daily. Take 1/2 daily      . sildenafil (VIAGRA) 50 MG tablet Take 50 mg by mouth as needed.       . traZODone (DESYREL) 150 MG tablet Take 150 mg by mouth at bedtime.       No current  facility-administered medications for this visit.    Past Medical History  Diagnosis Date  . Non-ST elevation myocardial infarction (NSTEMI)     11/11  . Essential hypertension, benign   . Mixed hyperlipidemia   . Obesity   . GERD (gastroesophageal reflux disease)   . ED (erectile dysfunction)   . Dermatophytosis of nail   . Prostatitis, unspecified   . Lumbago   . GERD (gastroesophageal reflux disease)   . Coronary atherosclerosis of native coronary artery     70-80% small diagonal - medically managed, LVEF 55%    Social History David Combs reports that he quit smoking about 11 years ago. His smoking use included Cigarettes. He has a 40 pack-year smoking history. He has never used smokeless tobacco. David Combs reports that he does not drink alcohol.  Review of Systems Other systems reviewed and negative except as outlined.  Physical Examination Filed Vitals:   04/15/14 0811  BP: 129/81  Pulse: 50   Filed Weights   04/15/14 0811  Weight: 238 lb (107.956 kg)    Appears comfortable.  HEENT: Conjunctiva and lids normal, oropharynx is moist mucosa.  Neck: Supple, no elevated JVP or carotid bruits.  Lungs: Clear to auscultation, nonlabored  Cardiac: Regular rate and rhythm, soft systolic murmur at the base, normal second heart sound, no S3.  Abdomen: Soft, nontender, bowel sounds present.  Skin: Warm and dry.  Extremities: No pitting edema, distal pulses full.    Problem List and Plan   Coronary atherosclerosis of native coronary artery Symptomatically stable. Continue current regimen except stop Plavix. We will see him back in 6 months.  Hyperlipemia Plans to resume Crestor, we reviewed the above lipid numbers. Recheck FLP and LFT for next visit.  Essential hypertension, benign Blood pressure control is reasonable today.    Satira Sark, M.D., F.A.C.C.

## 2014-04-15 NOTE — Patient Instructions (Signed)
Your physician recommends that you schedule a follow-up appointment in: 6 months. You will receive a reminder letter in the mail in about 4 months reminding you to call and schedule your appointment. If you don't receive this letter, please contact our office. Your physician has recommended you make the following change in your medication:  Stop plavix. Continue all other medications the same. Your physician recommends that you have a FASTING lipid/liver profile in 6 months just before your next visit. We will notify you of this when its due.

## 2014-04-15 NOTE — Assessment & Plan Note (Signed)
Plans to resume Crestor, we reviewed the above lipid numbers. Recheck FLP and LFT for next visit.

## 2014-04-15 NOTE — Telephone Encounter (Signed)
Prescriptions faxed to [facility].

## 2014-04-15 NOTE — Assessment & Plan Note (Signed)
Blood pressure control is reasonable today. 

## 2014-04-15 NOTE — Assessment & Plan Note (Signed)
Symptomatically stable. Continue current regimen except stop Plavix. We will see him back in 6 months.

## 2014-05-31 HISTORY — PX: CHOLECYSTECTOMY OPEN: SUR202

## 2014-10-13 ENCOUNTER — Ambulatory Visit (INDEPENDENT_AMBULATORY_CARE_PROVIDER_SITE_OTHER): Payer: MEDICARE | Admitting: Cardiology

## 2014-10-13 ENCOUNTER — Encounter: Payer: Self-pay | Admitting: Cardiology

## 2014-10-13 VITALS — BP 122/92 | HR 60 | Ht 67.0 in | Wt 243.0 lb

## 2014-10-13 DIAGNOSIS — I739 Peripheral vascular disease, unspecified: Secondary | ICD-10-CM

## 2014-10-13 DIAGNOSIS — E782 Mixed hyperlipidemia: Secondary | ICD-10-CM | POA: Diagnosis not present

## 2014-10-13 DIAGNOSIS — K219 Gastro-esophageal reflux disease without esophagitis: Secondary | ICD-10-CM

## 2014-10-13 DIAGNOSIS — I2510 Atherosclerotic heart disease of native coronary artery without angina pectoris: Secondary | ICD-10-CM | POA: Diagnosis not present

## 2014-10-13 DIAGNOSIS — I10 Essential (primary) hypertension: Secondary | ICD-10-CM

## 2014-10-13 MED ORDER — AMLODIPINE BESYLATE 5 MG PO TABS: 2.5000 mg | ORAL_TABLET | Freq: Every day | ORAL | Status: DC

## 2014-10-13 MED ORDER — METOPROLOL TARTRATE 25 MG PO TABS: 25.0000 mg | ORAL_TABLET | Freq: Two times a day (BID) | ORAL | Status: DC

## 2014-10-13 MED ORDER — ROSUVASTATIN CALCIUM 10 MG PO TABS: 5.0000 mg | ORAL_TABLET | Freq: Every day | ORAL | Status: DC

## 2014-10-13 MED ORDER — PANTOPRAZOLE SODIUM 40 MG PO TBEC: 40.0000 mg | DELAYED_RELEASE_TABLET | Freq: Two times a day (BID) | ORAL | Status: DC

## 2014-10-13 NOTE — Patient Instructions (Signed)
Your physician recommends that you schedule a follow-up appointment in: 6 months. You will receive a reminder letter in the mail in about 4 months reminding you to call and schedule your appointment. If you don't receive this letter, please contact our office. Your physician recommends that you continue on your current medications as directed. Please refer to the Current Medication list given to you today. Your physician has requested that you have Lower extremity arterial doppler with an ankle brachial index (ABI). During this test an ultrasound and blood pressure cuff are used to evaluate the arteries that supply the arms and legs with blood. Allow thirty minutes for this exam. There are no restrictions or special instructions. You have been referred to Dr. Laural Golden.

## 2014-10-13 NOTE — Progress Notes (Signed)
Cardiology Office Note  Date: 10/13/2014   ID: David Combs, DOB 04-12-1946, MRN [id number]  PCP: Glenda Chroman., MD  Primary Cardiologist: Rozann Lesches, MD   Chief Complaint  Patient presents with  . Coronary Artery Disease  . Hyperlipidemia  . Hypertension    History of Present Illness: David Combs is a 69 y.o. male last seen in September 2015. He presents for a routine visit today. He has not reported any significant exertional angina or nitroglycerin use. Does complaint of experiencing a lower central chest discomfort in the mornings after he gets up and stands up, this sensation goes away within 30 minutes of taking his morning medications which include Protonix. He had his gallbladder out at [facility] back in November 2015. He does state that he has had worsening reflux since then, water brash at nighttime although this is been less problematic recently. No dysphagia.  He is getting back to his regular golf, usually plays a few times a week. He has noticed some fatigue in his legs when he walks and a tightness in his thighs.  We reviewed his medications, he has been back on Crestor since our last visit, and his lipid numbers look much better as detailed below.   Past Medical History  Diagnosis Date  . Non-ST elevation myocardial infarction (NSTEMI)     11/11  . Essential hypertension, benign   . Mixed hyperlipidemia   . Obesity   . GERD (gastroesophageal reflux disease)   . ED (erectile dysfunction)   . Dermatophytosis of nail   . Prostatitis, unspecified   . Lumbago   . GERD (gastroesophageal reflux disease)   . Coronary atherosclerosis of native coronary artery     70-80% small diagonal - medically managed, LVEF 55%    Past Surgical History  Procedure Laterality Date  . Cervical spine surgery    . Right knee surgery    . Appendectomy    . Tonsillectomy  AGE 77  . Left wrist fracture surgery  1994    Current Outpatient Prescriptions    Medication Sig Dispense Refill  . acetaminophen (TYLENOL) 325 MG tablet Take 650 mg by mouth every 6 (six) hours as needed.      Marland Kitchen amLODipine (NORVASC) 5 MG tablet Take 2.5 mg by mouth daily.    Marland Kitchen aspirin EC 81 MG tablet Take 81 mg by mouth daily.      Marland Kitchen buPROPion (ZYBAN) 150 MG 12 hr tablet Take 150 mg by mouth daily.    . fish oil-omega-3 fatty acids 1000 MG capsule Take 2 g by mouth daily.    Marland Kitchen loratadine (CLARITIN) 10 MG tablet Take 10 mg by mouth daily.    . metoprolol tartrate (LOPRESSOR) 25 MG tablet Take 1 tablet (25 mg total) by mouth 2 (two) times daily. 180 tablet 3  . pantoprazole (PROTONIX) 40 MG tablet Take 1 tablet (40 mg total) by mouth 2 (two) times daily. (Patient taking differently: Take 40 mg by mouth daily. ) 180 tablet 3  . rosuvastatin (CRESTOR) 10 MG tablet Take 2.5 mg by mouth daily.    . sildenafil (VIAGRA) 50 MG tablet Take 50 mg by mouth as needed.     . traZODone (DESYREL) 150 MG tablet Take 150 mg by mouth at bedtime.     No current facility-administered medications for this visit.    Allergies:  Iodinated diagnostic agents and Sulfa drugs cross reactors   Social History: The patient  reports that he quit smoking  about 12 years ago. His smoking use included Cigarettes. He has a 40 pack-year smoking history. He has never used smokeless tobacco. He reports that he does not drink alcohol or use illicit drugs.    ROS:  Please see the history of present illness. Otherwise, complete review of systems is positive for none.  All other systems are reviewed and negative.    Physical Exam: VS:  BP 122/92 mmHg  Pulse 60  Ht 5\' 7"  (1.702 m)  Wt 243 lb (110.224 kg)  BMI 38.05 kg/m2  SpO2 98%, BMI Body mass index is 38.05 kg/(m^2).  Wt Readings from Last 3 Encounters:  10/13/14 243 lb (110.224 kg)  04/15/14 238 lb (107.956 kg)  10/22/13 243 lb (110.224 kg)     Obese male, appears comfortable.  HEENT: Conjunctiva and lids normal, oropharynx is moist mucosa.   Neck: Supple, no elevated JVP or carotid bruits.  Lungs: Clear to auscultation, nonlabored  Cardiac: Regular rate and rhythm, soft systolic murmur at the base, normal second heart sound, no S3.  Abdomen: Soft, nontender, bowel sounds present.  Skin: Warm and dry.  Extremities: No pitting edema, distal pulses 2+.  Musculoskeletal: No kyphosis. Neuropsychiatric: Alert and oriented 3, affect appropriate.   ECG: ECG is not ordered today.   Recent Labwork:  Lab work from September 2015 showed AST 20, ALT 17, cholesterol 242, triglycerides 118, HDL 63, LDL 155. Follow-up lab work in January 2016 showed potassium 5.1, BUN 16, creatinine 0.9, SGOT 10, SGPT 25, cholesterol 179, triglycerides 80, HDL 73, and LDL 90.  Other Studies Reviewed Today:  Exercise Cardiolite from April 2012 showed no diagnostic ST segment changes, evidence of previous apical infarct with no active ischemia, LVEF 62%.  Assessment and Plan:  1. Branch vessel CAD as outlined above, symptomatically stable on medical therapy. Continue observation for now.  2. Mixed hyperlipidemia, much better controlled having resumed low-dose Crestor. Recent LDL 90 and HDL 73.  3. Leg fatigue and thigh tightness. Possibly claudication, lower extremity arterial Dopplers will be obtained.  4. Increased reflux symptoms and morning chest discomfort after arising from bed. He is on Protonix which he takes in the mornings, symptoms are usually better in 30 minutes. Requesting referral to gastroenterology.  5. Hypertension, no change to current regimen. Have encouraged weight loss and regular activity.  Current medicines are reviewed at length with the patient today.  The patient does not have concerns regarding medicines.   Orders Placed This Encounter  Procedures  . Ambulatory referral to Gastroenterology  . Lower Extremity Arterial Doppler Bilateral    Disposition: FU with me in 6 months.   Signed, Satira Sark,  MD, [facility] 10/13/2014 10:00 AM    [facility] [place], [place], [place] [address] Phone: [phone]; Fax: [phone]

## 2014-10-15 ENCOUNTER — Encounter (INDEPENDENT_AMBULATORY_CARE_PROVIDER_SITE_OTHER): Payer: Self-pay | Admitting: *Deleted

## 2014-10-28 ENCOUNTER — Ambulatory Visit (INDEPENDENT_AMBULATORY_CARE_PROVIDER_SITE_OTHER): Payer: MEDICARE

## 2014-10-28 DIAGNOSIS — I739 Peripheral vascular disease, unspecified: Secondary | ICD-10-CM | POA: Diagnosis not present

## 2014-11-03 ENCOUNTER — Encounter (INDEPENDENT_AMBULATORY_CARE_PROVIDER_SITE_OTHER): Payer: Self-pay | Admitting: Internal Medicine

## 2014-11-03 ENCOUNTER — Ambulatory Visit (INDEPENDENT_AMBULATORY_CARE_PROVIDER_SITE_OTHER): Payer: MEDICARE | Admitting: Internal Medicine

## 2014-11-03 VITALS — BP 110/62 | Temp 98.1°F | Resp 64 | Ht 67.0 in | Wt 243.6 lb

## 2014-11-03 DIAGNOSIS — K219 Gastro-esophageal reflux disease without esophagitis: Secondary | ICD-10-CM | POA: Diagnosis not present

## 2014-11-03 DIAGNOSIS — I2510 Atherosclerotic heart disease of native coronary artery without angina pectoris: Secondary | ICD-10-CM

## 2014-11-03 NOTE — Patient Instructions (Signed)
EGD. The risks and benefits such as perforation, bleeding, and infection were reviewed with the patient and is agreeable. 

## 2014-11-03 NOTE — Progress Notes (Signed)
Subjective:    Patient ID: David Combs, male    DOB: [birth year], 69 y.o.   MRN: [id number]  HPI Referred to our office by Dr. Domenic Polite for epigastric region and rt upper quadrant. Underwent a cholecystectomy in November at [facility] for rt upper quadrant and epigastric pain (same pain). He tells me he has acid reflux. He says he wakes up in the morning and he has epigastric pain. He can take a Protonix and the pain is relieved.  He has doubled his Protonix to twice a day. He usually has the pain daily in the am. He was having the pain all day till he started taking the Protonix BID.  Hx of colon polyps. Appetite is good. No weight loss. Usually has a BM every other day. No melena or BRRB.  10/26/2013 H and H 15.5 and 45.6  Review of Systems Married. Two children in good health.     Past Medical History  Diagnosis Date  . Non-ST elevation myocardial infarction (NSTEMI)     11/11  . Essential hypertension, benign   . Mixed hyperlipidemia   . Obesity   . GERD (gastroesophageal reflux disease)   . ED (erectile dysfunction)   . Dermatophytosis of nail   . Prostatitis, unspecified   . Lumbago   . GERD (gastroesophageal reflux disease)   . Coronary atherosclerosis of native coronary artery     70-80% small diagonal - medically managed, LVEF 55%    Past Surgical History  Procedure Laterality Date  . Cervical spine surgery    . Right knee surgery    . Appendectomy    . Tonsillectomy  AGE 59  . Left wrist fracture surgery  1994  . Cholecystectomy      05/2014. rt upper quadrant pain     Allergies  Allergen Reactions  . Iodinated Diagnostic Agents Swelling  . Sulfa Drugs Cross Reactors Itching    Current Outpatient Prescriptions on File Prior to Visit  Medication Sig Dispense Refill  . acetaminophen (TYLENOL) 325 MG tablet Take 650 mg by mouth every 6 (six) hours as needed.      Marland Kitchen amLODipine (NORVASC) 5 MG tablet Take 0.5 tablets (2.5 mg total) by mouth daily. 45 tablet 3  .  aspirin EC 81 MG tablet Take 81 mg by mouth daily.      Marland Kitchen buPROPion (ZYBAN) 150 MG 12 hr tablet Take 150 mg by mouth daily.    . fish oil-omega-3 fatty acids 1000 MG capsule Take 2 g by mouth daily.    Marland Kitchen loratadine (CLARITIN) 10 MG tablet Take 10 mg by mouth daily.    . metoprolol tartrate (LOPRESSOR) 25 MG tablet Take 1 tablet (25 mg total) by mouth 2 (two) times daily. 180 tablet 3  . pantoprazole (PROTONIX) 40 MG tablet Take 1 tablet (40 mg total) by mouth 2 (two) times daily. 180 tablet 3  . rosuvastatin (CRESTOR) 10 MG tablet Take 0.5 tablets (5 mg total) by mouth daily. 45 tablet 3  . sildenafil (VIAGRA) 50 MG tablet Take 50 mg by mouth as needed.     . traZODone (DESYREL) 150 MG tablet Take 150 mg by mouth at bedtime.     No current facility-administered medications on file prior to visit.     Objective:   Physical Exam Blood pressure 110/62, temperature 98.1 F (36.7 C), resp. rate 64, height 5\' 7"  (1.702 m), weight 243 lb 9.6 oz (110.496 kg). Alert and oriented. Skin warm and dry. Oral mucosa is  moist.   . Sclera anicteric, conjunctivae is pink. Thyroid not enlarged. No cervical lymphadenopathy. Lungs clear. Heart regular rate and rhythm.  Abdomen is soft. Bowel sounds are positive. No hepatomegaly. No abdominal masses felt. No tenderness.  No edema to lower extremities.         Assessment & Plan:  Epigastric pain. PUD needs to be ruled out.  EGD. The risks and benefits such as perforation, bleeding, and infection were reviewed with the patient and is agreeable.

## 2014-11-04 ENCOUNTER — Telehealth: Payer: Self-pay | Admitting: *Deleted

## 2014-11-04 ENCOUNTER — Encounter (INDEPENDENT_AMBULATORY_CARE_PROVIDER_SITE_OTHER): Payer: Self-pay | Admitting: *Deleted

## 2014-11-04 ENCOUNTER — Other Ambulatory Visit (INDEPENDENT_AMBULATORY_CARE_PROVIDER_SITE_OTHER): Payer: Self-pay | Admitting: *Deleted

## 2014-11-04 DIAGNOSIS — K219 Gastro-esophageal reflux disease without esophagitis: Secondary | ICD-10-CM

## 2014-11-04 NOTE — Telephone Encounter (Signed)
Patient informed. 

## 2014-11-04 NOTE — Telephone Encounter (Signed)
-----   Message from Merlene Laughter, LPN sent at 12/03/9739  3:48 PM EDT ----- Reviewed by Domenic Polite. Please see lower extremity Doppler and ABI report. Let the patient know that the study was normal, argues against obstructive arterial disease causing his leg discomfort. Please append this to report.

## 2014-12-02 ENCOUNTER — Ambulatory Visit (HOSPITAL_COMMUNITY)
Admission: RE | Admit: 2014-12-02 | Discharge: 2014-12-02 | Disposition: A | Discharge status: Home / Self Care | Payer: MEDICARE | Source: Ambulatory Visit | Attending: Internal Medicine | Admitting: Internal Medicine

## 2014-12-02 ENCOUNTER — Encounter (HOSPITAL_COMMUNITY): Payer: Self-pay | Admitting: *Deleted

## 2014-12-02 ENCOUNTER — Encounter (HOSPITAL_COMMUNITY)
Admission: RE | Disposition: A | Discharge status: Home / Self Care | Payer: Self-pay | Source: Ambulatory Visit | Attending: Internal Medicine

## 2014-12-02 DIAGNOSIS — Z7982 Long term (current) use of aspirin: Secondary | ICD-10-CM | POA: Insufficient documentation

## 2014-12-02 DIAGNOSIS — E782 Mixed hyperlipidemia: Secondary | ICD-10-CM | POA: Diagnosis not present

## 2014-12-02 DIAGNOSIS — K219 Gastro-esophageal reflux disease without esophagitis: Secondary | ICD-10-CM | POA: Diagnosis not present

## 2014-12-02 DIAGNOSIS — I252 Old myocardial infarction: Secondary | ICD-10-CM | POA: Insufficient documentation

## 2014-12-02 DIAGNOSIS — N529 Male erectile dysfunction, unspecified: Secondary | ICD-10-CM | POA: Insufficient documentation

## 2014-12-02 DIAGNOSIS — Z79899 Other long term (current) drug therapy: Secondary | ICD-10-CM | POA: Insufficient documentation

## 2014-12-02 DIAGNOSIS — Z87891 Personal history of nicotine dependence: Secondary | ICD-10-CM | POA: Diagnosis not present

## 2014-12-02 DIAGNOSIS — Z6836 Body mass index (BMI) 36.0-36.9, adult: Secondary | ICD-10-CM | POA: Insufficient documentation

## 2014-12-02 DIAGNOSIS — Z9049 Acquired absence of other specified parts of digestive tract: Secondary | ICD-10-CM | POA: Diagnosis not present

## 2014-12-02 DIAGNOSIS — E669 Obesity, unspecified: Secondary | ICD-10-CM | POA: Insufficient documentation

## 2014-12-02 DIAGNOSIS — R1011 Right upper quadrant pain: Secondary | ICD-10-CM | POA: Diagnosis present

## 2014-12-02 DIAGNOSIS — I10 Essential (primary) hypertension: Secondary | ICD-10-CM | POA: Diagnosis not present

## 2014-12-02 DIAGNOSIS — K449 Diaphragmatic hernia without obstruction or gangrene: Secondary | ICD-10-CM | POA: Diagnosis not present

## 2014-12-02 DIAGNOSIS — I2510 Atherosclerotic heart disease of native coronary artery without angina pectoris: Secondary | ICD-10-CM | POA: Diagnosis not present

## 2014-12-02 HISTORY — PX: ESOPHAGOGASTRODUODENOSCOPY: SHX5428

## 2014-12-02 LAB — HEPATIC FUNCTION PANEL
ALT: 18 U/L (ref 17–63)
AST: 22 U/L (ref 15–41)
Albumin: 4 g/dL (ref 3.5–5.0)
Alkaline Phosphatase: 69 U/L (ref 38–126)
Bilirubin, Direct: 0.1 mg/dL (ref 0.1–0.5)
Indirect Bilirubin: 0.8 mg/dL (ref 0.3–0.9)
TOTAL PROTEIN: 7.1 g/dL (ref 6.5–8.1)
Total Bilirubin: 0.9 mg/dL (ref 0.3–1.2)

## 2014-12-02 SURGERY — EGD (ESOPHAGOGASTRODUODENOSCOPY)
Anesthesia: Moderate Sedation

## 2014-12-02 MED ORDER — BUTAMBEN-TETRACAINE-BENZOCAINE 2-2-14 % EX AERO
INHALATION_SPRAY | CUTANEOUS | Status: AC
  Filled 2014-12-02: qty 56

## 2014-12-02 MED ORDER — BUTAMBEN-TETRACAINE-BENZOCAINE 2-2-14 % EX AERO
INHALATION_SPRAY | CUTANEOUS | Status: DC | PRN
  Administered 2014-12-02: 2 via TOPICAL

## 2014-12-02 MED ORDER — MEPERIDINE HCL 50 MG/ML IJ SOLN
INTRAMUSCULAR | Status: DC | PRN
  Administered 2014-12-02 (×2): 25 mg via INTRAVENOUS

## 2014-12-02 MED ORDER — SODIUM CHLORIDE 0.9 % IV SOLN
INTRAVENOUS | Status: DC
  Administered 2014-12-02: 1000 mL via INTRAVENOUS

## 2014-12-02 MED ORDER — MIDAZOLAM HCL 5 MG/5ML IJ SOLN
INTRAMUSCULAR | Status: AC
  Filled 2014-12-02: qty 10

## 2014-12-02 MED ORDER — MEPERIDINE HCL 50 MG/ML IJ SOLN
INTRAMUSCULAR | Status: AC
  Filled 2014-12-02: qty 1

## 2014-12-02 MED ORDER — MIDAZOLAM HCL 5 MG/5ML IJ SOLN
INTRAMUSCULAR | Status: DC | PRN
  Administered 2014-12-02 (×2): 2 mg via INTRAVENOUS

## 2014-12-02 NOTE — Op Note (Signed)
EGD PROCEDURE REPORT  PATIENT:  David Combs  MR#:  [id number] Birthdate:  06-21-1946, 69 y.o., male Endoscopist:  Dr. Rogene Houston, MD Referred By:  Dr. Glenda Chroman,, MD Procedure Date: 12/02/2014  Procedure:   EGD  Indications:  Patient is 69 year old Caucasian male whose been experiencing right upper quadrant pain since he had cholecystectomy in November 2015. He also has 5 year history of GERD. Since he has doubled up on pantoprazole he is feeling better with the pain has not gone away completely. He did have upper abdominal ultrasound by Dr. Nicholes Stairs on 08/14/2014 and bile duct measured 5 mm and there was no fluid collection. Pancreas was not well seen. Patient is undergoing diagnostic EGD.            Informed Consent:  The risks, benefits, alternatives & imponderables which include, but are not limited to, bleeding, infection, perforation, drug reaction and potential missed lesion have been reviewed.  The potential for biopsy, lesion removal, esophageal dilation, etc. have also been discussed.  Questions have been answered.  All parties agreeable.  Please see history & physical in medical record for more information.  Medications:  Demerol 50 mg IV Versed 4 mg IV Cetacaine spray topically for oropharyngeal anesthesia  Description of procedure:  The endoscope was introduced through the mouth and advanced to the second portion of the duodenum without difficulty or limitations. The mucosal surfaces were surveyed very carefully during advancement of the scope and upon withdrawal.  Findings:  Esophagus:  Mucosa of the esophagus was normal. GE junction was unremarkable. GEJ:  38 cm Hiatus:  41 cm Stomach:  Stomach was empty and distended very well with insufflation. Folds in the proximal stomach were normal. Examination of mucosa at gastric body, antrum, pyloric channel, angularis fundus and cardia was normal. Duodenum:  Normal bulbar and post bulbar mucosa. Ampulla of Vater was well-seen  and appeared to be normal  Therapeutic/Diagnostic Maneuvers Performed:  None  Complications:  None  Impression: Small sliding hiatal hernia otherwise normal EGD. Ampulla of Vater is normal.  Recommendations:  Continue anti-reflux measures. Continue pantoprazole 40 mg by mouth twice a day(before breakfast and evening meal) Will check LFTs today. If LFTs are normal will consider abdominal CT.  Dyson Sevey U  12/02/2014  2:22 PM  CC: Dr. Glenda Chroman., MD & Dr. Rayne Du ref. provider found

## 2014-12-02 NOTE — H&P (Signed)
David Combs is an 69 y.o. male.   Chief Complaint: Patient is here for EGD. HPI: Patient is 69 year old Caucasian male was been having right upper quadrant pain since cholecystectomy of November 2015. He also has a 5 year history of heartburn. He decided to double up on his pantoprazole is not a significant improvement. He still has some pain in the evening few days to long to take second dose. He denies nausea vomiting melena. He has occasional dysphagia primarily with liquids. He has never undergone EGD before.  Past Medical History  Diagnosis Date  . Non-ST elevation myocardial infarction (NSTEMI)     11/11  . Essential hypertension, benign   . Mixed hyperlipidemia   . Obesity   . GERD (gastroesophageal reflux disease)   . ED (erectile dysfunction)   . Dermatophytosis of nail   . Prostatitis, unspecified   . Lumbago   . GERD (gastroesophageal reflux disease)   . Coronary atherosclerosis of native coronary artery     70-80% small diagonal - medically managed, LVEF 55%    Past Surgical History  Procedure Laterality Date  . Cervical spine surgery    . Right knee surgery    . Appendectomy    . Tonsillectomy  AGE 76  . Left wrist fracture surgery  1994  . Cholecystectomy      05/2014. rt upper quadrant pain     History reviewed. No pertinent family history. Social History:  reports that he quit smoking about 12 years ago. His smoking use included Cigarettes. He has a 40 pack-year smoking history. He has never used smokeless tobacco. He reports that he does not drink alcohol or use illicit drugs.  Allergies:  Allergies  Allergen Reactions  . Iodinated Diagnostic Agents Swelling  . Sulfa Drugs Cross Reactors Itching    Medications Prior to Admission  Medication Sig Dispense Refill  . acetaminophen (TYLENOL) 325 MG tablet Take 650 mg by mouth every 6 (six) hours as needed.      Marland Kitchen amLODipine (NORVASC) 5 MG tablet Take 0.5 tablets (2.5 mg total) by mouth daily. 45 tablet 3   . aspirin EC 81 MG tablet Take 81 mg by mouth daily.      Marland Kitchen buPROPion (ZYBAN) 150 MG 12 hr tablet Take 150 mg by mouth daily.    . fish oil-omega-3 fatty acids 1000 MG capsule Take 2 g by mouth daily.    Marland Kitchen loratadine (CLARITIN) 10 MG tablet Take 10 mg by mouth daily.    . metoprolol tartrate (LOPRESSOR) 25 MG tablet Take 1 tablet (25 mg total) by mouth 2 (two) times daily. 180 tablet 3  . pantoprazole (PROTONIX) 40 MG tablet Take 1 tablet (40 mg total) by mouth 2 (two) times daily. 180 tablet 3  . rosuvastatin (CRESTOR) 10 MG tablet Take 0.5 tablets (5 mg total) by mouth daily. 45 tablet 3  . traZODone (DESYREL) 150 MG tablet Take 150 mg by mouth at bedtime.    . sildenafil (VIAGRA) 50 MG tablet Take 50 mg by mouth as needed.       No results found for this or any previous visit (from the past 48 hour(s)). No results found.  ROS  Blood pressure 142/77, pulse 44, temperature 97.5 F (36.4 C), temperature source Oral, resp. rate 14, height 5\' 8"  (1.727 m), weight 240 lb (108.863 kg), SpO2 99 %. Physical Exam  Constitutional: He appears well-developed and well-nourished.  HENT:  Mouth/Throat: Oropharynx is clear and moist.  Eyes: Conjunctivae are normal.  No scleral icterus.  Neck: No thyromegaly present.  Cardiovascular: Normal rate, regular rhythm and normal heart sounds.   No murmur heard. Respiratory: Effort normal and breath sounds normal.  GI:  Small umbilical/supraumbilical hernia. Abdomen is soft and nontender without organomegaly or masses.  Musculoskeletal: He exhibits no edema.  Lymphadenopathy:    He has no cervical adenopathy.  Neurological: He is alert.  Skin: Skin is warm and dry.     Assessment/Plan Right upper quadrant pain since cholecystectomy in November 2015. Chronic GERD. Diagnostic EGD.  REHMAN,NAJEEB U 12/02/2014, 2:00 PM

## 2014-12-02 NOTE — Discharge Instructions (Signed)
Resume usual medications. Remember to take pantoprazole 30 minutes before breakfast and evening meal daily. Resume usual diet. No driving for 24 hours. Physician will call with results of blood work.     Esophagogastroduodenoscopy Care After Refer to this sheet in the next few weeks. These instructions provide you with information on caring for yourself after your procedure. Your caregiver may also give you more specific instructions. Your treatment has been planned according to current medical practices, but problems sometimes occur. Call your caregiver if you have any problems or questions after your procedure.  HOME CARE INSTRUCTIONS  Do not eat or drink anything until the numbing medicine (local anesthetic) has worn off and your gag reflex has returned. You will know that the local anesthetic has worn off when you can swallow comfortably.  Do not drive for 12 hours after the procedure or as directed by your caregiver.  Only take medicines as directed by your caregiver. SEEK MEDICAL CARE IF:   You cannot stop coughing.  You are not urinating at all or less than usual. SEEK IMMEDIATE MEDICAL CARE IF:  You have difficulty swallowing.  You cannot eat or drink.  You have worsening throat or chest pain.  You have dizziness, lightheadedness, or you faint.  You have nausea or vomiting.  You have chills.  You have a fever.  You have severe abdominal pain.  You have black, tarry, or bloody stools.  Hiatal Hernia A hiatal hernia occurs when part of your stomach slides above the muscle that separates your abdomen from your chest (diaphragm). You can be born with a hiatal hernia (congenital), or it may develop over time. In almost all cases of hiatal hernia, only the top part of the stomach pushes through.  Many people have a hiatal hernia with no symptoms. The larger the hernia, the more likely that you will have symptoms. In some cases, a hiatal hernia allows stomach acid to  flow back into the tube that carries food from your mouth to your stomach (esophagus). This may cause heartburn symptoms. Severe heartburn symptoms may mean you have developed a condition called gastroesophageal reflux disease (GERD).  CAUSES  Hiatal hernias are caused by a weakness in the opening (hiatus) where your esophagus passes through your diaphragm to attach to the upper part of your stomach. You may be born with a weakness in your hiatus, or a weakness can develop. RISK FACTORS Older age is a major risk factor for a hiatal hernia. Anything that increases pressure on your diaphragm can also increase your risk of a hiatal hernia. This includes:  Pregnancy.  Excess weight.  Frequent constipation. SIGNS AND SYMPTOMS  People with a hiatal hernia often have no symptoms. If symptoms develop, they are almost always caused by GERD. They may include:  Heartburn.  Belching.  Indigestion.  Trouble swallowing.  Coughing or wheezing.  Sore throat.  Hoarseness.  Chest pain. DIAGNOSIS  A hiatal hernia is sometimes found during an exam for another problem. Your health care provider may suspect a hiatal hernia if you have symptoms of GERD. Tests may be done to diagnose GERD. These may include:  X-rays of your stomach or chest.  An upper gastrointestinal (GI) series. This is an X-ray exam of your GI tract involving the use of a chalky liquid that you swallow. The liquid shows up clearly on the X-ray.  Endoscopy. This is a procedure to look into your stomach using a thin, flexible tube that has a tiny camera and light  on the end of it. TREATMENT  If you have no symptoms, you may not need treatment. If you have symptoms, treatment may include:  Dietary and lifestyle changes to help reduce GERD symptoms.  Medicines. These may include:  Over-the-counter antacids.  Medicines that make your stomach empty more quickly.  Medicines that block the production of stomach acid (H2  blockers).  Stronger medicines to reduce stomach acid (proton pump inhibitors).  You may need surgery to repair the hernia if other treatments are not helping. HOME CARE INSTRUCTIONS   Take all medicines as directed by your health care provider.  Quit smoking, if you smoke.  Try to achieve and maintain a healthy body weight.  Eat frequent small meals instead of three large meals a day. This keeps your stomach from getting too full.  Eat slowly.  Do not lie down right after eating.  Do noteat 1-2 hours before bed.   Do not drink beverages with caffeine. These include cola, coffee, cocoa, and tea.  Do not drink alcohol.  Avoid foods that can make symptoms of GERD worse. These may include:  Fatty foods.  Citrus fruits.  Other foods and drinks that contain acid.  Avoid putting pressure on your belly. Anything that puts pressure on your belly increases the amount of acid that may be pushed up into your esophagus.   Avoid bending over, especially after eating.  Raise the head of your bed by putting blocks under the legs. This keeps your head and esophagus higher than your stomach.  Do not wear tight clothing around your chest or stomach.  Try not to strain when having a bowel movement, when urinating, or when lifting heavy objects. SEEK MEDICAL CARE IF:  Your symptoms are not controlled with medicines or lifestyle changes.  You are having trouble swallowing.  You have coughing or wheezing that will not go away. SEEK IMMEDIATE MEDICAL CARE IF:  Your pain is getting worse.  Your pain spreads to your arms, neck, jaw, teeth, or back.  You have shortness of breath.  You sweat for no reason.  You feel sick to your stomach (nauseous) or vomit.  You vomit blood.  You have bright red blood in your stools.  You have black, tarry stools.  Document Released: 10/07/2003 Document Revised: 12/01/2013 Document Reviewed: 07/04/2013 [facility] Patient Information 2015  [place], [place]. This information is not intended to replace advice given to you by your health care provider. Make sure you discuss any questions you have with your health care provider.

## 2014-12-04 ENCOUNTER — Encounter (HOSPITAL_COMMUNITY): Payer: Self-pay | Admitting: Internal Medicine

## 2014-12-17 ENCOUNTER — Telehealth: Payer: Self-pay | Admitting: *Deleted

## 2014-12-17 NOTE — Telephone Encounter (Signed)
Pt insurance wants to change from pantoprazole to omeprazole, pt wants to know if this change is ok since he has been on pantoprazole for so long. Will forward to Dr. Domenic Polite.

## 2014-12-17 NOTE — Telephone Encounter (Signed)
I do not see a major concern with this change at this point.

## 2014-12-17 NOTE — Telephone Encounter (Signed)
Pt made aware. Pt still has refills of pantoprazole and will update medication list after he starts omeprazole.

## 2015-03-03 ENCOUNTER — Encounter (INDEPENDENT_AMBULATORY_CARE_PROVIDER_SITE_OTHER): Payer: Self-pay | Admitting: *Deleted

## 2015-03-23 ENCOUNTER — Ambulatory Visit (INDEPENDENT_AMBULATORY_CARE_PROVIDER_SITE_OTHER): Payer: MEDICARE | Admitting: Internal Medicine

## 2015-03-23 ENCOUNTER — Encounter (INDEPENDENT_AMBULATORY_CARE_PROVIDER_SITE_OTHER): Payer: Self-pay

## 2015-04-21 ENCOUNTER — Ambulatory Visit: Payer: MEDICARE | Admitting: Cardiology

## 2015-04-22 ENCOUNTER — Ambulatory Visit: Payer: MEDICARE | Admitting: Cardiology

## 2015-04-29 ENCOUNTER — Encounter: Payer: Self-pay | Admitting: Cardiology

## 2015-04-29 ENCOUNTER — Ambulatory Visit (INDEPENDENT_AMBULATORY_CARE_PROVIDER_SITE_OTHER): Payer: MEDICARE | Admitting: Cardiology

## 2015-04-29 VITALS — BP 132/80 | HR 44 | Ht 67.0 in | Wt 241.8 lb

## 2015-04-29 DIAGNOSIS — E782 Mixed hyperlipidemia: Secondary | ICD-10-CM | POA: Diagnosis not present

## 2015-04-29 DIAGNOSIS — I2510 Atherosclerotic heart disease of native coronary artery without angina pectoris: Secondary | ICD-10-CM

## 2015-04-29 DIAGNOSIS — I10 Essential (primary) hypertension: Secondary | ICD-10-CM

## 2015-04-29 MED ORDER — METOPROLOL TARTRATE 25 MG PO TABS: 25.0000 mg | ORAL_TABLET | Freq: Two times a day (BID) | ORAL | Status: DC

## 2015-04-29 NOTE — Patient Instructions (Signed)
Your physician has recommended you make the following change in your medication:   You are being weaned off of metoprolol tartrate or lopressor. Today 04/29/15 take 25 mg twice daily, then tomorrow 04/30/15 decrease to 1/2 tablet twice daily, then STOP lopressor. Continue all other medications the same. Your physician recommends that you schedule a follow-up appointment in: 6 months. You will receive a reminder letter in the mail in about 4 months reminding you to call and schedule your appointment. If you don't receive this letter, please contact our office.

## 2015-04-29 NOTE — Progress Notes (Signed)
Cardiology Office Note  Date: 04/29/2015   ID: MASE DHONDT, DOB 1945-08-20, MRN [id number]  PCP: Glenda Chroman., MD  Primary Cardiologist: Rozann Lesches, MD   Chief Complaint  Patient presents with  . Coronary Artery Disease    History of Present Illness: David Combs is a 69 y.o. male last seen in March. He presents for a routine follow-up visit. States that he remains fatigued, but still tries to get out and play golf twice a week. He does not report any dizziness or syncope. No angina symptoms on medical therapy.  He states that he continues to have interval lab work through the [facility], had lipids obtained this year with reportedly better control.  We discussed his medications, and have decided to stop his Lopressor to see if by allowing heart rate come up some more, he might have less fatigue. He will wean off the medication this week.   Past Medical History  Diagnosis Date  . Non-ST elevation myocardial infarction (NSTEMI)     11/11  . Essential hypertension, benign   . Mixed hyperlipidemia   . Obesity   . GERD (gastroesophageal reflux disease)   . ED (erectile dysfunction)   . Dermatophytosis of nail   . Prostatitis, unspecified   . Lumbago   . GERD (gastroesophageal reflux disease)   . Coronary atherosclerosis of native coronary artery     70-80% small diagonal - medically managed, LVEF 55%    Current Outpatient Prescriptions  Medication Sig Dispense Refill  . acetaminophen (TYLENOL) 325 MG tablet Take 650 mg by mouth every 6 (six) hours as needed.      Marland Kitchen amLODipine (NORVASC) 5 MG tablet Take 0.5 tablets (2.5 mg total) by mouth daily. 45 tablet 3  . aspirin EC 81 MG tablet Take 81 mg by mouth daily.      Marland Kitchen buPROPion (ZYBAN) 150 MG 12 hr tablet Take 150 mg by mouth daily.    . fish oil-omega-3 fatty acids 1000 MG capsule Take 2 g by mouth daily.    Marland Kitchen loratadine (CLARITIN) 10 MG tablet Take 10 mg by mouth daily.    . metoprolol tartrate  (LOPRESSOR) 25 MG tablet Take 1 tablet (25 mg total) by mouth 2 (two) times daily. Today 04/29/15, then tomorrow 04/30/15 decrease to 1/2 tablet twice daily, then STOP lopressor 3 tablet 0  . omeprazole (PRILOSEC) 40 MG capsule Take 40 mg by mouth 2 (two) times daily.    . rosuvastatin (CRESTOR) 10 MG tablet Take 0.5 tablets (5 mg total) by mouth daily. 45 tablet 3  . sildenafil (VIAGRA) 50 MG tablet Take 50 mg by mouth as needed.     . traZODone (DESYREL) 150 MG tablet Take 150 mg by mouth at bedtime.     No current facility-administered medications for this visit.    Allergies:  Iodinated diagnostic agents and Sulfa drugs cross reactors   Social History: The patient  reports that he quit smoking about 12 years ago. His smoking use included Cigarettes. He has a 40 pack-year smoking history. He has never used smokeless tobacco. He reports that he does not drink alcohol or use illicit drugs.   ROS:  Please see the history of present illness. Otherwise, complete review of systems is positive for intermittent reflux.  All other systems are reviewed and negative.   Physical Exam: VS:  BP 132/80 mmHg  Pulse 44  Ht 5\' 7"  (1.702 m)  Wt 241 lb 12.8 oz (109.68 kg)  BMI 37.86 kg/m2  SpO2 97%, BMI Body mass index is 37.86 kg/(m^2).  Wt Readings from Last 3 Encounters:  04/29/15 241 lb 12.8 oz (109.68 kg)  12/02/14 240 lb (108.863 kg)  11/03/14 243 lb 9.6 oz (110.496 kg)     General: Patient appears comfortable at rest. HEENT: Conjunctiva and lids normal, oropharynx clear with moist mucosa. Neck: Supple, no elevated JVP or carotid bruits, no thyromegaly. Lungs: Clear to auscultation, nonlabored breathing at rest. Cardiac: Regular rate and rhythm, no S3 or significant systolic murmur, no pericardial rub. Abdomen: Soft, nontender, no hepatomegaly, bowel sounds present, no guarding or rebound. Extremities: No pitting edema, distal pulses 2+. Skin: Warm and dry. Musculoskeletal: No  kyphosis. Neuropsychiatric: Alert and oriented x3, affect grossly appropriate.   ECG: ECG is ordered today and shows sinus bradycardia at 46 bpm.   Recent Labwork: 12/02/2014: ALT 18; AST 22   Assessment and Plan:  1. Symptomatically stable branch vessel CAD, plan to continue medical therapy and observation.  2. Sinus bradycardia, plan to stop Lopressor to see if he experiences less fatigue. He will wean off of the medication this week.  3. Interval lower extremity arterial studies did not demonstrate any evidence of obstructive disease.  4. Essential hypertension, blood pressure is reasonable today.  5. Hyperlipidemia, on low-dose Crestor. Keep follow-up to the [place] system for lab work.  Current medicines were reviewed with the patient today.   Orders Placed This Encounter  Procedures  . EKG 12-Lead    Disposition: FU with me in 6 months.   Signed, Satira Sark, MD, [facility] 04/29/2015 12:42 PM    [facility] at [place] [address], [place], [place] [address] Phone: [phone]; Fax: [phone]

## 2015-08-03 ENCOUNTER — Telehealth: Payer: Self-pay | Admitting: *Deleted

## 2015-08-03 NOTE — Telephone Encounter (Signed)
Pt says woke up with bad dream and chest pain that subsided after a few minutes. Also c/o of L arm tingling and neck aches for the last week. Denies CP and dizziness at this time has occasional SOB but says this is normal for him. Told pt if he is having active CP with hx to report to ED. Pt asking for appt with Dr. Domenic Polite added him for 08/06/15, agreeable to go to ED if CP returns. Will forward to Dr. Domenic Polite

## 2015-08-06 ENCOUNTER — Encounter: Payer: Self-pay | Admitting: *Deleted

## 2015-08-06 ENCOUNTER — Encounter: Payer: Self-pay | Admitting: Cardiology

## 2015-08-06 ENCOUNTER — Ambulatory Visit (INDEPENDENT_AMBULATORY_CARE_PROVIDER_SITE_OTHER): Payer: MEDICARE | Admitting: Cardiology

## 2015-08-06 VITALS — BP 145/86 | HR 59 | Ht 67.0 in | Wt 246.6 lb

## 2015-08-06 DIAGNOSIS — I10 Essential (primary) hypertension: Secondary | ICD-10-CM | POA: Diagnosis not present

## 2015-08-06 DIAGNOSIS — I25709 Atherosclerosis of coronary artery bypass graft(s), unspecified, with unspecified angina pectoris: Secondary | ICD-10-CM

## 2015-08-06 DIAGNOSIS — R079 Chest pain, unspecified: Secondary | ICD-10-CM | POA: Diagnosis not present

## 2015-08-06 DIAGNOSIS — E782 Mixed hyperlipidemia: Secondary | ICD-10-CM | POA: Diagnosis not present

## 2015-08-06 DIAGNOSIS — N529 Male erectile dysfunction, unspecified: Secondary | ICD-10-CM

## 2015-08-06 MED ORDER — AMLODIPINE BESYLATE 5 MG PO TABS: 5.0000 mg | ORAL_TABLET | Freq: Every day | ORAL | Status: DC

## 2015-08-06 MED ORDER — NITROGLYCERIN 0.4 MG SL SUBL: 0.4000 mg | SUBLINGUAL_TABLET | SUBLINGUAL | Status: AC | PRN

## 2015-08-06 NOTE — Progress Notes (Signed)
Cardiology Office Note  Date: 08/06/2015   ID: David Combs, DOB 08/19/1945, MRN [id number]  PCP: Glenda Chroman., MD  Primary Cardiologist: Rozann Lesches, MD   Chief Complaint  Patient presents with  . Coronary Artery Disease    History of Present Illness: David Combs is a 70 y.o. male last seen in September 2016. He presents for a follow-up visit. He tells me that he experienced a significant episode of angina after having a nightmare 1 evening. Neck small morning he had tingling in his left arm that was prolonged. Otherwise he continues to have intermittent angina symptoms as before on medical therapy.  We have taken him off Lopressor secondary to bradycardia and fatigue. Additional medications are reviewed below. She continues on low-dose Norvasc, aspirin, omega-3 supplements, and Crestor. He uses Viagra once a week. We have not put him on a long-acting nitrate.  Last stress testing was in 2012 as detailed below. Follow-up ECG today showed normal sinus rhythm.  Otherwise he continues with his typical activities, tries to play golf once or twice a week. He has NYHA class II dyspnea at baseline.  Past Medical History  Diagnosis Date  . Non-ST elevation myocardial infarction (NSTEMI) ([place])     11/11  . Essential hypertension, benign   . Mixed hyperlipidemia   . Obesity   . GERD (gastroesophageal reflux disease)   . ED (erectile dysfunction)   . Dermatophytosis of nail   . Prostatitis, unspecified   . Lumbago   . GERD (gastroesophageal reflux disease)   . Coronary atherosclerosis of native coronary artery     70-80% small diagonal - medically managed, LVEF 55%    Past Surgical History  Procedure Laterality Date  . Cervical spine surgery    . Right knee surgery    . Appendectomy    . Tonsillectomy  AGE 18  . Left wrist fracture surgery  1994  . Cholecystectomy      05/2014. rt upper quadrant pain   . Esophagogastroduodenoscopy N/A 12/02/2014    Procedure:  ESOPHAGOGASTRODUODENOSCOPY (EGD);  Surgeon: Rogene Houston, MD;  Location: [facility];  Service: Endoscopy;  Laterality: N/A;  200    Current Outpatient Prescriptions  Medication Sig Dispense Refill  . acetaminophen (TYLENOL) 325 MG tablet Take 650 mg by mouth every 6 (six) hours as needed.      Marland Kitchen amLODipine (NORVASC) 5 MG tablet Take 0.5 tablets (2.5 mg total) by mouth daily. 45 tablet 3  . AMOXICILLIN PO Take 1 tablet by mouth daily.    Marland Kitchen aspirin EC 81 MG tablet Take 81 mg by mouth daily.      Marland Kitchen buPROPion (ZYBAN) 150 MG 12 hr tablet Take 150 mg by mouth daily.    . fish oil-omega-3 fatty acids 1000 MG capsule Take 2 g by mouth daily.    Marland Kitchen loratadine (CLARITIN) 10 MG tablet Take 10 mg by mouth daily.    Marland Kitchen omeprazole (PRILOSEC) 40 MG capsule Take 40 mg by mouth 2 (two) times daily.    . rosuvastatin (CRESTOR) 10 MG tablet Take 0.5 tablets (5 mg total) by mouth daily. 45 tablet 3  . sildenafil (VIAGRA) 50 MG tablet Take 50 mg by mouth as needed.     . traZODone (DESYREL) 150 MG tablet Take 150 mg by mouth at bedtime.     No current facility-administered medications for this visit.   Allergies:  Iodinated diagnostic agents and Sulfa drugs cross reactors   Social History: The patient  reports that he quit smoking about 13 years ago. His smoking use included Cigarettes. He has a 40 pack-year smoking history. He has never used smokeless tobacco. He reports that he does not drink alcohol or use illicit drugs.   ROS:  Please see the history of present illness. Otherwise, complete review of systems is positive for erectile dysfunction.  All other systems are reviewed and negative.   Physical Exam: VS:  BP 145/86 mmHg  Pulse 59  Ht 5\' 7"  (1.702 m)  Wt 246 lb 9.6 oz (111.857 kg)  BMI 38.61 kg/m2  SpO2 96%, BMI Body mass index is 38.61 kg/(m^2).  Wt Readings from Last 3 Encounters:  08/06/15 246 lb 9.6 oz (111.857 kg)  04/29/15 241 lb 12.8 oz (109.68 kg)  12/02/14 240 lb (108.863 kg)      General: Overweight male, appears comfortable at rest. HEENT: Conjunctiva and lids normal, oropharynx clear. Neck: Supple, no elevated JVP or carotid bruits, no thyromegaly. Lungs: Clear to auscultation, nonlabored breathing at rest. Cardiac: Regular rate and rhythm, no S3 or significant systolic murmur, no pericardial rub. Abdomen: Protuberant, nontender, bowel sounds present, no guarding or rebound. Extremities: No pitting edema, distal pulses 2+. Skin: Warm and dry. Musculoskeletal: No kyphosis. Neuropsychiatric: Alert and oriented x3, affect grossly appropriate.  ECG: ECG is ordered today.  Recent Labwork: 12/02/2014: ALT 18; AST 22   Other Studies Reviewed Today:  Exercise Myoview 11/10/2010: QPS Raw Data Images: Normal; no motion artifact; normal heart/lung ratio. Stress Images: There is an apical defect with normal uptake in the other regions of the LV.  Rest Images: There is apical defect with normal uptake in the other regions of the LV.  Subtraction (SDS): There is a fixed apical defect consistent with a previous apical MI. Transient Ischemic Dilatation (Normal <1.22): 0.90 Lung/Heart Ratio (Normal <0.45): 0.35  Quantitative Gated Spect Images QGS EDV: 109 ml QGS ESV: 41 ml QGS cine images: The overall LV function is normal. There is mild hypokinesis of the apex as demonstrated by the surface LV images.  QGS EF: 62%  Impression Exercise Capacity: Good exercise capacity. BP Response: Normal blood pressure response. Clinical Symptoms: No chest pain. ECG Impression: No significant ST segment change suggestive of ischemia. Comparison with Prior Nuclear Study: No previous nuclear study performed  Overall Impression: Low risk stress nuclear study. There is evidence of a previous apical MI. There is no evidence of reversible ischemia. The overall LV function is normal. There is mild hypokinesis of the apex as viewed in the surface LV  images.  Assessment and Plan:  1. Recurring angina symptoms as outlined above with history of branch vessel CAD involving the diagonal system. Plan is to increase Norvasc to 5 mg daily, provide sublingual nitroglycerin for use (avoid within 24-48 hours of Viagra).  We will also obtain a Lexiscan Cardiolite to follow-up with ischemic burden.  2. Essential hypertension. Blood pressure is mildly elevated today. Norvasc is being increased as outlined.  3. Hyperlipidemia, continues on Crestor. Lipids are tracked through the [facility] medical system.  4. Erectile dysfunction, he uses Viagra once a week. We do not have him on long-acting nitrates.  Current medicines were reviewed with the patient today.   Orders Placed This Encounter  Procedures  . EKG 12-Lead    Disposition: FU with me in 6 months.   Signed, Satira Sark, MD, [facility] 08/06/2015 8:50 AM    [facility] at [place], [place], [place] [address] Phone: [phone]; Fax: [phone]

## 2015-08-06 NOTE — Patient Instructions (Signed)
Your physician has recommended you make the following change in your medication:  Increase amlodipine to 5 mg daily. Start nitroglycerin 0.4 mg sublingual as needed for severe chest pain every 5 minutes up to 3 doses in a 15 minute increment. If no relief after 3 rd dose, proceed to the ED for an evalaution. Continue all other medications the same. Your physician has requested that you have a lexiscan myoview. For further information please visit [email or web address]. Please follow instruction sheet, as given. Your physician recommends that you schedule a follow-up appointment in: 6 months. You will receive a reminder letter in the mail in about 4 months reminding you to call and schedule your appointment. If you don't receive this letter, please contact our office.

## 2015-08-09 DIAGNOSIS — I10 Essential (primary) hypertension: Secondary | ICD-10-CM | POA: Diagnosis not present

## 2015-08-09 DIAGNOSIS — M25571 Pain in right ankle and joints of right foot: Secondary | ICD-10-CM | POA: Diagnosis not present

## 2015-08-09 DIAGNOSIS — Z6838 Body mass index (BMI) 38.0-38.9, adult: Secondary | ICD-10-CM | POA: Diagnosis not present

## 2015-08-09 DIAGNOSIS — M25461 Effusion, right knee: Secondary | ICD-10-CM | POA: Diagnosis not present

## 2015-08-09 DIAGNOSIS — M1711 Unilateral primary osteoarthritis, right knee: Secondary | ICD-10-CM | POA: Diagnosis not present

## 2015-08-09 DIAGNOSIS — M25561 Pain in right knee: Secondary | ICD-10-CM | POA: Diagnosis not present

## 2015-08-16 ENCOUNTER — Encounter (HOSPITAL_COMMUNITY)
Admission: RE | Admit: 2015-08-16 | Discharge: 2015-08-16 | Disposition: A | Discharge status: Home / Self Care | Payer: MEDICARE | Source: Ambulatory Visit | Attending: Cardiology | Admitting: Cardiology

## 2015-08-16 ENCOUNTER — Encounter (HOSPITAL_COMMUNITY): Payer: Self-pay

## 2015-08-16 ENCOUNTER — Encounter (HOSPITAL_COMMUNITY): Payer: MEDICARE

## 2015-08-16 DIAGNOSIS — I25119 Atherosclerotic heart disease of native coronary artery with unspecified angina pectoris: Secondary | ICD-10-CM | POA: Insufficient documentation

## 2015-08-16 DIAGNOSIS — I25709 Atherosclerosis of coronary artery bypass graft(s), unspecified, with unspecified angina pectoris: Secondary | ICD-10-CM | POA: Diagnosis not present

## 2015-08-16 LAB — NM MYOCAR MULTI W/SPECT W/WALL MOTION / EF
CHL CUP NUCLEAR SDS: 2
CHL CUP RESTING HR STRESS: 58 {beats}/min
LHR: 0.34
LV dias vol: 112 mL
LV sys vol: 45 mL
Peak HR: 76 {beats}/min
SRS: 0
SSS: 2
TID: 1.22

## 2015-08-16 MED ORDER — TECHNETIUM TC 99M SESTAMIBI GENERIC - CARDIOLITE
10.0000 | Freq: Once | INTRAVENOUS | Status: AC | PRN
  Administered 2015-08-16: 11 via INTRAVENOUS

## 2015-08-16 MED ORDER — TECHNETIUM TC 99M SESTAMIBI - CARDIOLITE
30.0000 | Freq: Once | INTRAVENOUS | Status: AC | PRN
  Administered 2015-08-16: 33 via INTRAVENOUS

## 2015-08-16 MED ORDER — REGADENOSON 0.4 MG/5ML IV SOLN
INTRAVENOUS | Status: AC
  Administered 2015-08-16: 0.4 mg
  Filled 2015-08-16: qty 5

## 2015-08-16 MED ORDER — SODIUM CHLORIDE 0.9 % IJ SOLN
INTRAMUSCULAR | Status: AC
  Filled 2015-08-16: qty 3

## 2015-08-17 ENCOUNTER — Telehealth: Payer: Self-pay | Admitting: *Deleted

## 2015-08-17 NOTE — Telephone Encounter (Signed)
Patient informed. 

## 2015-08-17 NOTE — Telephone Encounter (Signed)
-----   Message from Satira Sark, MD sent at 08/17/2015  7:58 AM EST ----- Reviewed report. Please let him know that the stress test was low risk and would suggest that we can continue medical therapy for now as long as symptom control is adequate.

## 2015-08-18 DIAGNOSIS — Z9181 History of falling: Secondary | ICD-10-CM | POA: Diagnosis not present

## 2015-08-18 DIAGNOSIS — S93411A Sprain of calcaneofibular ligament of right ankle, initial encounter: Secondary | ICD-10-CM | POA: Diagnosis not present

## 2015-09-29 DIAGNOSIS — S93411A Sprain of calcaneofibular ligament of right ankle, initial encounter: Secondary | ICD-10-CM | POA: Diagnosis not present

## 2015-10-12 ENCOUNTER — Telehealth: Payer: Self-pay | Admitting: Cardiology

## 2015-10-12 NOTE — Telephone Encounter (Signed)
David Combs called stating that he has had a red itching spot on his left ankle. He did research last evening and it stated that long periods of heart medications can Cause this to happen.

## 2015-10-12 NOTE — Telephone Encounter (Signed)
Patient c/o having a ringworm about 2 inches above his left ankle that is half the size of a dollar piece. Patient said that it has been there for about 2 months. Patient said that he read that heart medications can cause ringworm after taking them for long periods. Patient advised that his amlodipine would not cause ringworm but could cause edema. Patient also wanted to know what he could take for arthritis pain. Nurse advised patient that he could [place] plain tyenol but its to be used sparingly. Patient verbalized understanding.

## 2015-10-26 ENCOUNTER — Ambulatory Visit: Payer: MEDICARE | Admitting: Cardiology

## 2015-11-02 DIAGNOSIS — Z1389 Encounter for screening for other disorder: Secondary | ICD-10-CM | POA: Diagnosis not present

## 2015-11-02 DIAGNOSIS — Z7189 Other specified counseling: Secondary | ICD-10-CM | POA: Diagnosis not present

## 2015-11-02 DIAGNOSIS — Z125 Encounter for screening for malignant neoplasm of prostate: Secondary | ICD-10-CM | POA: Diagnosis not present

## 2015-11-02 DIAGNOSIS — M159 Polyosteoarthritis, unspecified: Secondary | ICD-10-CM | POA: Diagnosis not present

## 2015-11-02 DIAGNOSIS — E7800 Pure hypercholesterolemia, unspecified: Secondary | ICD-10-CM | POA: Diagnosis not present

## 2015-11-02 DIAGNOSIS — R0781 Pleurodynia: Secondary | ICD-10-CM | POA: Diagnosis not present

## 2015-11-02 DIAGNOSIS — Z87311 Personal history of (healed) other pathological fracture: Secondary | ICD-10-CM | POA: Diagnosis not present

## 2015-11-02 DIAGNOSIS — Z0000 Encounter for general adult medical examination without abnormal findings: Secondary | ICD-10-CM | POA: Diagnosis not present

## 2015-11-02 DIAGNOSIS — Z79899 Other long term (current) drug therapy: Secondary | ICD-10-CM | POA: Diagnosis not present

## 2015-11-02 DIAGNOSIS — Z6838 Body mass index (BMI) 38.0-38.9, adult: Secondary | ICD-10-CM | POA: Diagnosis not present

## 2015-11-02 DIAGNOSIS — Z1211 Encounter for screening for malignant neoplasm of colon: Secondary | ICD-10-CM | POA: Diagnosis not present

## 2015-11-02 DIAGNOSIS — N400 Enlarged prostate without lower urinary tract symptoms: Secondary | ICD-10-CM | POA: Diagnosis not present

## 2015-11-02 DIAGNOSIS — R5383 Other fatigue: Secondary | ICD-10-CM | POA: Diagnosis not present

## 2015-11-02 DIAGNOSIS — R0789 Other chest pain: Secondary | ICD-10-CM | POA: Diagnosis not present

## 2015-11-02 DIAGNOSIS — Z299 Encounter for prophylactic measures, unspecified: Secondary | ICD-10-CM | POA: Diagnosis not present

## 2015-11-09 DIAGNOSIS — M79672 Pain in left foot: Secondary | ICD-10-CM | POA: Diagnosis not present

## 2015-11-09 DIAGNOSIS — M25579 Pain in unspecified ankle and joints of unspecified foot: Secondary | ICD-10-CM | POA: Diagnosis not present

## 2015-11-30 DIAGNOSIS — M545 Low back pain: Secondary | ICD-10-CM | POA: Diagnosis not present

## 2015-11-30 DIAGNOSIS — Z6837 Body mass index (BMI) 37.0-37.9, adult: Secondary | ICD-10-CM | POA: Diagnosis not present

## 2015-11-30 DIAGNOSIS — M5441 Lumbago with sciatica, right side: Secondary | ICD-10-CM | POA: Diagnosis not present

## 2015-12-23 DIAGNOSIS — Z6841 Body Mass Index (BMI) 40.0 and over, adult: Secondary | ICD-10-CM | POA: Diagnosis not present

## 2015-12-23 DIAGNOSIS — M5441 Lumbago with sciatica, right side: Secondary | ICD-10-CM | POA: Diagnosis not present

## 2015-12-30 DIAGNOSIS — M5441 Lumbago with sciatica, right side: Secondary | ICD-10-CM | POA: Diagnosis not present

## 2015-12-30 DIAGNOSIS — M5126 Other intervertebral disc displacement, lumbar region: Secondary | ICD-10-CM | POA: Diagnosis not present

## 2016-01-10 ENCOUNTER — Telehealth: Payer: Self-pay | Admitting: Cardiology

## 2016-01-10 DIAGNOSIS — M5416 Radiculopathy, lumbar region: Secondary | ICD-10-CM | POA: Diagnosis not present

## 2016-01-10 DIAGNOSIS — M5126 Other intervertebral disc displacement, lumbar region: Secondary | ICD-10-CM | POA: Diagnosis not present

## 2016-01-10 DIAGNOSIS — M5441 Lumbago with sciatica, right side: Secondary | ICD-10-CM | POA: Diagnosis not present

## 2016-01-10 DIAGNOSIS — Z6838 Body mass index (BMI) 38.0-38.9, adult: Secondary | ICD-10-CM | POA: Diagnosis not present

## 2016-01-10 NOTE — Telephone Encounter (Signed)
Received phone call from Dr. Dierdre Harness regarding [name]. Patient in need of microdiscectomy due to ruptured disc. I last saw [name] back in January at which time he had relatively stable angina symptoms in the setting of known diagonal disease that is being managed medically. We increased his Norvasc and he continues on aspirin, as needed nitroglycerin, and Crestor. He underwent Cardiolite study in January of this year that was low risk without significant ischemia and normal LVEF. Unless Mr. Blanke has had worsening angina symptoms on stable cardiac regimen, he should not require any further cardiac testing prior to surgery and should be able to proceed an acceptable perioperative cardiac risk.

## 2016-01-27 DIAGNOSIS — M5136 Other intervertebral disc degeneration, lumbar region: Secondary | ICD-10-CM | POA: Diagnosis not present

## 2016-01-27 DIAGNOSIS — M5116 Intervertebral disc disorders with radiculopathy, lumbar region: Secondary | ICD-10-CM | POA: Diagnosis not present

## 2016-01-27 DIAGNOSIS — M5126 Other intervertebral disc displacement, lumbar region: Secondary | ICD-10-CM | POA: Diagnosis not present

## 2016-01-27 DIAGNOSIS — M4726 Other spondylosis with radiculopathy, lumbar region: Secondary | ICD-10-CM | POA: Diagnosis not present

## 2016-02-18 DIAGNOSIS — Z789 Other specified health status: Secondary | ICD-10-CM | POA: Diagnosis not present

## 2016-02-18 DIAGNOSIS — Z299 Encounter for prophylactic measures, unspecified: Secondary | ICD-10-CM | POA: Diagnosis not present

## 2016-02-18 DIAGNOSIS — G4730 Sleep apnea, unspecified: Secondary | ICD-10-CM | POA: Diagnosis not present

## 2016-03-16 NOTE — Progress Notes (Signed)
Cardiology Office Note  Date: 03/17/2016   ID: David Combs, DOB 05-Feb-1946, MRN [id number]  PCP: Glenda Chroman, MD  Primary Cardiologist: Rozann Lesches, MD   Chief Complaint  Patient presents with  . Coronary Artery Disease    History of Present Illness: David Combs is a 70 y.o. male last seen in January. He presents for a routine follow-up visit. Reports no significant angina symptoms at this time on present regimen. I was contacted back in June by Dr. Vertell Limber regarding patient needing a microdiscectomy due to ruptured disc. He underwent the procedure and states that his back feels much better. Has also had less leg pain.  Follow-up Cardiolite from January as outlined below, low risk. He continues on stable medical therapy as outlined below.  Past Medical History:  Diagnosis Date  . Coronary atherosclerosis of native coronary artery    70-80% small diagonal - medically managed, LVEF 55%  . Dermatophytosis of nail   . ED (erectile dysfunction)   . Essential hypertension, benign   . GERD (gastroesophageal reflux disease)   . GERD (gastroesophageal reflux disease)   . Lumbago   . Mixed hyperlipidemia   . Non-ST elevation myocardial infarction (NSTEMI) ([place])    11/11  . Obesity   . Prostatitis, unspecified     Current Outpatient Prescriptions  Medication Sig Dispense Refill  . acetaminophen (TYLENOL) 325 MG tablet Take 650 mg by mouth every 6 (six) hours as needed.      Marland Kitchen amLODipine (NORVASC) 5 MG tablet Take 1 tablet (5 mg total) by mouth daily. 90 tablet 3  . aspirin EC 81 MG tablet Take 81 mg by mouth daily.      Marland Kitchen buPROPion (ZYBAN) 150 MG 12 hr tablet Take 150 mg by mouth daily.    . fish oil-omega-3 fatty acids 1000 MG capsule Take 2 g by mouth daily.    Marland Kitchen loratadine (CLARITIN) 10 MG tablet Take 10 mg by mouth daily.    . nitroGLYCERIN (NITROSTAT) 0.4 MG SL tablet Place 1 tablet (0.4 mg total) under the tongue every 5 (five) minutes as needed for chest pain. 75  tablet 3  . omeprazole (PRILOSEC) 40 MG capsule Take 40 mg by mouth 2 (two) times daily.    . rosuvastatin (CRESTOR) 10 MG tablet Take 0.5 tablets (5 mg total) by mouth daily. 45 tablet 3  . sildenafil (VIAGRA) 50 MG tablet Take 50 mg by mouth as needed.     Marland Kitchen UNKNOWN TO PATIENT GLUCOSAMINE TWICE DAILY    . VITAMIN D, ERGOCALCIFEROL, PO Take by mouth. VA SENT TO PT     No current facility-administered medications for this visit.    Allergies:  Iodinated diagnostic agents and Sulfa drugs cross reactors   Social History: The patient  reports that he quit smoking about 13 years ago. His smoking use included Cigarettes. He has a 40.00 pack-year smoking history. He has never used smokeless tobacco. He reports that he does not drink alcohol or use drugs.   ROS:  Please see the history of present illness. Otherwise, complete review of systems is positive for chronic foot and lower leg discomfort.  All other systems are reviewed and negative.   Physical Exam: VS:  BP 120/78   Pulse (!) 59   Ht 5\' 7"  (1.702 m)   Wt 250 lb (113.4 kg)   SpO2 98%   BMI 39.16 kg/m , BMI Body mass index is 39.16 kg/m.  Wt Readings from Last 3  Encounters:  03/17/16 250 lb (113.4 kg)  08/06/15 246 lb 9.6 oz (111.9 kg)  04/29/15 241 lb 12.8 oz (109.7 kg)    General: Overweight male, appears comfortable at rest. HEENT: Conjunctiva and lids normal, oropharynx clear. Neck: Supple, no elevated JVP or carotid bruits, no thyromegaly. Lungs: Clear to auscultation, nonlabored breathing at rest. Cardiac: Regular rate and rhythm, no S3 or significant systolic murmur, no pericardial rub. Abdomen: Protuberant, nontender, bowel sounds present, no guarding or rebound. Well-healed lumbar incision. Extremities: No pitting edema, distal pulses 2+.  ECG: I personally reviewed the tracing from 08/06/2015 which showed normal sinus rhythm.  Other Studies Reviewed Today:  Lexiscan Cardiolite 08/16/2015:  There was no ST segment  deviation noted during stress.  The study is normal.  This is a low risk study.  The left ventricular ejection fraction is normal (55-65%).  Assessment and Plan:  1. Symptomatically stable CAD with diagonal stenosis that has been managed medically. No changes made present regimen. We will continue observation.  2. Essential hypertension, blood pressure is well controlled today.  Current medicines were reviewed with the patient today.  Disposition: Follow-up with me in 6 months.  Signed, Satira Sark, MD, [facility] 03/17/2016 11:13 AM    [facility] at [place], [place], [place] [address] Phone: [phone]; Fax: [phone]

## 2016-03-17 ENCOUNTER — Ambulatory Visit (INDEPENDENT_AMBULATORY_CARE_PROVIDER_SITE_OTHER): Payer: MEDICARE | Admitting: Cardiology

## 2016-03-17 ENCOUNTER — Encounter: Payer: Self-pay | Admitting: Cardiology

## 2016-03-17 VITALS — BP 120/78 | HR 59 | Ht 67.0 in | Wt 250.0 lb

## 2016-03-17 DIAGNOSIS — I10 Essential (primary) hypertension: Secondary | ICD-10-CM

## 2016-03-17 DIAGNOSIS — I25709 Atherosclerosis of coronary artery bypass graft(s), unspecified, with unspecified angina pectoris: Secondary | ICD-10-CM

## 2016-03-17 DIAGNOSIS — I2510 Atherosclerotic heart disease of native coronary artery without angina pectoris: Secondary | ICD-10-CM | POA: Diagnosis not present

## 2016-03-17 NOTE — Patient Instructions (Signed)

## 2016-08-14 DIAGNOSIS — M545 Low back pain: Secondary | ICD-10-CM | POA: Diagnosis not present

## 2016-09-12 ENCOUNTER — Other Ambulatory Visit: Payer: Self-pay | Admitting: Cardiology

## 2016-09-12 MED ORDER — AMLODIPINE BESYLATE 5 MG PO TABS: 5.0000 mg | ORAL_TABLET | Freq: Every day | ORAL | 3 refills | Status: DC

## 2016-09-12 NOTE — Telephone Encounter (Signed)
Refill:   amLODipine (NORVASC) 5 MG   Patient is requesting to have this sent to the [facility], [place]

## 2016-09-12 NOTE — Telephone Encounter (Signed)
OV note & script faxed to [facility].    [id number]

## 2016-09-20 DIAGNOSIS — N5314 Retrograde ejaculation: Secondary | ICD-10-CM | POA: Diagnosis not present

## 2016-09-20 NOTE — Progress Notes (Signed)
Cardiology Office Note  Date: 09/21/2016   ID: David Combs, David Combs Nov 26, 1945, MRN [id number]  PCP: Glenda Chroman, MD  Primary Cardiologist: Rozann Lesches, MD   Chief Complaint  Patient presents with  . Coronary Artery Disease    History of Present Illness: David Combs is a 71 y.o. male last seen in August 2017. He presents for a routine follow-up visit. Reports no clear-cut angina symptoms or nitroglycerin use on medical therapy. He underwent back surgery in November of last year, no obvious perioperative cardiac events.  We went over his medications. Cardiac regimen includes aspirin, Norvasc, Crestor, and as needed nitroglycerin. I reviewed his ECG today which shows normal sinus rhythm.  He continues to follow with [facility].  Past Medical History:  Diagnosis Date  . Coronary atherosclerosis of native coronary artery    70-80% small diagonal - medically managed, LVEF 55%  . Dermatophytosis of nail   . ED (erectile dysfunction)   . Essential hypertension, benign   . GERD (gastroesophageal reflux disease)   . GERD (gastroesophageal reflux disease)   . Lumbago   . Mixed hyperlipidemia   . Non-ST elevation myocardial infarction (NSTEMI) ([place])    11/11  . Obesity   . Prostatitis, unspecified     Past Surgical History:  Procedure Laterality Date  . APPENDECTOMY    . CERVICAL SPINE SURGERY    . CHOLECYSTECTOMY     05/2014. rt upper quadrant pain   . ESOPHAGOGASTRODUODENOSCOPY N/A 12/02/2014   Procedure: ESOPHAGOGASTRODUODENOSCOPY (EGD);  Surgeon: Rogene Houston, MD;  [facility];  Service: Endoscopy;  Laterality: N/A;  200  . Left wrist fracture surgery  1994  . Right knee surgery    . TONSILLECTOMY  AGE 38    Current Outpatient Prescriptions  Medication Sig Dispense Refill  . acetaminophen (TYLENOL) 325 MG tablet Take 650 mg by mouth every 6 (six) hours as needed.      Marland Kitchen amLODipine (NORVASC) 5 MG tablet Take 1 tablet (5 mg total) by  mouth daily. 90 tablet 3  . aspirin EC 81 MG tablet Take 81 mg by mouth daily.      . DULoxetine (CYMBALTA) 60 MG capsule Take 60 mg by mouth daily.    . fish oil-omega-3 fatty acids 1000 MG capsule Take 2 g by mouth daily.    Marland Kitchen gabapentin (NEURONTIN) 300 MG capsule Take 300 mg by mouth daily.    Marland Kitchen loratadine (CLARITIN) 10 MG tablet Take 10 mg by mouth daily.    . nitroGLYCERIN (NITROSTAT) 0.4 MG SL tablet Place 1 tablet (0.4 mg total) under the tongue every 5 (five) minutes as needed for chest pain. 75 tablet 3  . omeprazole (PRILOSEC) 20 MG capsule Take 20 mg by mouth daily.    . rosuvastatin (CRESTOR) 10 MG tablet Take 0.5 tablets (5 mg total) by mouth daily. 45 tablet 3  . sildenafil (VIAGRA) 50 MG tablet Take 50 mg by mouth as needed.     . tamsulosin (FLOMAX) 0.4 MG CAPS capsule Take 0.4 mg by mouth daily.    Marland Kitchen UNKNOWN TO PATIENT GLUCOSAMINE TWICE DAILY    . VITAMIN D, ERGOCALCIFEROL, PO Take by mouth. VA SENT TO PT     No current facility-administered medications for this visit.    Allergies:  Iodinated diagnostic agents and Sulfa drugs cross reactors   Social History: The patient  reports that he quit smoking about 14 years ago. His smoking use included Cigarettes. He  has a 40.00 pack-year smoking history. He has never used smokeless tobacco. He reports that he does not drink alcohol or use drugs.   ROS:  Please see the history of present illness. Otherwise, complete review of systems is positive for chronic back pain, anxiety.  All other systems are reviewed and negative.   Physical Exam: VS:  BP 117/76   Pulse 71   Ht 5\' 7"  (1.702 m)   Wt 243 lb 9.6 oz (110.5 kg)   SpO2 95%   BMI 38.15 kg/m , BMI Body mass index is 38.15 kg/m.  Wt Readings from Last 3 Encounters:  09/21/16 243 lb 9.6 oz (110.5 kg)  03/17/16 250 lb (113.4 kg)  08/06/15 246 lb 9.6 oz (111.9 kg)    General: Overweight male, appears comfortable at rest. HEENT: Conjunctiva and lids normal, oropharynx  clear. Neck: Supple, no elevated JVP or carotid bruits, no thyromegaly. Lungs: Clear to auscultation, nonlabored breathing at rest. Cardiac: Regular rate and rhythm, no S3 or significant systolic murmur, no pericardial rub. Abdomen: Protuberant, nontender, bowel sounds present, no guarding or rebound. Well-healed lumbar incision. Extremities: No pitting edema, distal pulses 2+.  ECG: I personally reviewed the tracing from 08/06/2015 which showed normal sinus rhythm.  Other Studies Reviewed Today:  Lexiscan Cardiolite 08/16/2015:  There was no ST segment deviation noted during stress.  The study is normal.  This is a low risk study.  The left ventricular ejection fraction is normal (55-65%).  Assessment and Plan:  1. Predominantly branch vessel CAD based on prior workup, symptomatically stable on medical therapy. Cardiolite study from last year was low risk. ECG today normal. Continue observation.  2. Essential hypertension, blood pressure is normal today.  Current medicines were reviewed with the patient today.   Orders Placed This Encounter  Procedures  . EKG 12-Lead    Disposition: Follow-up in 6 months.  Signed, Satira Sark, MD, [facility] 09/21/2016 3:44 PM    [place] [facility] [place], [place], [place] [address] Phone: [phone]; Fax: [phone]

## 2016-09-21 ENCOUNTER — Encounter: Payer: Self-pay | Admitting: Cardiology

## 2016-09-21 ENCOUNTER — Ambulatory Visit (INDEPENDENT_AMBULATORY_CARE_PROVIDER_SITE_OTHER): Payer: MEDICARE | Admitting: Cardiology

## 2016-09-21 VITALS — BP 117/76 | HR 71 | Ht 67.0 in | Wt 243.6 lb

## 2016-09-21 DIAGNOSIS — I2510 Atherosclerotic heart disease of native coronary artery without angina pectoris: Secondary | ICD-10-CM

## 2016-09-21 DIAGNOSIS — I10 Essential (primary) hypertension: Secondary | ICD-10-CM | POA: Diagnosis not present

## 2016-09-21 NOTE — Patient Instructions (Signed)
Your physician wants you to follow-up in: 6 months with Dr. McDowell You will receive a reminder letter in the mail two months in advance. If you don't receive a letter, please call our office to schedule the follow-up appointment.  Your physician recommends that you continue on your current medications as directed. Please refer to the Current Medication list given to you today.  Thank you for choosing [facility]!!    

## 2016-10-24 DIAGNOSIS — H26492 Other secondary cataract, left eye: Secondary | ICD-10-CM | POA: Diagnosis not present

## 2016-10-24 DIAGNOSIS — H524 Presbyopia: Secondary | ICD-10-CM | POA: Diagnosis not present

## 2016-11-09 DIAGNOSIS — R5383 Other fatigue: Secondary | ICD-10-CM | POA: Diagnosis not present

## 2016-11-09 DIAGNOSIS — Z87891 Personal history of nicotine dependence: Secondary | ICD-10-CM | POA: Diagnosis not present

## 2016-11-09 DIAGNOSIS — Z79899 Other long term (current) drug therapy: Secondary | ICD-10-CM | POA: Diagnosis not present

## 2016-11-09 DIAGNOSIS — Z125 Encounter for screening for malignant neoplasm of prostate: Secondary | ICD-10-CM | POA: Diagnosis not present

## 2016-11-09 DIAGNOSIS — N400 Enlarged prostate without lower urinary tract symptoms: Secondary | ICD-10-CM | POA: Diagnosis not present

## 2016-11-09 DIAGNOSIS — Z7189 Other specified counseling: Secondary | ICD-10-CM | POA: Diagnosis not present

## 2016-11-09 DIAGNOSIS — Z1389 Encounter for screening for other disorder: Secondary | ICD-10-CM | POA: Diagnosis not present

## 2016-11-09 DIAGNOSIS — Z0000 Encounter for general adult medical examination without abnormal findings: Secondary | ICD-10-CM | POA: Diagnosis not present

## 2016-11-09 DIAGNOSIS — Z299 Encounter for prophylactic measures, unspecified: Secondary | ICD-10-CM | POA: Diagnosis not present

## 2016-11-09 DIAGNOSIS — I10 Essential (primary) hypertension: Secondary | ICD-10-CM | POA: Diagnosis not present

## 2016-11-09 DIAGNOSIS — Z1211 Encounter for screening for malignant neoplasm of colon: Secondary | ICD-10-CM | POA: Diagnosis not present

## 2016-11-09 DIAGNOSIS — E7800 Pure hypercholesterolemia, unspecified: Secondary | ICD-10-CM | POA: Diagnosis not present

## 2016-11-09 DIAGNOSIS — I2510 Atherosclerotic heart disease of native coronary artery without angina pectoris: Secondary | ICD-10-CM | POA: Diagnosis not present

## 2017-01-24 IMAGING — NM NM MYOCAR MULTI W/SPECT W/WALL MOTION & EF
2 series · 12 of 12 positions shown · non-contrast
Comparison: none

[Series 1: rest · 8.28mm/px · 6 of 64 frames shown]
[frame 6/64]
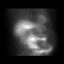
[frame 16/64]
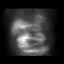
[frame 27/64]
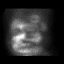
[frame 38/64]
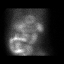
[frame 48/64]
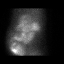
[frame 59/64]
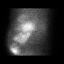

[Series 2: stress gated · 8.28mm/px · 6 of 512 frames shown]
[frame 43/512]
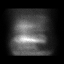
[frame 128/512]
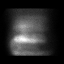
[frame 214/512]
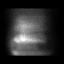
[frame 299/512]
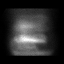
[frame 384/512]
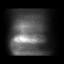
[frame 470/512]
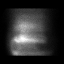

[12 of 12 positions shown; findings below may reference images not displayed]

Canned report from images found in remote index.

Refer to host system for actual result text.

## 2017-03-14 DIAGNOSIS — M159 Polyosteoarthritis, unspecified: Secondary | ICD-10-CM | POA: Diagnosis not present

## 2017-03-14 DIAGNOSIS — N400 Enlarged prostate without lower urinary tract symptoms: Secondary | ICD-10-CM | POA: Diagnosis not present

## 2017-03-14 DIAGNOSIS — I2510 Atherosclerotic heart disease of native coronary artery without angina pectoris: Secondary | ICD-10-CM | POA: Diagnosis not present

## 2017-03-14 DIAGNOSIS — I10 Essential (primary) hypertension: Secondary | ICD-10-CM | POA: Diagnosis not present

## 2017-03-14 DIAGNOSIS — Z299 Encounter for prophylactic measures, unspecified: Secondary | ICD-10-CM | POA: Diagnosis not present

## 2017-03-14 DIAGNOSIS — E7800 Pure hypercholesterolemia, unspecified: Secondary | ICD-10-CM | POA: Diagnosis not present

## 2017-03-14 DIAGNOSIS — G4730 Sleep apnea, unspecified: Secondary | ICD-10-CM | POA: Diagnosis not present

## 2017-03-14 DIAGNOSIS — M25512 Pain in left shoulder: Secondary | ICD-10-CM | POA: Diagnosis not present

## 2017-03-19 NOTE — Progress Notes (Signed)
Cardiology Office Note  Date: 03/20/2017   ID: David, Combs 02/01/1946, MRN [id number]  PCP: Glenda Chroman, MD  Primary Cardiologist: Rozann Lesches, MD   Chief Complaint  Patient presents with  . Coronary Artery Disease    History of Present Illness: David Combs is a 71 y.o. male last seen in February. He presents for a routine follow-up visit. Reports occasional chest pain with typical and atypical features for angina. He has had some lightheadedness recently while playing golf, although has been very hot and humid. This has occurred when he is standing. He reports no palpitations or syncope.  I reviewed his medications which are stable from a cardiac perspective and outlined below. Current blood pressure heart rate are also well controlled.  He continues on Crestor with follow-up lipids per Dr. Woody Seller.  Past Medical History:  Diagnosis Date  . Coronary atherosclerosis of native coronary artery    70-80% small diagonal - medically managed, LVEF 55%  . Dermatophytosis of nail   . ED (erectile dysfunction)   . Essential hypertension, benign   . GERD (gastroesophageal reflux disease)   . GERD (gastroesophageal reflux disease)   . Lumbago   . Mixed hyperlipidemia   . Non-ST elevation myocardial infarction (NSTEMI) ([place])    11/11  . Obesity   . Prostatitis, unspecified     Past Surgical History:  Procedure Laterality Date  . APPENDECTOMY    . CERVICAL SPINE SURGERY    . CHOLECYSTECTOMY     05/2014. rt upper quadrant pain   . ESOPHAGOGASTRODUODENOSCOPY N/A 12/02/2014   Procedure: ESOPHAGOGASTRODUODENOSCOPY (EGD);  Surgeon: Rogene Houston, MD;  [facility];  Service: Endoscopy;  Laterality: N/A;  200  . Left wrist fracture surgery  1994  . Right knee surgery    . TONSILLECTOMY  AGE 18    Current Outpatient Prescriptions  Medication Sig Dispense Refill  . acetaminophen (TYLENOL) 325 MG tablet Take 650 mg by mouth every 6 (six) hours as needed.       Marland Kitchen amLODipine (NORVASC) 10 MG tablet Take 5 mg by mouth daily.    Marland Kitchen aspirin EC 81 MG tablet Take 81 mg by mouth daily.      . Cholecalciferol (VITAMIN D3) 1000 units CAPS Take 1 capsule by mouth 2 (two) times daily.    Marland Kitchen loratadine (CLARITIN) 10 MG tablet Take 10 mg by mouth daily.    . nitroGLYCERIN (NITROSTAT) 0.4 MG SL tablet Place 1 tablet (0.4 mg total) under the tongue every 5 (five) minutes as needed for chest pain. 75 tablet 3  . omeprazole (PRILOSEC) 20 MG capsule Take 20 mg by mouth 2 (two) times daily.     . rosuvastatin (CRESTOR) 5 MG tablet Take 5 mg by mouth every other day.    . sildenafil (VIAGRA) 50 MG tablet Take 50 mg by mouth as needed.     . tamsulosin (FLOMAX) 0.4 MG CAPS capsule Take 0.4 mg by mouth daily.    Marland Kitchen UNKNOWN TO PATIENT GLUCOSAMINE TWICE DAILY    . DULoxetine (CYMBALTA) 60 MG capsule Take 60 mg by mouth daily.    Marland Kitchen gabapentin (NEURONTIN) 300 MG capsule Take 300 mg by mouth daily.     No current facility-administered medications for this visit.    Allergies:  Iodinated diagnostic agents and Sulfa drugs cross reactors   Social History: The patient  reports that he quit smoking about 14 years ago. His smoking use included Cigarettes. He has  a 40.00 pack-year smoking history. He has never used smokeless tobacco. He reports that he does not drink alcohol or use drugs.   ROS:  Please see the history of present illness. Otherwise, complete review of systems is positive for none.  All other systems are reviewed and negative.   Physical Exam: VS:  BP 115/74   Pulse (!) 58   Ht 5\' 7"  (1.702 m)   Wt 237 lb (107.5 kg)   SpO2 96%   BMI 37.12 kg/m , BMI Body mass index is 37.12 kg/m.  Wt Readings from Last 3 Encounters:  03/20/17 237 lb (107.5 kg)  09/21/16 243 lb 9.6 oz (110.5 kg)  03/17/16 250 lb (113.4 kg)    General: Overweight male, appears comfortable at rest. HEENT: Conjunctiva and lids normal, oropharynx clear. Neck: Supple, no elevated JVP or carotid  bruits, no thyromegaly. Lungs: Clear to auscultation, nonlabored breathing at rest. Cardiac: Regular rate and rhythm, no S3 or significant systolic murmur, no pericardial rub. Abdomen: Protuberant, nontender, bowel sounds present, no guarding or rebound. Well-healed lumbar incision. Extremities: No pitting edema, distal pulses 2+.  ECG: I personally reviewed the tracing from 09/21/2016 which showed normal sinus rhythm.  Other Studies Reviewed Today:  Lexiscan Cardiolite 08/16/2015:  There was no ST segment deviation noted during stress.  The study is normal.  This is a low risk study.  The left ventricular ejection fraction is normal (55-65%).  Assessment and Plan:  1. CAD, predominantly branch vessel and being managed medically with low risk Cardiolite study done in follow-up last year. No changes made to current regimen with good heart rate and blood pressure control.  2. Hyperlipidemia, continues on Crestor, requesting recent lab work from Dr. Woody Seller.  3. Essential hypertension, blood pressure is well controlled today.  Current medicines were reviewed with the patient today.  Disposition: Follow-up in 6 months.  Signed, Satira Sark, MD, [facility] 03/20/2017 3:58 PM    [place] at [place], [place], [place] [address] Phone: [phone]; Fax: [phone]

## 2017-03-20 ENCOUNTER — Encounter: Payer: Self-pay | Admitting: Cardiology

## 2017-03-20 ENCOUNTER — Ambulatory Visit (INDEPENDENT_AMBULATORY_CARE_PROVIDER_SITE_OTHER): Payer: MEDICARE | Admitting: Cardiology

## 2017-03-20 VITALS — BP 115/74 | HR 58 | Ht 67.0 in | Wt 237.0 lb

## 2017-03-20 DIAGNOSIS — E782 Mixed hyperlipidemia: Secondary | ICD-10-CM

## 2017-03-20 DIAGNOSIS — I10 Essential (primary) hypertension: Secondary | ICD-10-CM | POA: Diagnosis not present

## 2017-03-20 DIAGNOSIS — I2510 Atherosclerotic heart disease of native coronary artery without angina pectoris: Secondary | ICD-10-CM | POA: Diagnosis not present

## 2017-03-20 NOTE — Patient Instructions (Signed)

## 2017-04-17 DIAGNOSIS — I2510 Atherosclerotic heart disease of native coronary artery without angina pectoris: Secondary | ICD-10-CM | POA: Diagnosis not present

## 2017-04-17 DIAGNOSIS — J069 Acute upper respiratory infection, unspecified: Secondary | ICD-10-CM | POA: Diagnosis not present

## 2017-04-17 DIAGNOSIS — I10 Essential (primary) hypertension: Secondary | ICD-10-CM | POA: Diagnosis not present

## 2017-04-17 DIAGNOSIS — H6000 Abscess of external ear, unspecified ear: Secondary | ICD-10-CM | POA: Diagnosis not present

## 2017-04-17 DIAGNOSIS — F4310 Post-traumatic stress disorder, unspecified: Secondary | ICD-10-CM | POA: Diagnosis not present

## 2017-04-17 DIAGNOSIS — Z6836 Body mass index (BMI) 36.0-36.9, adult: Secondary | ICD-10-CM | POA: Diagnosis not present

## 2017-04-17 DIAGNOSIS — Z299 Encounter for prophylactic measures, unspecified: Secondary | ICD-10-CM | POA: Diagnosis not present

## 2017-05-01 DIAGNOSIS — Z23 Encounter for immunization: Secondary | ICD-10-CM | POA: Diagnosis not present

## 2017-05-02 ENCOUNTER — Emergency Department (HOSPITAL_COMMUNITY): Payer: MEDICARE

## 2017-05-02 ENCOUNTER — Encounter (HOSPITAL_COMMUNITY): Payer: Self-pay | Admitting: *Deleted

## 2017-05-02 ENCOUNTER — Telehealth: Payer: Self-pay | Admitting: Cardiology

## 2017-05-02 ENCOUNTER — Observation Stay (HOSPITAL_COMMUNITY)
Admission: EM | Admit: 2017-05-02 | Discharge: 2017-05-03 | Disposition: A | Discharge status: Home / Self Care | Payer: MEDICARE | Attending: Internal Medicine | Admitting: Internal Medicine

## 2017-05-02 DIAGNOSIS — K219 Gastro-esophageal reflux disease without esophagitis: Secondary | ICD-10-CM | POA: Diagnosis not present

## 2017-05-02 DIAGNOSIS — Z7982 Long term (current) use of aspirin: Secondary | ICD-10-CM | POA: Diagnosis not present

## 2017-05-02 DIAGNOSIS — I200 Unstable angina: Principal | ICD-10-CM

## 2017-05-02 DIAGNOSIS — E785 Hyperlipidemia, unspecified: Secondary | ICD-10-CM | POA: Diagnosis not present

## 2017-05-02 DIAGNOSIS — Z87891 Personal history of nicotine dependence: Secondary | ICD-10-CM | POA: Insufficient documentation

## 2017-05-02 DIAGNOSIS — I25118 Atherosclerotic heart disease of native coronary artery with other forms of angina pectoris: Secondary | ICD-10-CM

## 2017-05-02 DIAGNOSIS — I2510 Atherosclerotic heart disease of native coronary artery without angina pectoris: Secondary | ICD-10-CM | POA: Diagnosis present

## 2017-05-02 DIAGNOSIS — R079 Chest pain, unspecified: Secondary | ICD-10-CM

## 2017-05-02 DIAGNOSIS — R072 Precordial pain: Secondary | ICD-10-CM | POA: Diagnosis present

## 2017-05-02 DIAGNOSIS — Z79899 Other long term (current) drug therapy: Secondary | ICD-10-CM | POA: Diagnosis not present

## 2017-05-02 DIAGNOSIS — I10 Essential (primary) hypertension: Secondary | ICD-10-CM

## 2017-05-02 LAB — BASIC METABOLIC PANEL
Anion gap: 9 (ref 5–15)
BUN: 17 mg/dL (ref 6–20)
CALCIUM: 9.2 mg/dL (ref 8.9–10.3)
CO2: 26 mmol/L (ref 22–32)
CREATININE: 0.89 mg/dL (ref 0.61–1.24)
Chloride: 103 mmol/L (ref 101–111)
Glucose, Bld: 83 mg/dL (ref 65–99)
POTASSIUM: 4.1 mmol/L (ref 3.5–5.1)
SODIUM: 138 mmol/L (ref 135–145)

## 2017-05-02 LAB — TROPONIN I: Troponin I: <0.03 ng/mL (ref <0.03)

## 2017-05-02 LAB — CBC
HCT: 43.1 % (ref 39.0–52.0)
Hemoglobin: 14.6 g/dL (ref 13.0–17.0)
MCH: 33.2 pg (ref 26.0–34.0)
MCHC: 33.9 g/dL (ref 30.0–36.0)
MCV: 98 fL (ref 78.0–100.0)
PLATELETS: 126 10*3/uL — AB (ref 150–400)
RBC: 4.4 MIL/uL (ref 4.22–5.81)
RDW: 12.6 % (ref 11.5–15.5)
WBC: 4.7 10*3/uL (ref 4.0–10.5)

## 2017-05-02 MED ORDER — ONDANSETRON HCL 4 MG/2ML IJ SOLN: 4.0000 mg | Freq: Four times a day (QID) | INTRAMUSCULAR | Status: DC | PRN

## 2017-05-02 MED ORDER — DULOXETINE HCL 60 MG PO CPEP
60.0000 mg | ORAL_CAPSULE | Freq: Every day | ORAL | Status: DC
  Filled 2017-05-02: qty 1

## 2017-05-02 MED ORDER — ACETAMINOPHEN 325 MG PO TABS: 650.0000 mg | ORAL_TABLET | ORAL | Status: DC | PRN

## 2017-05-02 MED ORDER — VITAMIN D3 25 MCG (1000 UT) PO CAPS: 1.0000 | ORAL_CAPSULE | Freq: Two times a day (BID) | ORAL | Status: DC

## 2017-05-02 MED ORDER — VITAMIN D 1000 UNITS PO TABS
1000.0000 [IU] | ORAL_TABLET | Freq: Two times a day (BID) | ORAL | Status: DC
  Filled 2017-05-02 (×2): qty 1

## 2017-05-02 MED ORDER — ASPIRIN 81 MG PO CHEW
243.0000 mg | CHEWABLE_TABLET | Freq: Once | ORAL | Status: AC
  Administered 2017-05-02: 243 mg via ORAL
  Filled 2017-05-02: qty 3

## 2017-05-02 MED ORDER — ROSUVASTATIN CALCIUM 10 MG PO TABS
5.0000 mg | ORAL_TABLET | ORAL | Status: DC
  Filled 2017-05-02: qty 1

## 2017-05-02 MED ORDER — ASPIRIN EC 81 MG PO TBEC
81.0000 mg | DELAYED_RELEASE_TABLET | Freq: Every day | ORAL | Status: DC
  Filled 2017-05-02 (×2): qty 1

## 2017-05-02 MED ORDER — AMLODIPINE BESYLATE 5 MG PO TABS
5.0000 mg | ORAL_TABLET | Freq: Every day | ORAL | Status: DC
  Filled 2017-05-02: qty 1

## 2017-05-02 MED ORDER — ENOXAPARIN SODIUM 40 MG/0.4ML ~~LOC~~ SOLN
40.0000 mg | SUBCUTANEOUS | Status: DC
  Filled 2017-05-02: qty 0.4

## 2017-05-02 MED ORDER — NITROGLYCERIN 0.4 MG SL SUBL
0.4000 mg | SUBLINGUAL_TABLET | SUBLINGUAL | Status: DC | PRN
Start: 2017-05-02 — End: 2017-05-03
  Filled 2017-05-02: qty 1

## 2017-05-02 MED ORDER — TAMSULOSIN HCL 0.4 MG PO CAPS
0.4000 mg | ORAL_CAPSULE | Freq: Every day | ORAL | Status: DC
  Filled 2017-05-02 (×2): qty 1

## 2017-05-02 MED ORDER — PANTOPRAZOLE SODIUM 40 MG PO TBEC: 40.0000 mg | DELAYED_RELEASE_TABLET | Freq: Two times a day (BID) | ORAL | Status: DC

## 2017-05-02 MED ORDER — LORATADINE 10 MG PO TABS
10.0000 mg | ORAL_TABLET | Freq: Every day | ORAL | Status: DC
  Filled 2017-05-02 (×2): qty 1

## 2017-05-02 NOTE — ED Notes (Signed)
ok'd by EDP for pt to eat

## 2017-05-02 NOTE — H&P (Signed)
History and Physical    David Combs [id number] DOB: 1945/09/23 DOA: 05/02/2017  PCP: Glenda Chroman, MD  Patient coming from: Home  I have personally briefly reviewed patient's old medical records in [place]  Chief Complaint: Chest pain  HPI: David Combs is a 71 y.o. male with medical history significant of coronary artery disease, hypertension presents to the emergency room with complaints of discomfort. He reports his discomfort radiated to his right arm with associated diaphoresis and nausea. Symptoms have since resolved and he now he has residual soreness. He has not had any fever, cough, vomiting, diarrhea.  ED Course: Cardiac enzymes were noted to be negative. EKG did not show any acute changes. Basic labs were also unrevealing. Chest x-ray did not show any acute findings.  Review of Systems: As per HPI otherwise 10 point review of systems negative.    Past Medical History:  Diagnosis Date  . Coronary atherosclerosis of native coronary artery    70-80% small diagonal - medically managed, LVEF 55%  . Dermatophytosis of nail   . ED (erectile dysfunction)   . Essential hypertension, benign   . GERD (gastroesophageal reflux disease)   . GERD (gastroesophageal reflux disease)   . Lumbago   . Mixed hyperlipidemia   . Non-ST elevation myocardial infarction (NSTEMI) ([place])    11/11  . Obesity   . Prostatitis, unspecified     Past Surgical History:  Procedure Laterality Date  . APPENDECTOMY    . CERVICAL SPINE SURGERY    . CHOLECYSTECTOMY     05/2014. rt upper quadrant pain   . ESOPHAGOGASTRODUODENOSCOPY N/A 12/02/2014   Procedure: ESOPHAGOGASTRODUODENOSCOPY (EGD);  Surgeon: Rogene Houston, MD;  [facility];  Service: Endoscopy;  Laterality: N/A;  200  . Left wrist fracture surgery  1994  . Right knee surgery    . TONSILLECTOMY  AGE 72     reports that he quit smoking about 14 years ago. His smoking use included Cigarettes. He has a 40.00  pack-year smoking history. He has never used smokeless tobacco. He reports that he does not drink alcohol or use drugs.  Allergies  Allergen Reactions  . Iodinated Diagnostic Agents Swelling  . Sulfa Drugs Cross Reactors Itching   Family history: Family history reviewed and not pertinent   Prior to Admission medications   Medication Sig Start Date End Date Taking? Authorizing Provider  acetaminophen (TYLENOL) 325 MG tablet Take 650 mg by mouth every 6 (six) hours as needed.     Yes [provider]  amLODipine (NORVASC) 10 MG tablet Take 5 mg by mouth daily.   Yes [provider]  aspirin EC 81 MG tablet Take 81 mg by mouth daily.     Yes [provider]  Cholecalciferol (VITAMIN D3) 1000 units CAPS Take 1 capsule by mouth 2 (two) times daily.   Yes [provider]  DULoxetine (CYMBALTA) 60 MG capsule Take 60 mg by mouth daily.   Yes [provider]  Influenza vac split quadrivalent PF (FLUZONE HIGH-DOSE) 0.5 ML injection Inject 0.5 mLs into the muscle once.   Yes [provider]  loratadine (CLARITIN) 10 MG tablet Take 10 mg by mouth daily.   Yes [provider]  meloxicam (MOBIC) 15 MG tablet Take 15 mg by mouth daily. 03/15/17  Yes [provider]  nitroGLYCERIN (NITROSTAT) 0.4 MG SL tablet Place 1 tablet (0.4 mg total) under the tongue every 5 (five) minutes as needed for chest  pain. 08/06/15  Yes Satira Sark, MD  omeprazole (PRILOSEC) 20 MG capsule Take 20 mg by mouth 2 (two) times daily.    Yes [provider]  rosuvastatin (CRESTOR) 5 MG tablet Take 5 mg by mouth every other day.   Yes [provider]  sildenafil (VIAGRA) 50 MG tablet Take 50 mg by mouth as needed.    Yes [provider]  tamsulosin (FLOMAX) 0.4 MG CAPS capsule Take 0.4 mg by mouth daily.   Yes [provider]  UNKNOWN TO PATIENT GLUCOSAMINE TWICE DAILY   Yes [provider]  amoxicillin-clavulanate  (AUGMENTIN) 500-125 MG tablet Take 1 tablet by mouth 3 (three) times daily. 04/17/17   [provider]  predniSONE (DELTASONE) 10 MG tablet Take 10 mg by mouth 2 (two) times daily. 04/17/17   [provider]    Physical Exam: Vitals:   05/02/17 1530 05/02/17 1545 05/02/17 1615 05/02/17 1630  BP: 108/87   111/61  Pulse:  60 (!) 59 65  Resp: 19 17 14 16   Temp:      TempSrc:      SpO2:  98% 96% 98%  Weight:      Height:        Constitutional: NAD, calm, comfortable Vitals:   05/02/17 1530 05/02/17 1545 05/02/17 1615 05/02/17 1630  BP: 108/87   111/61  Pulse:  60 (!) 59 65  Resp: 19 17 14 16   Temp:      TempSrc:      SpO2:  98% 96% 98%  Weight:      Height:       Eyes: PERRL, lids and conjunctivae normal ENMT: Mucous membranes are moist. Posterior pharynx clear of any exudate or lesions.Normal dentition.  Neck: normal, supple, no masses, no thyromegaly Respiratory: clear to auscultation bilaterally, no wheezing, no crackles. Normal respiratory effort. No accessory muscle use.  Cardiovascular: Regular rate and rhythm, no murmurs / rubs / gallops. No extremity edema. 2+ pedal pulses. No carotid bruits.  Abdomen: no tenderness, no masses palpated. No hepatosplenomegaly. Bowel sounds positive.  Musculoskeletal: no clubbing / cyanosis. No joint deformity upper and lower extremities. Good ROM, no contractures. Normal muscle tone.  Skin: no rashes, lesions, ulcers. No induration Neurologic: CN 2-12 grossly intact. Sensation intact, DTR normal. Strength 5/5 in all 4.  Psychiatric: Normal judgment and insight. Alert and oriented x 3. Normal mood.    Labs on Admission: I have personally reviewed following labs and imaging studies  CBC:  Recent Labs Lab 05/02/17 1232  WBC 4.7  HGB 14.6  HCT 43.1  MCV 98.0  PLT 283*   Basic Metabolic Panel:  Recent Labs Lab 05/02/17 1232  NA 138  K 4.1  CL 103  CO2 26  GLUCOSE 83  BUN 17  CREATININE 0.89  CALCIUM 9.2     GFR: Estimated Creatinine Clearance: 90.4 mL/min (by C-G formula based on SCr of 0.89 mg/dL). Liver Function Tests: No results for input(s): AST, ALT, ALKPHOS, BILITOT, PROT, ALBUMIN in the last 168 hours. No results for input(s): LIPASE, AMYLASE in the last 168 hours. No results for input(s): AMMONIA in the last 168 hours. Coagulation Profile: No results for input(s): INR, PROTIME in the last 168 hours. Cardiac Enzymes:  Recent Labs Lab 05/02/17 1232  TROPONINI <0.03   BNP (last 3 results) No results for input(s): PROBNP in the last 8760 hours. HbA1C: No results for input(s): HGBA1C in the last 72 hours. CBG: No results for input(s): GLUCAP in  the last 168 hours. Lipid Profile: No results for input(s): CHOL, HDL, LDLCALC, TRIG, CHOLHDL, LDLDIRECT in the last 72 hours. Thyroid Function Tests: No results for input(s): TSH, T4TOTAL, FREET4, T3FREE, THYROIDAB in the last 72 hours. Anemia Panel: No results for input(s): VITAMINB12, FOLATE, FERRITIN, TIBC, IRON, RETICCTPCT in the last 72 hours. Urine analysis: No results found for: COLORURINE, APPEARANCEUR, LABSPEC, [place], GLUCOSEU, HGBUR, BILIRUBINUR, KETONESUR, PROTEINUR, UROBILINOGEN, NITRITE, LEUKOCYTESUR  Radiological Exams on Admission: Dg Chest Portable 1 View  Result Date: 05/02/2017 CLINICAL DATA:  Chest pain EXAM: PORTABLE CHEST 1 VIEW COMPARISON:  None. FINDINGS: Mild cardiomegaly is noted but accentuated by the portable technique. The lungs are clear but hypoinflated. No bony abnormality is noted. IMPRESSION: Hypoinflation without acute abnormality. Electronically Signed   By: Inez Catalina M.D.   On: 05/02/2017 13:23    EKG: Independently reviewed. Sinus rhythm with no acute ST or T changes  Assessment/Plan Active Problems:   Coronary atherosclerosis of native coronary artery   Essential hypertension, benign   Hyperlipemia   Chest pain    1. Chest pain. Concerns for underlying angina. Monitor in the  hospital overnight on telemetry. Cycle cardiac markers. Cardiology has been consulted. Further workup depending on results of cardiac markers and patient's symptoms. Will keep patient nothing by mouth after midnight for any further testing to be conducted tomorrow. Continue on aspirin. Will order nitroglycerin sublingual when necessary 2. Hypertension. Blood pressure is currently stable. Continue Norvasc. 3. GERD. Continue on PPI 4. Hyperlipidemia. Continue statin 5. BPH. Continue on Flomax  DVT prophylaxis: lovenox Code Status: full code Family Communication: wife at bedside Disposition Plan: discharge home once improved Consults called: cardiology Admission status: observation, telemetry   MEMON,JEHANZEB MD [facility] Pager [phone]  If 7PM-7AM, please contact night-coverage [email or web address] Password [email or web address]  05/02/2017, 5:26 PM

## 2017-05-02 NOTE — ED Triage Notes (Signed)
Pt c/o mid chest pain that radiates down right arm that started at 0600 this morning. Pt also reports SOB and sweating. Pt has hx of MI in 2010.

## 2017-05-02 NOTE — Consult Note (Signed)
Cardiology Consultation:   Patient ID: David Combs; [id number]; [birth year]   Admit date: 05/02/2017 Date of Consult: 05/02/2017  Primary Care Provider: Glenda Chroman, MD Primary Cardiologist: Dr. Domenic Combs Primary Electrophysiologist:  NA   Patient Profile:   David Combs is a 71 y.o. male with a hx of CAD who is being seen today for the evaluation of chest pain at the request of Dr. Winfred Combs.  History of Present Illness:   David Combs with history of CAD status post cath in 2011 showing 70-80% small diagonal otherwise nonobstructive disease LVEF 55%. Also has hypertension and HLD, obesity. Last saw Dr. Domenic Combs 03/20/17 at which time he was having some typical and atypical chest pain. He had low risk nuclear stress test 08/2015. No changes were made.  Patient awakened from sleep with chest tightness radiating down his right arm associated with diaphoresis and nausea. He was afraid to take a nitroglycerin because he used Viagra 2 days ago. He took his regular a.m. medications and the pain eased. He now just has residual soreness. He hasn't had any chest pain like this in the long time. He has fleeting twinges that are different. He is not very active because of arthritis. He does golf about twice a week and golfs Monday without difficulty. No regular exercise. Has dyspnea on exertion when going up hills. His is not new. Asking to go home.   Past Medical History:  Diagnosis Date  . Coronary atherosclerosis of native coronary artery    70-80% small diagonal - medically managed, LVEF 55%  . Dermatophytosis of nail   . ED (erectile dysfunction)   . Essential hypertension, benign   . GERD (gastroesophageal reflux disease)   . GERD (gastroesophageal reflux disease)   . Lumbago   . Mixed hyperlipidemia   . Non-ST elevation myocardial infarction (NSTEMI) ([place])    11/11  . Obesity   . Prostatitis, unspecified     Past Surgical History:  Procedure Laterality Date  . APPENDECTOMY      . CERVICAL SPINE SURGERY    . CHOLECYSTECTOMY     05/2014. rt upper quadrant pain   . ESOPHAGOGASTRODUODENOSCOPY N/A 12/02/2014   Procedure: ESOPHAGOGASTRODUODENOSCOPY (EGD);  Surgeon: Rogene Houston, MD;  Location: [facility];  Service: Endoscopy;  Laterality: N/A;  200  . Left wrist fracture surgery  1994  . Right knee surgery    . TONSILLECTOMY  AGE 89     Home Medications:  Prior to Admission medications   Medication Sig Start Date End Date Taking? Authorizing Provider  acetaminophen (TYLENOL) 325 MG tablet Take 650 mg by mouth every 6 (six) hours as needed.     Yes [provider]  amLODipine (NORVASC) 10 MG tablet Take 5 mg by mouth daily.   Yes [provider]  aspirin EC 81 MG tablet Take 81 mg by mouth daily.     Yes [provider]  Cholecalciferol (VITAMIN D3) 1000 units CAPS Take 1 capsule by mouth 2 (two) times daily.   Yes [provider]  DULoxetine (CYMBALTA) 60 MG capsule Take 60 mg by mouth daily.   Yes [provider]  Influenza vac split quadrivalent PF (FLUZONE HIGH-DOSE) 0.5 ML injection Inject 0.5 mLs into the muscle once.   Yes [provider]  loratadine (CLARITIN) 10 MG tablet Take 10 mg by mouth daily.   Yes [provider]  meloxicam (MOBIC) 15 MG tablet Take 15 mg by mouth daily. 03/15/17  Yes [provider]  nitroGLYCERIN (NITROSTAT) 0.4 MG SL tablet Place 1 tablet (0.4 mg total) under the tongue every 5 (five) minutes as needed for chest pain. 08/06/15  Yes Satira Sark, MD  omeprazole (PRILOSEC) 20 MG capsule Take 20 mg by mouth 2 (two) times daily.    Yes [provider]  rosuvastatin (CRESTOR) 5 MG tablet Take 5 mg by mouth every other day.   Yes [provider]  sildenafil (VIAGRA) 50 MG tablet Take 50 mg by mouth as needed.    Yes [provider]  tamsulosin (FLOMAX) 0.4 MG CAPS capsule Take 0.4 mg by mouth daily.   Yes [provider]   UNKNOWN TO PATIENT GLUCOSAMINE TWICE DAILY   Yes [provider]  amoxicillin-clavulanate (AUGMENTIN) 500-125 MG tablet Take 1 tablet by mouth 3 (three) times daily. 04/17/17   [provider]  predniSONE (DELTASONE) 10 MG tablet Take 10 mg by mouth 2 (two) times daily. 04/17/17   [provider]    Inpatient Medications: Scheduled Meds:  Continuous Infusions:  PRN Meds: nitroGLYCERIN  Allergies:    Allergies  Allergen Reactions  . Iodinated Diagnostic Agents Swelling  . Sulfa Drugs Cross Reactors Itching    Social History:   Social History   Social History  . Marital status: Married    Spouse name: N/A  . Number of children: N/A  . Years of education: N/A   Occupational History  . Not on file.   Social History Main Topics  . Smoking status: Former Smoker    Packs/day: 1.00    Years: 40.00    Types: Cigarettes    Quit date: 05/31/2002  . Smokeless tobacco: Never Used  . Alcohol use No  . Drug use: No  . Sexual activity: Not on file   Other Topics Concern  . Not on file   Social History Narrative  . No narrative on file    Family History:   No pertinent cardiac history   ROS:  Please see the history of present illness.  Review of Systems  Constitution: Positive for diaphoresis.  HENT: Negative.   Cardiovascular: Positive for chest pain and dyspnea on exertion.  Respiratory: Negative.   Endocrine: Negative.   Hematologic/Lymphatic: Negative.   Musculoskeletal: Positive for arthritis, back pain and joint pain.  Gastrointestinal: Positive for nausea.  Genitourinary: Negative.   Neurological: Negative.      All other ROS reviewed and negative.     Physical Exam/Data:   Vitals:   05/02/17 1345 05/02/17 1400 05/02/17 1415 05/02/17 1430  BP:  129/69  114/72  Pulse: 65 84 61 (!) 58  Resp: 14 17 13 15   Temp:      TempSrc:      SpO2: 97% 97% 97% 96%  Weight:      Height:       No intake or output data in the 24 hours  ending 05/02/17 1437 Filed Weights   05/02/17 1206  Weight: 230 lb (104.3 kg)   Body mass index is 34.97 kg/m.  General:  Obese, in no acute distress HEENT: normal Lymph: no adenopathy Neck: no JVD Endocrine:  No thryomegaly Vascular: No carotid bruits; FA pulses 2+ bilaterally without bruits  Cardiac:  normal S1, S2; RRR; no murmur  Lungs:  clear to auscultation bilaterally, no wheezing, rhonchi or rales  Abd: soft, nontender, no hepatomegaly  Ext: no edema Musculoskeletal:  No deformities, BUE and BLE strength normal and equal Skin: warm and dry  Neuro:  CNs 2-12 intact, no focal abnormalities noted Psych:  Normal affect   EKG:  The EKG was personally reviewed and demonstrates:  Normal sinus rhythm, no acute change Telemetry:  Telemetry was personally reviewed and demonstrates:  Normal sinus rhythm  Relevant CV Studies:  Lexiscan Cardiolite 08/16/2015:  There was no ST segment deviation noted during stress.  The study is normal.  This is a low risk study.  The left ventricular ejection fraction is normal (55-65%).    Laboratory Data:  Chemistry  Recent Labs Lab 05/02/17 1232  NA 138  K 4.1  CL 103  CO2 26  GLUCOSE 83  BUN 17  CREATININE 0.89  CALCIUM 9.2  GFRNONAA >60  GFRAA >60  ANIONGAP 9    No results for input(s): PROT, ALBUMIN, AST, ALT, ALKPHOS, BILITOT in the last 168 hours. Hematology  Recent Labs Lab 05/02/17 1232  WBC 4.7  RBC 4.40  HGB 14.6  HCT 43.1  MCV 98.0  MCH 33.2  MCHC 33.9  RDW 12.6  PLT 126*   Cardiac EnzymesNo results for input(s): TROPONINI in the last 168 hours. No results for input(s): TROPIPOC in the last 168 hours.  BNPNo results for input(s): BNP, PROBNP in the last 168 hours.  DDimer No results for input(s): DDIMER in the last 168 hours.  Radiology/Studies:  Dg Chest Portable 1 View  Result Date: 05/02/2017 CLINICAL DATA:  Chest pain EXAM: PORTABLE CHEST 1 VIEW COMPARISON:  None. FINDINGS: Mild cardiomegaly  is noted but accentuated by the portable technique. The lungs are clear but hypoinflated. No bony abnormality is noted. IMPRESSION: Hypoinflation without acute abnormality. Electronically Signed   By: Inez Catalina M.D.   On: 05/02/2017 13:23    Assessment and Plan:   1. Chest pain Worrisome for ischemia. EKG unchanged. Initial troponin pending. Pain-free now. Recommend rule out for MI and then decide whether or not he needs a stress test versus cardiac cath. Would keep nothing by mouth after midnight. 2. CAD status post cath in 2011 with 70-80% diagonal otherwise nonobstructive disease normal LVEF, Lexi scan Cardiolite 08/16/15 low risk, no ischemia LVEF 55-65% 3. Hypertension 4. Hyperlipidemia   For questions or updates, please contact [facility] Please consult [email or web address] for contact info under Cardiology/STEMI.   Signed, Ermalinda Barrios, PA-C  05/02/2017 2:37 PM   The patient was seen and examined, and I agree with the history, physical exam, assessment and plan as documented above, with modifications as noted below. I have also personally reviewed all relevant documentation, old records, labs, and both radiographic and cardiovascular studies. I have also independently interpreted old and new ECG's.  71 yr old male with CAD (cath reviewed above) who presented to the ED with precordial pain which awoke him from sleep at 7:30 am. He said it was severe and radiated down his right arm, accompanied by shortness of breath and nausea.  He took his usual meds at 8 am and symptoms gradually started resolving. He did not take SL nitroglycerin because he was afraid to, as he took Viagra two days ago.  I personally reviewed the ECG which showed sinus rhythm without acute ischemic ST-T abnormalities.  Chest xray was unremarkable.  Initial troponin is normal.  Normal nuclear stress test in 08/2015 as detailed above.  He is asking to go home. His wife prefers he stays.  Recommendations: Would  admit and rule out for an acute coronary syndrome with serial troponins. If they remain normal, can adjust medications on 10/4 as necessary. Can  also determine if a stress test is warranted. If biomarkers become significantly elevated, would need coronary angiography. The patient and his wife are in agreement with this plan. For now, continue his current medication regimen.   Kate Sable, MD, [facility]  05/02/2017 3:29 PM

## 2017-05-02 NOTE — ED Notes (Signed)
Pt denies any cp or pressure at this time.

## 2017-05-02 NOTE — Telephone Encounter (Signed)
Patient c/o of active chest pain this morning around 6:00 am.  Rated 5/10 at that time.  Did not take Nitroglycerin as he stated that he had taken Viagra 2 days ago & was unsure about using the NTG.  Now c/o soreness in chest.  No dizziness or syncopal feelings.  Does have noticeable SOB since this episode.  Stated he never has had the SOB before.  BP approximately 30 minutes ago was 132/85  79.  Message sent to provider for further advice.

## 2017-05-02 NOTE — Telephone Encounter (Signed)
Pt c/o of Chest Pain: STAT if CP now or developed within 24 hours  1. Are you having CP right now? Feels tight   2. Are you experiencing any other symptoms (ex. SOB, nausea, vomiting, sweating)? Shortness of breath and nausea  3. How long have you been experiencing CP? 630 am   4. Is your CP continuous or coming and going?  Coming and going   5. Have you taken Nitroglycerin? no ?

## 2017-05-02 NOTE — ED Notes (Signed)
Called the floor concerning bed, was told that they are cleaning it and not ready at this time

## 2017-05-02 NOTE — ED Provider Notes (Signed)
[place] DEPT Provider Note   CSN: [id number] Arrival date & time: 05/02/17  1159     History   Chief Complaint Chief Complaint  Patient presents with  . Chest Pain    HPI David Combs is a 70 y.o. male.Patient developed anterior chest pain with numbness in his right arm onset 6 AM today feels like "heart attack" he's had in the past. No treatment prior to coming here. Associated symptoms include shortness of breath and sweatiness. Discomfort is presently mild and nonradiating described as a cramp in his anterior chest. Nothing made symptoms better or worse. No other associated symptoms  HPI  Past Medical History:  Diagnosis Date  . Coronary atherosclerosis of native coronary artery    70-80% small diagonal - medically managed, LVEF 55%  . Dermatophytosis of nail   . ED (erectile dysfunction)   . Essential hypertension, benign   . GERD (gastroesophageal reflux disease)   . GERD (gastroesophageal reflux disease)   . Lumbago   . Mixed hyperlipidemia   . Non-ST elevation myocardial infarction (NSTEMI) ([place])    11/11  . Obesity   . Prostatitis, unspecified     Patient Active Problem List   Diagnosis Date Noted  . Coronary atherosclerosis of native coronary artery   . Essential hypertension, benign   . Hyperlipemia   . Obesity     Past Surgical History:  Procedure Laterality Date  . APPENDECTOMY    . CERVICAL SPINE SURGERY    . CHOLECYSTECTOMY     05/2014. rt upper quadrant pain   . ESOPHAGOGASTRODUODENOSCOPY N/A 12/02/2014   Procedure: ESOPHAGOGASTRODUODENOSCOPY (EGD);  Surgeon: Rogene Houston, MD;  Location: [facility];  Service: Endoscopy;  Laterality: N/A;  200  . Left wrist fracture surgery  1994  . Right knee surgery    . TONSILLECTOMY  AGE 77       Home Medications    Prior to Admission medications   Medication Sig Start Date End Date Taking? Authorizing Provider  amLODipine (NORVASC) 10 MG tablet Take 5 mg by mouth daily.   Yes [provider]  aspirin EC 81 MG tablet Take 81 mg by mouth daily.     Yes [provider]  Cholecalciferol (VITAMIN D3) 1000 units CAPS Take 1 capsule by mouth 2 (two) times daily.   Yes [provider]  acetaminophen (TYLENOL) 325 MG tablet Take 650 mg by mouth every 6 (six) hours as needed.      [provider]  DULoxetine (CYMBALTA) 60 MG capsule Take 60 mg by mouth daily.    [provider]  gabapentin (NEURONTIN) 300 MG capsule Take 300 mg by mouth daily.    [provider]  loratadine (CLARITIN) 10 MG tablet Take 10 mg by mouth daily.    [provider]  nitroGLYCERIN (NITROSTAT) 0.4 MG SL tablet Place 1 tablet (0.4 mg total) under the tongue every 5 (five) minutes as needed for chest pain. 08/06/15   Satira Sark, MD  omeprazole (PRILOSEC) 20 MG capsule Take 20 mg by mouth 2 (two) times daily.     [provider]  rosuvastatin (CRESTOR) 5 MG tablet Take 5 mg by mouth every other day.    [provider]  sildenafil (VIAGRA) 50 MG tablet Take 50 mg by mouth as needed.     [provider]  tamsulosin (FLOMAX) 0.4 MG CAPS capsule Take 0.4 mg by mouth daily.    [provider]  UNKNOWN TO PATIENT  GLUCOSAMINE TWICE DAILY    [provider]    Family History No family history on file.  Social History Social History  Substance Use Topics  . Smoking status: Former Smoker    Packs/day: 1.00    Years: 40.00    Types: Cigarettes    Quit date: 05/31/2002  . Smokeless tobacco: Never Used  . Alcohol use No     Allergies   Iodinated diagnostic agents and Sulfa drugs cross reactors   Review of Systems Review of Systems  Constitutional: Positive for diaphoresis.  HENT: Negative.   Respiratory: Positive for shortness of breath.   Cardiovascular: Positive for chest pain.  Gastrointestinal: Negative.   Musculoskeletal: Negative.   Skin: Negative.   Neurological: Negative.     Psychiatric/Behavioral: Negative.   All other systems reviewed and are negative.    Physical Exam Updated Vital Signs BP 111/63   Pulse 68   Temp 98.2 F (36.8 C) (Oral)   Resp 16   Ht 5\' 8"  (1.727 m)   Wt 104.3 kg (230 lb)   BMI 34.97 kg/m   Physical Exam  Constitutional: He appears well-developed and well-nourished.  HENT:  Head: Normocephalic and atraumatic.  Eyes: Pupils are equal, round, and reactive to light. Conjunctivae are normal.  Neck: Neck supple. No tracheal deviation present. No thyromegaly present.  Cardiovascular: Normal rate and regular rhythm.   No murmur heard. Pulmonary/Chest: Effort normal and breath sounds normal.  Abdominal: Soft. Bowel sounds are normal. He exhibits no distension. There is no tenderness.  Musculoskeletal: Normal range of motion. He exhibits no edema or tenderness.  Neurological: He is alert. Coordination normal.  Skin: Skin is warm and dry. No rash noted.  Psychiatric: He has a normal mood and affect.  Nursing note and vitals reviewed.    ED Treatments / Results  Labs (all labs ordered are listed, but only abnormal results are displayed) Labs Reviewed  CBC - Abnormal; Notable for the following:       Result Value   Platelets 126 (*)    All other components within normal limits  BASIC METABOLIC PANEL  TROPONIN I    EKG  EKG Interpretation  Date/Time:  Wednesday May 02 2017 12:06:30 EDT Ventricular Rate:  70 PR Interval:  156 QRS Duration: 82 QT Interval:  374 QTC Calculation: 403 R Axis:   56 Text Interpretation:  Normal sinus rhythm Normal ECG No significant change since last tracing Confirmed by Orlie Dakin [id number]) on 05/02/2017 12:18:21 PM       Radiology No results found.  Procedures Procedures (including critical care time)  Medications Ordered in ED Medications  nitroGLYCERIN (NITROSTAT) SL tablet 0.4 mg (not administered)  aspirin chewable tablet 243 mg (not administered)    Chest x-ray  viewed by me Results for orders placed or performed during the hospital encounter of 62/13/08  Basic metabolic panel  Result Value Ref Range   Sodium 138 135 - 145 mmol/L   Potassium 4.1 3.5 - 5.1 mmol/L   Chloride 103 101 - 111 mmol/L   CO2 26 22 - 32 mmol/L   Glucose, Bld 83 65 - 99 mg/dL   BUN 17 6 - 20 mg/dL   Creatinine, Ser 0.89 0.61 - 1.24 mg/dL   Calcium 9.2 8.9 - 10.3 mg/dL   GFR calc non Af Amer >60 >60 mL/min   GFR calc Af Amer >60 >60 mL/min   Anion gap 9 5 - 15  CBC  Result Value Ref Range  WBC 4.7 4.0 - 10.5 K/uL   RBC 4.40 4.22 - 5.81 MIL/uL   Hemoglobin 14.6 13.0 - 17.0 g/dL   HCT 43.1 39.0 - 52.0 %   MCV 98.0 78.0 - 100.0 fL   MCH 33.2 26.0 - 34.0 pg   MCHC 33.9 30.0 - 36.0 g/dL   RDW 12.6 11.5 - 15.5 %   Platelets 126 (L) 150 - 400 K/uL  Troponin I  Result Value Ref Range   Troponin I <0.03 <0.03 ng/mL   Dg Chest Portable 1 View  Result Date: 05/02/2017 CLINICAL DATA:  Chest pain EXAM: PORTABLE CHEST 1 VIEW COMPARISON:  None. FINDINGS: Mild cardiomegaly is noted but accentuated by the portable technique. The lungs are clear but hypoinflated. No bony abnormality is noted. IMPRESSION: Hypoinflation without acute abnormality. Electronically Signed   By: Inez Catalina M.D.   On: 05/02/2017 13:23   Initial Impression / Assessment and Plan / ED Course  I have reviewed the triage vital signs and the nursing notes.  Pertinent labs & imaging results that were available during my care of the patient were reviewed by me and considered in my medical decision making (see chart for details).     Dr.Koneswaren from cardiology service consulted by me and will see patient in the hospital 1:15 PM patient is asymptomatic and pain-free after treatment with aspirin. He did not receive any nitroglycerin. At 3:10 PM he remains asymptomatic Consulted Dr.Memon will arrange for overnight stay Final Clinical Impressions(s) / ED Diagnoses  Diagnosis unstable angina Final  diagnoses:  None    New Prescriptions New Prescriptions   No medications on file     Orlie Dakin, MD 05/02/17 1515

## 2017-05-02 NOTE — Telephone Encounter (Signed)
If he is having active chest pain and worsening shortness of breath, he should be seen in the ER for further assessment to make sure that this does not represent ACS.

## 2017-05-02 NOTE — Telephone Encounter (Signed)
Patient and wife notified. Wife is taking him to [facility].

## 2017-05-03 DIAGNOSIS — I2510 Atherosclerotic heart disease of native coronary artery without angina pectoris: Secondary | ICD-10-CM

## 2017-05-03 DIAGNOSIS — E782 Mixed hyperlipidemia: Secondary | ICD-10-CM

## 2017-05-03 DIAGNOSIS — I200 Unstable angina: Secondary | ICD-10-CM | POA: Diagnosis not present

## 2017-05-03 DIAGNOSIS — I10 Essential (primary) hypertension: Secondary | ICD-10-CM | POA: Diagnosis not present

## 2017-05-03 DIAGNOSIS — R079 Chest pain, unspecified: Secondary | ICD-10-CM | POA: Diagnosis not present

## 2017-05-03 DIAGNOSIS — I25119 Atherosclerotic heart disease of native coronary artery with unspecified angina pectoris: Secondary | ICD-10-CM | POA: Diagnosis not present

## 2017-05-03 LAB — TROPONIN I: Troponin I: <0.03 ng/mL (ref <0.03)

## 2017-05-03 MED ORDER — AMLODIPINE BESYLATE 5 MG PO TABS: 7.5000 mg | ORAL_TABLET | Freq: Every day | ORAL | Status: DC

## 2017-05-03 MED ORDER — AMLODIPINE BESYLATE 5 MG PO TABS: 7.5000 mg | ORAL_TABLET | Freq: Every day | ORAL | 1 refills | Status: DC

## 2017-05-03 NOTE — Progress Notes (Signed)
Patient is to be discharged home and in stable condition. IV and telemetry removed. Patient given discharge instructions and verbalized understanding. Patient denies the need for a wheelchair escort out and was accompanied out by staff, ambulating.  Celestia Khat, RN

## 2017-05-03 NOTE — Discharge Summary (Signed)
Physician Discharge Summary  David Combs XBJ:[id number] DOB: October 18, 1945 DOA: 05/02/2017  PCP: Glenda Chroman, MD  Admit date: 05/02/2017 Discharge date: 05/03/2017  Admitted From: home Disposition:  home  Recommendations for Outpatient Follow-up:  1. Follow up with PCP in 1-2 weeks 2. Please obtain BMP/CBC in one week 3. Follow up with cardiology as an outpatient to consider further work up  [place]: Equipment/Devices:   Discharge Condition: stable CODE STATUS: full code Diet recommendation: Heart Healthy   Brief/Interim Summary: 71 y/o male with history of CAD, admitted to the hospital with complaints of chest pain. Patient ruled out for ACS with negative cardiac markers and no acute changes on EKG. He did not have any recurrence of symptoms since he was admitted. He was seen by cardiology who recommended increasing norvasc to 7.5mg  daily and that they would follow him up as an outpatient to discuss further work up. Patient is feeling well and wants to go home. The remainder of his medical problems remained stable.  Discharge Diagnoses:  Active Problems:   Coronary atherosclerosis of native coronary artery   Essential hypertension, benign   Hyperlipemia   Chest pain    Discharge Instructions  Discharge Instructions    Diet - low sodium heart healthy    Complete by:  As directed    Increase activity slowly    Complete by:  As directed      Allergies as of 05/03/2017      Reactions   Iodinated Diagnostic Agents Swelling   Sulfa Drugs Cross Reactors Itching      Medication List    STOP taking these medications   amoxicillin-clavulanate 500-125 MG tablet Commonly known as:  AUGMENTIN   predniSONE 10 MG tablet Commonly known as:  DELTASONE     TAKE these medications   amLODipine 5 MG tablet Commonly known as:  NORVASC Take 1.5 tablets (7.5 mg total) by mouth daily. What changed:  medication strength  how much to take   aspirin EC 81 MG tablet Take 81  mg by mouth daily.   DULoxetine 60 MG capsule Commonly known as:  CYMBALTA Take 60 mg by mouth daily.   FLUZONE HIGH-DOSE 0.5 ML injection Generic drug:  Influenza vac split quadrivalent PF Inject 0.5 mLs into the muscle once.   loratadine 10 MG tablet Commonly known as:  CLARITIN Take 10 mg by mouth daily.   meloxicam 15 MG tablet Commonly known as:  MOBIC Take 15 mg by mouth daily.   nitroGLYCERIN 0.4 MG SL tablet Commonly known as:  NITROSTAT Place 1 tablet (0.4 mg total) under the tongue every 5 (five) minutes as needed for chest pain.   omeprazole 20 MG capsule Commonly known as:  PRILOSEC Take 20 mg by mouth 2 (two) times daily.   rosuvastatin 5 MG tablet Commonly known as:  CRESTOR Take 5 mg by mouth every other day.   sildenafil 50 MG tablet Commonly known as:  VIAGRA Take 50 mg by mouth as needed.   tamsulosin 0.4 MG Caps capsule Commonly known as:  FLOMAX Take 0.4 mg by mouth daily.   TYLENOL 325 MG tablet Generic drug:  acetaminophen Take 650 mg by mouth every 6 (six) hours as needed.   UNKNOWN TO PATIENT GLUCOSAMINE TWICE DAILY   Vitamin D3 1000 units Caps Take 1 capsule by mouth 2 (two) times daily.       Allergies  Allergen Reactions  . Iodinated Diagnostic Agents Swelling  . Sulfa Drugs Cross Reactors Itching  Consultations:  cardiology   Procedures/Studies: Dg Chest Portable 1 View  Result Date: 05/02/2017 CLINICAL DATA:  Chest pain EXAM: PORTABLE CHEST 1 VIEW COMPARISON:  None. FINDINGS: Mild cardiomegaly is noted but accentuated by the portable technique. The lungs are clear but hypoinflated. No bony abnormality is noted. IMPRESSION: Hypoinflation without acute abnormality. Electronically Signed   By: Inez Catalina M.D.   On: 05/02/2017 13:23       Subjective: No further  Chest pain. No shortness of breath  Discharge Exam: Vitals:   05/02/17 2011 05/03/17 0600  BP: 133/74 (!) 154/77  Pulse: 65   Resp: 18 16  Temp: 98  F (36.7 C) 98 F (36.7 C)  SpO2: 95% 98%   Vitals:   05/02/17 1630 05/02/17 1750 05/02/17 2011 05/03/17 0600  BP: 111/61 107/80 133/74 (!) 154/77  Pulse: 65 63 65   Resp: 16 18 18 16   Temp:  98.1 F (36.7 C) 98 F (36.7 C) 98 F (36.7 C)  TempSrc:  Oral Oral   SpO2: 98% 97% 95% 98%  Weight:  104.3 kg (230 lb)    Height:  5\' 8"  (1.727 m)      General: Pt is alert, awake, not in acute distress Cardiovascular: RRR, S1/S2 +, no rubs, no gallops Respiratory: CTA bilaterally, no wheezing, no rhonchi Abdominal: Soft, NT, ND, bowel sounds + Extremities: no edema, no cyanosis    The results of significant diagnostics from this hospitalization (including imaging, microbiology, ancillary and laboratory) are listed below for reference.     Microbiology: No results found for this or any previous visit (from the past 240 hour(s)).   Labs: BNP (last 3 results) No results for input(s): BNP in the last 8760 hours. Basic Metabolic Panel:  Recent Labs Lab 05/02/17 1232  NA 138  K 4.1  CL 103  CO2 26  GLUCOSE 83  BUN 17  CREATININE 0.89  CALCIUM 9.2   Liver Function Tests: No results for input(s): AST, ALT, ALKPHOS, BILITOT, PROT, ALBUMIN in the last 168 hours. No results for input(s): LIPASE, AMYLASE in the last 168 hours. No results for input(s): AMMONIA in the last 168 hours. CBC:  Recent Labs Lab 05/02/17 1232  WBC 4.7  HGB 14.6  HCT 43.1  MCV 98.0  PLT 126*   Cardiac Enzymes:  Recent Labs Lab 05/02/17 1232 05/02/17 2004 05/02/17 2311 05/03/17 0153  TROPONINI <0.03 <0.03 <0.03 <0.03   BNP: Invalid input(s): POCBNP CBG: No results for input(s): GLUCAP in the last 168 hours. D-Dimer No results for input(s): DDIMER in the last 72 hours. Hgb A1c No results for input(s): HGBA1C in the last 72 hours. Lipid Profile No results for input(s): CHOL, HDL, LDLCALC, TRIG, CHOLHDL, LDLDIRECT in the last 72 hours. Thyroid function studies No results for  input(s): TSH, T4TOTAL, T3FREE, THYROIDAB in the last 72 hours.  Invalid input(s): FREET3 Anemia work up No results for input(s): VITAMINB12, FOLATE, FERRITIN, TIBC, IRON, RETICCTPCT in the last 72 hours. Urinalysis No results found for: COLORURINE, APPEARANCEUR, LABSPEC, [place], GLUCOSEU, HGBUR, BILIRUBINUR, KETONESUR, PROTEINUR, UROBILINOGEN, NITRITE, LEUKOCYTESUR Sepsis Labs Invalid input(s): PROCALCITONIN,  WBC,  LACTICIDVEN Microbiology No results found for this or any previous visit (from the past 240 hour(s)).   Time coordinating discharge: Over 30 minutes  SIGNED:   Kathie Dike, MD  [facility] 05/03/2017, 11:01 AM Pager   If 7PM-7AM, please contact night-coverage [email or web address] Password [email or web address]

## 2017-05-03 NOTE — Progress Notes (Signed)
Progress Note  Patient Name: David Combs Date of Encounter: 05/03/2017  Primary Cardiologist: Dr. Satira Sark  Subjective   No chest pain or shortness of breath. Wants to go home today.  Inpatient Medications    Scheduled Meds: . amLODipine  5 mg Oral Daily  . aspirin EC  81 mg Oral Daily  . cholecalciferol  1,000 Units Oral BID  . DULoxetine  60 mg Oral Daily  . enoxaparin (LOVENOX) injection  40 mg Subcutaneous Q24H  . loratadine  10 mg Oral Daily  . pantoprazole  40 mg Oral BID AC  . rosuvastatin  5 mg Oral QODAY  . tamsulosin  0.4 mg Oral Daily    PRN Meds: acetaminophen, nitroGLYCERIN, ondansetron (ZOFRAN) IV   Vital Signs    Vitals:   05/02/17 1630 05/02/17 1750 05/02/17 2011 05/03/17 0600  BP: 111/61 107/80 133/74 (!) 154/77  Pulse: 65 63 65   Resp: 16 18 18 16   Temp:  98.1 F (36.7 C) 98 F (36.7 C) 98 F (36.7 C)  TempSrc:  Oral Oral   SpO2: 98% 97% 95% 98%  Weight:  230 lb (104.3 kg)    Height:  5\' 8"  (1.727 m)      Intake/Output Summary (Last 24 hours) at 05/03/17 1033 Last data filed at 05/03/17 0900  Gross per 24 hour  Intake              360 ml  Output             1150 ml  Net             -790 ml   Filed Weights   05/02/17 1206 05/02/17 1750  Weight: 230 lb (104.3 kg) 230 lb (104.3 kg)    Telemetry    Sinus rhythm. Personally reviewed.  ECG     Tracing from 05/02/2017 shows sinus bradycardia. No acute ST segment changes. Personally reviewed.  Physical Exam   GEN: Obese male. No acute distress.   Neck: No JVD. Cardiac: RRR, no gallop.  Respiratory: Nonlabored. Clear to auscultation bilaterally. GI: Obese, nontender, bowel sounds present. MS: No edema; No deformity. Neuro:  Nonfocal. Psych: Alert and oriented x 3. Flat affect.  Labs    Chemistry Recent Labs Lab 05/02/17 1232  NA 138  K 4.1  CL 103  CO2 26  GLUCOSE 83  BUN 17  CREATININE 0.89  CALCIUM 9.2  GFRNONAA >60  GFRAA >60  ANIONGAP 9      Hematology Recent Labs Lab 05/02/17 1232  WBC 4.7  RBC 4.40  HGB 14.6  HCT 43.1  MCV 98.0  MCH 33.2  MCHC 33.9  RDW 12.6  PLT 126*    Cardiac Enzymes Recent Labs Lab 05/02/17 1232 05/02/17 2004 05/02/17 2311 05/03/17 0153  TROPONINI <0.03 <0.03 <0.03 <0.03   No results for input(s): TROPIPOC in the last 168 hours.   Radiology    Dg Chest Portable 1 View  Result Date: 05/02/2017 CLINICAL DATA:  Chest pain EXAM: PORTABLE CHEST 1 VIEW COMPARISON:  None. FINDINGS: Mild cardiomegaly is noted but accentuated by the portable technique. The lungs are clear but hypoinflated. No bony abnormality is noted. IMPRESSION: Hypoinflation without acute abnormality. Electronically Signed   By: Inez Catalina M.D.   On: 05/02/2017 13:23    Cardiac Studies   Lexiscan Cardiolite 08/16/2015:  There was no ST segment deviation noted during stress.  The study is normal.  This is a low risk study.  The  left ventricular ejection fraction is normal (55-65%).  Patient Profile     71 y.o. male with a history of hyperlipidemia, hypertension, and CAD involving a small diagonal branch that has been managed medically. He presents with chest pain has ruled out for ACS by cardiac enzymes, no acute ST segment changes by ECG.  Assessment & Plan    1. Chest pain suspicious for recurrent angina, although with normal cardiac markers and no acute ST segment changes by ECG. He is clinically stable today and will like to go home.  2. History of branch vessel CAD, 70-80% small diagonal stenosis and has been managed medically. Cardiolite study from January 2017 was low risk without active ischemic territories.  3. Essential hypertension, systolic blood pressure 757V.  4. OSA on CPAP.  Discussed the situation with patient and wife. He would like to go home. Will cancel nothing by mouth status. Office follow-up being arranged within the next 2 weeks and we can discuss whether any additional ischemic  testing is needed. In the meanwhile continue aspirin, increase Norvasc to 7.5 mg daily, and continue Crestor. He has as needed nitroglycerin available as well. Would not add Imdur since he does use Viagra regularly.  Signed, Rozann Lesches, MD  05/03/2017, 10:33 AM

## 2017-05-03 NOTE — Care Management Obs Status (Signed)
[facility] NOTIFICATION   Patient Details  Name: KARINA LENDERMAN MRN: [id number] Date of Birth: Aug 23, 1945   Medicare Observation Status Notification Given:  Yes    Sherald Barge, RN 05/03/2017, 9:42 AM

## 2017-05-11 DIAGNOSIS — R5383 Other fatigue: Secondary | ICD-10-CM | POA: Diagnosis not present

## 2017-05-11 DIAGNOSIS — I10 Essential (primary) hypertension: Secondary | ICD-10-CM | POA: Diagnosis not present

## 2017-05-11 DIAGNOSIS — F4310 Post-traumatic stress disorder, unspecified: Secondary | ICD-10-CM | POA: Diagnosis not present

## 2017-05-11 DIAGNOSIS — Z79899 Other long term (current) drug therapy: Secondary | ICD-10-CM | POA: Diagnosis not present

## 2017-05-11 DIAGNOSIS — Z299 Encounter for prophylactic measures, unspecified: Secondary | ICD-10-CM | POA: Diagnosis not present

## 2017-05-11 DIAGNOSIS — R079 Chest pain, unspecified: Secondary | ICD-10-CM | POA: Diagnosis not present

## 2017-05-11 DIAGNOSIS — G4730 Sleep apnea, unspecified: Secondary | ICD-10-CM | POA: Diagnosis not present

## 2017-05-11 DIAGNOSIS — I2510 Atherosclerotic heart disease of native coronary artery without angina pectoris: Secondary | ICD-10-CM | POA: Diagnosis not present

## 2017-05-11 DIAGNOSIS — Z6836 Body mass index (BMI) 36.0-36.9, adult: Secondary | ICD-10-CM | POA: Diagnosis not present

## 2017-05-21 ENCOUNTER — Ambulatory Visit: Payer: MEDICARE | Admitting: Cardiology

## 2017-06-05 ENCOUNTER — Ambulatory Visit: Payer: MEDICARE | Admitting: Cardiology

## 2017-06-14 NOTE — Progress Notes (Signed)
Cardiology Office Note  Date: 06/15/2017   ID: STEPEN PRINS, DOB [birth year], MRN [id number]  PCP: Glenda Chroman, MD  Primary Cardiologist: Rozann Lesches, MD   Chief Complaint  Patient presents with  . Coronary Artery Disease    History of Present Illness: David Combs is a 71 y.o. male last seen in the office back in August.  In the interim he was seen as an inpatient consult in October with chest discomfort consistent with angina.  He ruled out for ACS and medical therapy was pursued.  Norvasc was increased and he was otherwise continued on baseline medical regimen with outpatient follow-up arranged.  He presents today stating that he took the increased dose of Norvasc but felt like it was making his chest pain worse.  Since then he cut the dose back to 5 mg daily and feels well.  We reviewed his current medications and made no changes other than refilling Norvasc at 5 mg daily.  Myoview from January 2017 was low risk.  We will plan to continue with observation.  Past Medical History:  Diagnosis Date  . Coronary atherosclerosis of native coronary artery    70-80% small diagonal - medically managed, LVEF 55%  . Dermatophytosis of nail   . ED (erectile dysfunction)   . Essential hypertension, benign   . GERD (gastroesophageal reflux disease)   . GERD (gastroesophageal reflux disease)   . Lumbago   . Mixed hyperlipidemia   . Non-ST elevation myocardial infarction (NSTEMI) ([place])    11/11  . Obesity   . Prostatitis, unspecified     Past Surgical History:  Procedure Laterality Date  . APPENDECTOMY    . CERVICAL SPINE SURGERY    . CHOLECYSTECTOMY     05/2014. rt upper quadrant pain   . ESOPHAGOGASTRODUODENOSCOPY (EGD) N/A 12/02/2014   Performed by Rogene Houston, MD at [place]  . Left wrist fracture surgery  1994  . Right knee surgery    . TONSILLECTOMY  AGE 19    Current Outpatient Medications  Medication Sig Dispense Refill  . acetaminophen (TYLENOL)  325 MG tablet Take 650 mg by mouth every 6 (six) hours as needed.      Marland Kitchen aspirin EC 81 MG tablet Take 81 mg by mouth daily.      . Cholecalciferol (VITAMIN D3) 1000 units CAPS Take 1 capsule by mouth 2 (two) times daily.    . DULoxetine (CYMBALTA) 60 MG capsule Take 60 mg by mouth daily.    . Influenza vac split quadrivalent PF (FLUZONE HIGH-DOSE) 0.5 ML injection Inject 0.5 mLs into the muscle once.    . loratadine (CLARITIN) 10 MG tablet Take 10 mg by mouth daily.    . meloxicam (MOBIC) 15 MG tablet Take 15 mg by mouth daily.  5  . nitroGLYCERIN (NITROSTAT) 0.4 MG SL tablet Place 1 tablet (0.4 mg total) under the tongue every 5 (five) minutes as needed for chest pain. 75 tablet 3  . omeprazole (PRILOSEC) 20 MG capsule Take 20 mg by mouth 2 (two) times daily.     . rosuvastatin (CRESTOR) 5 MG tablet Take 5 mg by mouth every other day.    . sildenafil (VIAGRA) 50 MG tablet Take 50 mg by mouth as needed.     . tamsulosin (FLOMAX) 0.4 MG CAPS capsule Take 0.4 mg by mouth daily.    Marland Kitchen UNKNOWN TO PATIENT GLUCOSAMINE TWICE DAILY    . amLODipine (NORVASC) 5 MG tablet Take 1.5  tablets (7.5 mg total) daily by mouth. 135 tablet 1   No current facility-administered medications for this visit.    Allergies:  Iodinated diagnostic agents and Sulfa drugs cross reactors   Social History: The patient  reports that he quit smoking about 15 years ago. His smoking use included cigarettes. He has a 40.00 pack-year smoking history. he has never used smokeless tobacco. He reports that he does not drink alcohol or use drugs.   ROS:  Please see the history of present illness. Otherwise, complete review of systems is positive for anxiety.  All other systems are reviewed and negative.   Physical Exam: VS:  BP 122/84   Pulse 78   Ht 5\' 7"  (1.702 m)   Wt 236 lb (107 kg)   SpO2 97%   BMI 36.96 kg/m , BMI Body mass index is 36.96 kg/m.  Wt Readings from Last 3 Encounters:  06/15/17 236 lb (107 kg)  05/02/17 230  lb (104.3 kg)  03/20/17 237 lb (107.5 kg)    General: Obese male, appears comfortable at rest. HEENT: Conjunctiva and lids normal, oropharynx clear. Neck: Supple, no elevated JVP or carotid bruits, no thyromegaly. Lungs: Clear to auscultation, nonlabored breathing at rest. Cardiac: Regular rate and rhythm, no S3 or significant systolic murmur, no pericardial rub. Abdomen: Soft, nontender, bowel sounds present, no guarding or rebound. Extremities: No pitting edema, distal pulses 2+.  ECG: I personally reviewed the tracing from 05/02/2017 which showed normal sinus rhythm.  Recent Labwork: 05/02/2017: BUN 17; Creatinine, Ser 0.89; Hemoglobin 14.6; Platelets 126; Potassium 4.1; Sodium 138   Other Studies Reviewed Today:  Lexiscan Myoview 08/16/2015:  There was no ST segment deviation noted during stress.  The study is normal.  This is a low risk study.  The left ventricular ejection fraction is normal (55-65%).  Assessment and Plan:  1.  Branch vessel CAD based on previous workup with low risk Myoview study in January 2017.  He is symptomatically stable at this time and we will plan to continue with current regimen, reducing Norvasc back to 5 mg daily as discussed above.  2.  Hyperlipidemia, continues on Crestor.  Lab work followed through [employer] [facility] system and with Dr. Woody Seller.  3.  Essential hypertension, blood pressure is well controlled today.  Current medicines were reviewed with the patient today.  Disposition: Follow-up in 6 months.  Signed, Satira Sark, MD, [facility] 06/15/2017 11:58 AM    [facility] [place], [place], [place] [address] Phone: [phone]; Fax: [phone]

## 2017-06-15 ENCOUNTER — Encounter: Payer: Self-pay | Admitting: Cardiology

## 2017-06-15 ENCOUNTER — Ambulatory Visit (INDEPENDENT_AMBULATORY_CARE_PROVIDER_SITE_OTHER): Payer: MEDICARE | Admitting: Cardiology

## 2017-06-15 VITALS — BP 122/84 | HR 78 | Ht 67.0 in | Wt 236.0 lb

## 2017-06-15 DIAGNOSIS — I200 Unstable angina: Secondary | ICD-10-CM

## 2017-06-15 DIAGNOSIS — E782 Mixed hyperlipidemia: Secondary | ICD-10-CM | POA: Diagnosis not present

## 2017-06-15 DIAGNOSIS — I10 Essential (primary) hypertension: Secondary | ICD-10-CM

## 2017-06-15 DIAGNOSIS — I209 Angina pectoris, unspecified: Secondary | ICD-10-CM

## 2017-06-15 DIAGNOSIS — I25119 Atherosclerotic heart disease of native coronary artery with unspecified angina pectoris: Secondary | ICD-10-CM | POA: Diagnosis not present

## 2017-06-15 MED ORDER — AMLODIPINE BESYLATE 5 MG PO TABS: 7.5000 mg | ORAL_TABLET | Freq: Every day | ORAL | 1 refills | Status: DC

## 2017-06-15 NOTE — Patient Instructions (Addendum)
Your physician wants you to follow-up in: [facility] [place] will receive a reminder letter in the mail two months in advance. If you don't receive a letter, please call our office to schedule the follow-up appointment.  Your physician has recommended you make the following change in your medication:   CHANGE AMLODIPINE 5 MG DAILY  Thank you for choosing [facility]!!

## 2017-07-27 ENCOUNTER — Encounter (INDEPENDENT_AMBULATORY_CARE_PROVIDER_SITE_OTHER): Payer: Self-pay | Admitting: *Deleted

## 2017-08-20 DIAGNOSIS — K5900 Constipation, unspecified: Secondary | ICD-10-CM | POA: Diagnosis not present

## 2017-08-20 DIAGNOSIS — I10 Essential (primary) hypertension: Secondary | ICD-10-CM | POA: Diagnosis not present

## 2017-08-20 DIAGNOSIS — Z299 Encounter for prophylactic measures, unspecified: Secondary | ICD-10-CM | POA: Diagnosis not present

## 2017-08-20 DIAGNOSIS — Z6836 Body mass index (BMI) 36.0-36.9, adult: Secondary | ICD-10-CM | POA: Diagnosis not present

## 2017-08-20 DIAGNOSIS — G20 Parkinson's disease: Secondary | ICD-10-CM | POA: Diagnosis not present

## 2017-10-08 ENCOUNTER — Ambulatory Visit (INDEPENDENT_AMBULATORY_CARE_PROVIDER_SITE_OTHER): Payer: MEDICARE | Admitting: Internal Medicine

## 2017-10-08 ENCOUNTER — Encounter (INDEPENDENT_AMBULATORY_CARE_PROVIDER_SITE_OTHER): Payer: Self-pay | Admitting: Internal Medicine

## 2017-10-08 VITALS — BP 140/82 | HR 64 | Temp 98.1°F | Ht 67.0 in | Wt 238.1 lb

## 2017-10-08 DIAGNOSIS — Z860100 Personal history of colon polyps, unspecified: Secondary | ICD-10-CM

## 2017-10-08 HISTORY — DX: Personal history of colon polyps, unspecified: Z86.0100

## 2017-10-08 HISTORY — DX: Personal history of colonic polyps: Z86.010

## 2017-10-08 NOTE — Progress Notes (Signed)
Subjective:    Patient ID: David Combs, male    DOB: [birth year], 72 y.o.   MRN: [id number]  HPI Here today with c/o rectal growth. States he has a skin tag on his anus.  He says he is due for a colonoscopy.  His BMs are normal. No changes in his stool. He does take a stool softener for constipation. His appetite is good. No weight loss. No family hx of colon cancer.   Had a rectal in [place] and stool was negative.  His last colonoscopy was in  January of 2014 by Dr. Britta Mccreedy (Person hx of colon polyps).  Sessile polyp found in rectum. Biopsy: inflammatory polyp   Review of Systems  Past Medical History:  Diagnosis Date  . Coronary atherosclerosis of native coronary artery    70-80% small diagonal - medically managed, LVEF 55%  . Dermatophytosis of nail   . ED (erectile dysfunction)   . Essential hypertension, benign   . GERD (gastroesophageal reflux disease)   . GERD (gastroesophageal reflux disease)   . Lumbago   . Mixed hyperlipidemia   . Non-ST elevation myocardial infarction (NSTEMI) ([place])    11/11  . Obesity   . Prostatitis, unspecified     Past Surgical History:  Procedure Laterality Date  . APPENDECTOMY    . CERVICAL SPINE SURGERY    . CHOLECYSTECTOMY     05/2014. rt upper quadrant pain   . ESOPHAGOGASTRODUODENOSCOPY N/A 12/02/2014   Procedure: ESOPHAGOGASTRODUODENOSCOPY (EGD);  Surgeon: Rogene Houston, MD;  Location: [facility];  Service: Endoscopy;  Laterality: N/A;  200  . Left wrist fracture surgery  1994  . Right knee surgery    . TONSILLECTOMY  AGE 62    Allergies  Allergen Reactions  . Iodinated Diagnostic Agents Swelling  . Sulfa Drugs Cross Reactors Itching    Current Outpatient Medications on File Prior to Visit  Medication Sig Dispense Refill  . acetaminophen (TYLENOL) 325 MG tablet Take 650 mg by mouth every 6 (six) hours as needed.      Marland Kitchen amLODipine (NORVASC) 5 MG tablet Take 5 mg by mouth daily.    Marland Kitchen aspirin EC 81 MG tablet Take 81  mg by mouth daily.      . carbidopa-levodopa (SINEMET IR) 10-100 MG tablet Take 1 tablet by mouth 3 (three) times daily.    . carboxymethylcellulose (REFRESH PLUS) 0.5 % SOLN 1 drop 3 (three) times daily as needed.    . Cholecalciferol (VITAMIN D3) 1000 units CAPS Take 1 capsule by mouth 2 (two) times daily.    Marland Kitchen docusate sodium (COLACE) 100 MG capsule Take 100 mg by mouth daily.    . DULoxetine (CYMBALTA) 60 MG capsule Take 60 mg by mouth daily.    Marland Kitchen glucosamine-chondroitin 500-400 MG tablet Take 1 tablet by mouth 3 (three) times daily.    Marland Kitchen loratadine (CLARITIN) 10 MG tablet Take 10 mg by mouth daily.    . meloxicam (MOBIC) 15 MG tablet Take 15 mg by mouth daily.  5  . memantine (NAMENDA) 5 MG tablet Take 5 mg by mouth 2 (two) times daily.    . nitroGLYCERIN (NITROSTAT) 0.4 MG SL tablet Place 1 tablet (0.4 mg total) under the tongue every 5 (five) minutes as needed for chest pain. 75 tablet 3  . omeprazole (PRILOSEC) 20 MG capsule Take 20 mg by mouth 2 (two) times daily.     . rosuvastatin (CRESTOR) 5 MG tablet Take 5 mg by mouth every  other day.    . sildenafil (VIAGRA) 50 MG tablet Take 50 mg by mouth as needed.     . tamsulosin (FLOMAX) 0.4 MG CAPS capsule Take 0.4 mg by mouth daily.    . traZODone (DESYREL) 100 MG tablet Take 100 mg by mouth at bedtime.    Marland Kitchen UNKNOWN TO PATIENT GLUCOSAMINE TWICE DAILY    . amLODipine (NORVASC) 5 MG tablet Take 1.5 tablets (7.5 mg total) daily by mouth. 135 tablet 1  . Influenza vac split quadrivalent PF (FLUZONE HIGH-DOSE) 0.5 ML injection Inject 0.5 mLs into the muscle once.     No current facility-administered medications on file prior to visit.         Objective:   Physical Exam Blood pressure 140/82, pulse 64, temperature 98.1 F (36.7 C), height 5\' 7"  (1.702 m), weight 238 lb 1.6 oz (108 kg). Alert and oriented. Skin warm and dry. Oral mucosa is moist.   . Sclera anicteric, conjunctivae is pink. Thyroid not enlarged. No cervical lymphadenopathy.  Lungs clear. Heart regular rate and rhythm.  Abdomen is soft. Bowel sounds are positive. No hepatomegaly. No abdominal masses felt. No tenderness.  No edema to lower extremities.   Skin tag noted to rectum (external). (purplish in color).        Assessment & Plan:  Hx of colon polyp: Will schedule a colonoscopy. The risks of bleeding, perforation and infection were reviewed with patient.

## 2017-10-08 NOTE — Patient Instructions (Signed)
Coloscopy. The risks of bleeding, perforation and infection were reviewed with patient.

## 2017-10-20 DIAGNOSIS — J029 Acute pharyngitis, unspecified: Secondary | ICD-10-CM | POA: Diagnosis not present

## 2017-10-20 DIAGNOSIS — J209 Acute bronchitis, unspecified: Secondary | ICD-10-CM | POA: Diagnosis not present

## 2017-10-20 DIAGNOSIS — J019 Acute sinusitis, unspecified: Secondary | ICD-10-CM | POA: Diagnosis not present

## 2017-10-29 ENCOUNTER — Encounter (INDEPENDENT_AMBULATORY_CARE_PROVIDER_SITE_OTHER): Payer: Self-pay | Admitting: *Deleted

## 2017-10-29 ENCOUNTER — Telehealth (INDEPENDENT_AMBULATORY_CARE_PROVIDER_SITE_OTHER): Payer: Self-pay | Admitting: *Deleted

## 2017-10-29 DIAGNOSIS — Z713 Dietary counseling and surveillance: Secondary | ICD-10-CM | POA: Diagnosis not present

## 2017-10-29 DIAGNOSIS — Z6836 Body mass index (BMI) 36.0-36.9, adult: Secondary | ICD-10-CM | POA: Diagnosis not present

## 2017-10-29 DIAGNOSIS — Z299 Encounter for prophylactic measures, unspecified: Secondary | ICD-10-CM | POA: Diagnosis not present

## 2017-10-29 DIAGNOSIS — I10 Essential (primary) hypertension: Secondary | ICD-10-CM | POA: Diagnosis not present

## 2017-10-29 MED ORDER — PEG 3350-KCL-NA BICARB-NACL 420 G PO SOLR: 4000.0000 mL | Freq: Once | ORAL | 0 refills | Status: AC

## 2017-10-29 NOTE — Telephone Encounter (Signed)
Patient needs trilyte 

## 2017-11-06 DIAGNOSIS — H04123 Dry eye syndrome of bilateral lacrimal glands: Secondary | ICD-10-CM | POA: Diagnosis not present

## 2017-11-06 DIAGNOSIS — H524 Presbyopia: Secondary | ICD-10-CM | POA: Diagnosis not present

## 2017-11-06 DIAGNOSIS — L719 Rosacea, unspecified: Secondary | ICD-10-CM | POA: Diagnosis not present

## 2017-11-29 ENCOUNTER — Encounter (HOSPITAL_COMMUNITY)
Admission: RE | Disposition: A | Discharge status: Home / Self Care | Payer: Self-pay | Source: Ambulatory Visit | Attending: Internal Medicine

## 2017-11-29 ENCOUNTER — Encounter (HOSPITAL_COMMUNITY): Payer: Self-pay

## 2017-11-29 ENCOUNTER — Other Ambulatory Visit: Payer: Self-pay

## 2017-11-29 ENCOUNTER — Ambulatory Visit (HOSPITAL_COMMUNITY)
Admission: RE | Admit: 2017-11-29 | Discharge: 2017-11-29 | Disposition: A | Discharge status: Home / Self Care | Payer: MEDICARE | Source: Ambulatory Visit | Attending: Internal Medicine | Admitting: Internal Medicine

## 2017-11-29 DIAGNOSIS — I2510 Atherosclerotic heart disease of native coronary artery without angina pectoris: Secondary | ICD-10-CM | POA: Insufficient documentation

## 2017-11-29 DIAGNOSIS — Z79899 Other long term (current) drug therapy: Secondary | ICD-10-CM | POA: Diagnosis not present

## 2017-11-29 DIAGNOSIS — Z7982 Long term (current) use of aspirin: Secondary | ICD-10-CM | POA: Insufficient documentation

## 2017-11-29 DIAGNOSIS — Z86010 Personal history of colonic polyps: Secondary | ICD-10-CM | POA: Insufficient documentation

## 2017-11-29 DIAGNOSIS — Z1211 Encounter for screening for malignant neoplasm of colon: Secondary | ICD-10-CM | POA: Diagnosis not present

## 2017-11-29 DIAGNOSIS — Z882 Allergy status to sulfonamides status: Secondary | ICD-10-CM | POA: Diagnosis not present

## 2017-11-29 DIAGNOSIS — K644 Residual hemorrhoidal skin tags: Secondary | ICD-10-CM | POA: Insufficient documentation

## 2017-11-29 DIAGNOSIS — Z09 Encounter for follow-up examination after completed treatment for conditions other than malignant neoplasm: Secondary | ICD-10-CM | POA: Diagnosis not present

## 2017-11-29 DIAGNOSIS — D123 Benign neoplasm of transverse colon: Secondary | ICD-10-CM | POA: Diagnosis not present

## 2017-11-29 DIAGNOSIS — K635 Polyp of colon: Secondary | ICD-10-CM | POA: Insufficient documentation

## 2017-11-29 DIAGNOSIS — Z91041 Radiographic dye allergy status: Secondary | ICD-10-CM | POA: Insufficient documentation

## 2017-11-29 DIAGNOSIS — E782 Mixed hyperlipidemia: Secondary | ICD-10-CM | POA: Insufficient documentation

## 2017-11-29 DIAGNOSIS — G20 Parkinson's disease: Secondary | ICD-10-CM | POA: Diagnosis not present

## 2017-11-29 DIAGNOSIS — I10 Essential (primary) hypertension: Secondary | ICD-10-CM | POA: Insufficient documentation

## 2017-11-29 DIAGNOSIS — K219 Gastro-esophageal reflux disease without esophagitis: Secondary | ICD-10-CM | POA: Insufficient documentation

## 2017-11-29 DIAGNOSIS — K5900 Constipation, unspecified: Secondary | ICD-10-CM | POA: Diagnosis not present

## 2017-11-29 DIAGNOSIS — Z87891 Personal history of nicotine dependence: Secondary | ICD-10-CM | POA: Diagnosis not present

## 2017-11-29 DIAGNOSIS — K5730 Diverticulosis of large intestine without perforation or abscess without bleeding: Secondary | ICD-10-CM | POA: Diagnosis not present

## 2017-11-29 DIAGNOSIS — I252 Old myocardial infarction: Secondary | ICD-10-CM | POA: Diagnosis not present

## 2017-11-29 DIAGNOSIS — K5140 Inflammatory polyps of colon without complications: Secondary | ICD-10-CM | POA: Diagnosis not present

## 2017-11-29 HISTORY — PX: COLONOSCOPY: SHX5424

## 2017-11-29 HISTORY — PX: POLYPECTOMY: SHX5525

## 2017-11-29 SURGERY — COLONOSCOPY
Anesthesia: Moderate Sedation

## 2017-11-29 MED ORDER — MIDAZOLAM HCL 5 MG/5ML IJ SOLN
INTRAMUSCULAR | Status: AC
  Filled 2017-11-29: qty 10

## 2017-11-29 MED ORDER — MIDAZOLAM HCL 5 MG/5ML IJ SOLN
INTRAMUSCULAR | Status: DC | PRN
  Administered 2017-11-29 (×2): 2 mg via INTRAVENOUS

## 2017-11-29 MED ORDER — STERILE WATER FOR IRRIGATION IR SOLN
Status: DC | PRN
  Administered 2017-11-29: 2.5 mL

## 2017-11-29 MED ORDER — SODIUM CHLORIDE 0.9 % IV SOLN
INTRAVENOUS | Status: DC
  Administered 2017-11-29: 08:00:00 via INTRAVENOUS

## 2017-11-29 MED ORDER — POLYETHYLENE GLYCOL 3350 17 GM/SCOOP PO POWD: 8.5000 g | Freq: Every day | ORAL | 0 refills | Status: AC | PRN

## 2017-11-29 MED ORDER — MEPERIDINE HCL 50 MG/ML IJ SOLN
INTRAMUSCULAR | Status: DC | PRN
  Administered 2017-11-29 (×2): 25 mg via INTRAVENOUS

## 2017-11-29 MED ORDER — MEPERIDINE HCL 50 MG/ML IJ SOLN
INTRAMUSCULAR | Status: AC
  Filled 2017-11-29: qty 1

## 2017-11-29 NOTE — Discharge Instructions (Signed)
No aspirin or NSAIDs for 3 days. Resume other medications as before. Can take polyethylene glycol/MiraLAX half to 1 scoop every other day or or as needed. High fiber diet. No driving for 24 hours. Physician will call with biopsy results.   Colonoscopy, Adult, Care After This sheet gives you information about how to care for yourself after your procedure. Your doctor may also give you more specific instructions. If you have problems or questions, call your doctor. Follow these instructions at home: General instructions   For the first 24 hours after the procedure: ? Do not drive or use machinery. ? Do not sign important documents. ? Do not drink alcohol. ? Do your daily activities more slowly than normal. ? Eat foods that are soft and easy to digest. ? Rest often.  Take over-the-counter or prescription medicines only as told by your doctor.  It is up to you to get the results of your procedure. Ask your doctor, or the department performing the procedure, when your results will be ready. To help cramping and bloating:  Try walking around.  Put heat on your belly (abdomen) as told by your doctor. Use a heat source that your doctor recommends, such as a moist heat pack or a heating pad. ? Put a towel between your skin and the heat source. ? Leave the heat on for 20-30 minutes. ? Remove the heat if your skin turns bright red. This is especially important if you cannot feel pain, heat, or cold. You can get burned. Eating and drinking  Drink enough fluid to keep your pee (urine) clear or pale yellow.  Return to your normal diet as told by your doctor. Avoid heavy or fried foods that are hard to digest.  Avoid drinking alcohol for as long as told by your doctor. Contact a doctor if:  You have blood in your poop (stool) 2-3 days after the procedure. Get help right away if:  You have more than a small amount of blood in your poop.  You see large clumps of tissue (blood clots) in  your poop.  Your belly is swollen.  You feel sick to your stomach (nauseous).  You throw up (vomit).  You have a fever.  You have belly pain that gets worse, and medicine does not help your pain. This information is not intended to replace advice given to you by your health care provider. Make sure you discuss any questions you have with your health care provider. Document Released: 08/19/2010 Document Revised: 04/10/2016 Document Reviewed: 04/10/2016 Elsevier Interactive Patient Education  2017 [employer]  Hemorrhoids Hemorrhoids are swollen veins in and around the rectum or anus. There are two types of hemorrhoids:  Internal hemorrhoids. These occur in the veins that are just inside the rectum. They may poke through to the outside and become irritated and painful.  External hemorrhoids. These occur in the veins that are outside of the anus and can be felt as a painful swelling or hard lump near the anus.  Most hemorrhoids do not cause serious problems, and they can be managed with home treatments such as diet and lifestyle changes. If home treatments do not help your symptoms, procedures can be done to shrink or remove the hemorrhoids. What are the causes? This condition is caused by increased pressure in the anal area. This pressure may result from various things, including:  Constipation.  Straining to have a bowel movement.  Diarrhea.  Pregnancy.  Obesity.  Sitting for long periods of time.  Heavy lifting or other activity that causes you to strain.  Anal sex.  What are the signs or symptoms? Symptoms of this condition include:  Pain.  Anal itching or irritation.  Rectal bleeding.  Leakage of stool (feces).  Anal swelling.  One or more lumps around the anus.  How is this diagnosed? This condition can often be diagnosed through a visual exam. Other exams or tests may also be done, such as:  Examination of the rectal area with a gloved hand (digital  rectal exam).  Examination of the anal canal using a small tube (anoscope).  A blood test, if you have lost a significant amount of blood.  A test to look inside the colon (sigmoidoscopy or colonoscopy).  How is this treated? This condition can usually be treated at home. However, various procedures may be done if dietary changes, lifestyle changes, and other home treatments do not help your symptoms. These procedures can help make the hemorrhoids smaller or remove them completely. Some of these procedures involve surgery, and others do not. Common procedures include:  Rubber band ligation. Rubber bands are placed at the base of the hemorrhoids to cut off the blood supply to them.  Sclerotherapy. Medicine is injected into the hemorrhoids to shrink them.  Infrared coagulation. A type of light energy is used to get rid of the hemorrhoids.  Hemorrhoidectomy surgery. The hemorrhoids are surgically removed, and the veins that supply them are tied off.  Stapled hemorrhoidopexy surgery. A circular stapling device is used to remove the hemorrhoids and use staples to cut off the blood supply to them.  Follow these instructions at home: Eating and drinking  Eat foods that have a lot of fiber in them, such as whole grains, beans, nuts, fruits, and vegetables. Ask your health care provider about taking products that have added fiber (fiber supplements).  Drink enough fluid to keep your urine clear or pale yellow. Managing pain and swelling  Take warm sitz baths for 20 minutes, 3-4 times a day to ease pain and discomfort.  If directed, apply ice to the affected area. Using ice packs between sitz baths may be helpful. ? Put ice in a plastic bag. ? Place a towel between your skin and the bag. ? Leave the ice on for 20 minutes, 2-3 times a day. General instructions  Take over-the-counter and prescription medicines only as told by your health care provider.  Use medicated creams or  suppositories as told.  Exercise regularly.  Go to the bathroom when you have the urge to have a bowel movement. Do not wait.  Avoid straining to have bowel movements.  Keep the anal area dry and clean. Use wet toilet paper or moist towelettes after a bowel movement.  Do not sit on the toilet for long periods of time. This increases blood pooling and pain. Contact a health care provider if:  You have increasing pain and swelling that are not controlled by treatment or medicine.  You have uncontrolled bleeding.  You have difficulty having a bowel movement, or you are unable to have a bowel movement.  You have pain or inflammation outside the area of the hemorrhoids. This information is not intended to replace advice given to you by your health care provider. Make sure you discuss any questions you have with your health care provider. Document Released: 07/14/2000 Document Revised: 12/15/2015 Document Reviewed: 03/31/2015 Elsevier Interactive Patient Education  2018 [employer].  Diverticulosis Diverticulosis is a condition that develops when small pouches (diverticula)  form in the wall of the large intestine (colon). The colon is where water is absorbed and stool is formed. The pouches form when the inside layer of the colon pushes through weak spots in the outer layers of the colon. You may have a few pouches or many of them. What are the causes? The cause of this condition is not known. What increases the risk? The following factors may make you more likely to develop this condition:  Being older than age 60. Your risk for this condition increases with age. Diverticulosis is rare among people younger than age 72. By age 79, many people have it.  Eating a low-fiber diet.  Having frequent constipation.  Being overweight.  Not getting enough exercise.  Smoking.  Taking over-the-counter pain medicines, like aspirin and ibuprofen.  Having a family history of  diverticulosis.  What are the signs or symptoms? In most people, there are no symptoms of this condition. If you do have symptoms, they may include:  Bloating.  Cramps in the abdomen.  Constipation or diarrhea.  Pain in the lower left side of the abdomen.  How is this diagnosed? This condition is most often diagnosed during an exam for other colon problems. Because diverticulosis usually has no symptoms, it often cannot be diagnosed independently. This condition may be diagnosed by:  Using a flexible scope to examine the colon (colonoscopy).  Taking an X-ray of the colon after dye has been put into the colon (barium enema).  Doing a CT scan.  How is this treated? You may not need treatment for this condition if you have never developed an infection related to diverticulosis. If you have had an infection before, treatment may include:  Eating a high-fiber diet. This may include eating more fruits, vegetables, and grains.  Taking a fiber supplement.  Taking a live bacteria supplement (probiotic).  Taking medicine to relax your colon.  Taking antibiotic medicines.  Follow these instructions at home:  Drink 6-8 glasses of water or more each day to prevent constipation.  Try not to strain when you have a bowel movement.  If you have had an infection before: ? Eat more fiber as directed by your health care provider or your diet and nutrition specialist (dietitian). ? Take a fiber supplement or probiotic, if your health care provider approves.  Take over-the-counter and prescription medicines only as told by your health care provider.  If you were prescribed an antibiotic, take it as told by your health care provider. Do not stop taking the antibiotic even if you start to feel better.  Keep all follow-up visits as told by your health care provider. This is important. Contact a health care provider if:  You have pain in your abdomen.  You have bloating.  You have  cramps.  You have not had a bowel movement in 3 days. Get help right away if:  Your pain gets worse.  Your bloating becomes very bad.  You have a fever or chills, and your symptoms suddenly get worse.  You vomit.  You have bowel movements that are bloody or black.  You have bleeding from your rectum. Summary  Diverticulosis is a condition that develops when small pouches (diverticula) form in the wall of the large intestine (colon).  You may have a few pouches or many of them.  This condition is most often diagnosed during an exam for other colon problems.  If you have had an infection related to diverticulosis, treatment may include increasing the fiber  in your diet, taking supplements, or taking medicines. This information is not intended to replace advice given to you by your health care provider. Make sure you discuss any questions you have with your health care provider. Document Released: 04/13/2004 Document Revised: 06/05/2016 Document Reviewed: 06/05/2016 Elsevier Interactive Patient Education  2017 [employer].  Colon Polyps Polyps are tissue growths inside the body. Polyps can grow in many places, including the large intestine (colon). A polyp may be a round bump or a mushroom-shaped growth. You could have one polyp or several. Most colon polyps are noncancerous (benign). However, some colon polyps can become cancerous over time. What are the causes? The exact cause of colon polyps is not known. What increases the risk? This condition is more likely to develop in people who:  Have a family history of colon cancer or colon polyps.  Are older than 13 or older than 45 if they are African American.  Have inflammatory bowel disease, such as ulcerative colitis or Crohn disease.  Are overweight.  Smoke cigarettes.  Do not get enough exercise.  Drink too much alcohol.  Eat a diet that is: ? High in fat and red meat. ? Low in fiber.  Had childhood cancer that  was treated with abdominal radiation.  What are the signs or symptoms? Most polyps do not cause symptoms. If you have symptoms, they may include:  Blood coming from your rectum when having a bowel movement.  Blood in your stool.The stool may look dark red or black.  A change in bowel habits, such as constipation or diarrhea.  How is this diagnosed? This condition is diagnosed with a colonoscopy. This is a procedure that uses a lighted, flexible scope to look at the inside of your colon. How is this treated? Treatment for this condition involves removing any polyps that are found. Those polyps will then be tested for cancer. If cancer is found, your health care provider will talk to you about options for colon cancer treatment. Follow these instructions at home: Diet  Eat plenty of fiber, such as fruits, vegetables, and whole grains.  Eat foods that are high in calcium and vitamin D, such as milk, cheese, yogurt, eggs, liver, fish, and broccoli.  Limit foods high in fat, red meats, and processed meats, such as hot dogs, sausage, bacon, and lunch meats.  Maintain a healthy weight, or lose weight if recommended by your health care provider. General instructions  Do not smoke cigarettes.  Do not drink alcohol excessively.  Keep all follow-up visits as told by your health care provider. This is important. This includes keeping regularly scheduled colonoscopies. Talk to your health care provider about when you need a colonoscopy.  Exercise every day or as told by your health care provider. Contact a health care provider if:  You have new or worsening bleeding during a bowel movement.  You have new or increased blood in your stool.  You have a change in bowel habits.  You unexpectedly lose weight. This information is not intended to replace advice given to you by your health care provider. Make sure you discuss any questions you have with your health care provider. Document  Released: 04/12/2004 Document Revised: 12/23/2015 Document Reviewed: 06/07/2015 Elsevier Interactive Patient Education  [employer].

## 2017-11-29 NOTE — H&P (Signed)
David Combs is an 72 y.o. male.   Chief Complaint: Patient is here for colonoscopy. HPI: Patient is 72 year old Caucasian male with history of colonic polyps and is here for surveillance colonoscopy.  He states he has had problems with constipation since he was diagnosed with Parkinson's disease in December this year.  He is taking stool softener which is helping some.  He denies melena or rectal bleeding.  Last colonoscopy was in January 2014. Family history is negative for CRC.  Past Medical History:  Diagnosis Date  . Coronary atherosclerosis of native coronary artery    70-80% small diagonal - medically managed, LVEF 55%  . Dermatophytosis of nail   . ED (erectile dysfunction)   . Essential hypertension, benign   . GERD (gastroesophageal reflux disease)   . GERD (gastroesophageal reflux disease)   . History of colonic polyps 10/08/2017  . Lumbago   . Mixed hyperlipidemia   . Non-ST elevation myocardial infarction (NSTEMI) ([place])    11/11  . Obesity   . Prostatitis, unspecified     Past Surgical History:  Procedure Laterality Date  . APPENDECTOMY    . CERVICAL SPINE SURGERY    . CHOLECYSTECTOMY     05/2014. rt upper quadrant pain   . ESOPHAGOGASTRODUODENOSCOPY N/A 12/02/2014   Procedure: ESOPHAGOGASTRODUODENOSCOPY (EGD);  Surgeon: Rogene Houston, MD;  Location: [facility];  Service: Endoscopy;  Laterality: N/A;  200  . Left wrist fracture surgery  1994  . Right knee surgery    . TONSILLECTOMY  AGE 30    History reviewed. No pertinent family history. Social History:  reports that he quit smoking about 15 years ago. His smoking use included cigarettes. He has a 40.00 pack-year smoking history. He has never used smokeless tobacco. He reports that he does not drink alcohol or use drugs.  Allergies:  Allergies  Allergen Reactions  . Iodinated Diagnostic Agents Swelling  . Sulfa Drugs Cross Reactors Itching    Medications Prior to Admission  Medication Sig Dispense  Refill  . amLODipine (NORVASC) 5 MG tablet Take 1.5 tablets (7.5 mg total) daily by mouth. (Patient taking differently: Take 5 mg by mouth daily. ) 135 tablet 1  . aspirin EC 81 MG tablet Take 81 mg by mouth daily.      . carbidopa-levodopa (SINEMET IR) 25-100 MG tablet Take 2 tablets by mouth 3 (three) times daily.     . carboxymethylcellulose (REFRESH PLUS) 0.5 % SOLN Place 1 drop into both eyes 3 (three) times daily as needed (for dry eyes).     . Cholecalciferol (VITAMIN D3) 1000 units CAPS Take 1,000 Units by mouth 2 (two) times daily.     Marland Kitchen docusate sodium (COLACE) 100 MG capsule Take 100 mg by mouth daily.    Marland Kitchen GAVILYTE-N WITH FLAVOR PACK 420 g solution Take 4,000 mLs by mouth once.  0  . glucosamine-chondroitin 500-400 MG tablet Take 1 tablet by mouth 2 (two) times daily.     Marland Kitchen loratadine (CLARITIN) 10 MG tablet Take 10 mg by mouth daily.    . Omega-3 Fatty Acids (FISH OIL) 1200 MG CAPS Take 1,200 mg by mouth 2 (two) times daily.    Marland Kitchen omeprazole (PRILOSEC) 20 MG capsule Take 20 mg by mouth every evening.     . rosuvastatin (CRESTOR) 5 MG tablet Take 2.5 mg by mouth daily.     . traZODone (DESYREL) 100 MG tablet Take 100 mg by mouth at bedtime as needed for sleep.     Marland Kitchen  acetaminophen (TYLENOL) 325 MG tablet Take 650 mg by mouth every 6 (six) hours as needed for moderate pain or headache.     . nitroGLYCERIN (NITROSTAT) 0.4 MG SL tablet Place 1 tablet (0.4 mg total) under the tongue every 5 (five) minutes as needed for chest pain. 75 tablet 3    No results found for this or any previous visit (from the past 48 hour(s)). No results found.  ROS  Blood pressure (!) 145/80, pulse (!) 57, temperature 98.2 F (36.8 C), temperature source Oral, resp. rate 14, SpO2 97 %. Physical Exam  Constitutional: He appears well-developed and well-nourished.  HENT:  Mouth/Throat: Oropharynx is clear and moist.  Eyes: Conjunctivae are normal. No scleral icterus.  Neck: No thyromegaly present.   Cardiovascular: Normal rate, regular rhythm and normal heart sounds.  No murmur heard. Respiratory: Effort normal and breath sounds normal.  GI: Soft. He exhibits no distension and no mass.  Appendectomy scar.  Musculoskeletal: He exhibits no edema.  Lymphadenopathy:    He has no cervical adenopathy.  Neurological: He is alert.  Skin: Skin is warm and dry.     Assessment/Plan History of colonic polyps. Recent onset of constipation secondary to Parkinson's disease and medications. Surveillance colonoscopy.  [name], MD 11/29/2017, 8:47 AM

## 2017-11-29 NOTE — Op Note (Signed)
[facility] Patient Name: David Combs Procedure Date: 11/29/2017 8:23 AM MRN: [id number] Date of Birth: [birth year] Attending MD: [name] , MD CSN: [id number] Age: 72 Admit Type: Outpatient Procedure:                Colonoscopy Indications:              High risk colon cancer surveillance: Personal                            history of colonic polyps Providers:                [name], MD, Lurline Del, RN, Aram Candela Referring MD:             Glenda Chroman, MD Medicines:                Meperidine 50 mg IV, Midazolam 4 mg IV Complications:            No immediate complications. Estimated Blood Loss:     Estimated blood loss was minimal. Procedure:                Pre-Anesthesia Assessment:                           - Prior to the procedure, a History and Physical                            was performed, and patient medications and                            allergies were reviewed. The patient's tolerance of                            previous anesthesia was also reviewed. The risks                            and benefits of the procedure and the sedation                            options and risks were discussed with the patient.                            All questions were answered, and informed consent                            was obtained. Prior Anticoagulants: The patient                            last took aspirin 2 days prior to the procedure.                            ASA Grade Assessment: III - A patient with severe                            systemic disease. After reviewing the risks and  benefits, the patient was deemed in satisfactory                            condition to undergo the procedure.                           After obtaining informed consent, the colonoscope                            was passed under direct vision. Throughout the                            procedure, the patient's blood pressure, pulse, and                          oxygen saturations were monitored continuously. The                            [id number] ([id number]) scope was introduced through                            the anus and advanced to the the cecum, identified                            by appendiceal orifice and ileocecal valve. The                            colonoscopy was somewhat difficult due to a                            redundant colon. The patient tolerated the                            procedure well. The quality of the bowel                            preparation was adequate. The ileocecal valve,                            appendiceal orifice, and rectum were photographed. Scope In: 9:01:35 AM Scope Out: 9:32:32 AM Scope Withdrawal Time: 0 hours 20 minutes 30 seconds  Total Procedure Duration: 0 hours 30 minutes 57 seconds  Findings:      The perianal and digital rectal examinations were normal.      Scattered medium-mouthed diverticula were found in the entire colon.      A 5 mm polyp was found in the transverse colon. The polyp was removed       with a cold snare. Resection and retrieval were complete. To stop active       bleeding, one hemostatic clip was successfully placed (MR conditional).       There was no bleeding at the end of the procedure.      External hemorrhoids were found during retroflexion. The hemorrhoids       were small. Impression:               -  Diverticulosis in the entire examined colon.                           - One 5 mm polyp in the transverse colon, removed                            with a cold snare. Resected and retrieved. Clip (MR                            conditional) was placed.                           - External hemorrhoids. Moderate Sedation:      Moderate (conscious) sedation was administered by the endoscopy nurse       and supervised by the endoscopist. The following parameters were       monitored: oxygen saturation, heart rate, blood pressure, CO2        capnography and response to care. Total physician intraservice time was       35 minutes. Recommendation:           - Patient has a contact number available for                            emergencies. The signs and symptoms of potential                            delayed complications were discussed with the                            patient. Return to normal activities tomorrow.                            Written discharge instructions were provided to the                            patient.                           - High fiber diet today.                           - Continue present medications.                           - No aspirin, ibuprofen, naproxen, or other                            non-steroidal anti-inflammatory drugs for 3 days.                           - Await pathology results.                           - Repeat colonoscopy is recommended. The  colonoscopy date will be determined after pathology                            results from today's exam become available for                            review. Procedure Code(s):        --- Professional ---                           [phone], Colonoscopy, flexible; with removal of                            tumor(s), polyp(s), or other lesion(s) by snare                            technique                           G0500, Moderate sedation services provided by the                            same physician or other qualified health care                            professional performing a gastrointestinal                            endoscopic service that sedation supports,                            requiring the presence of an independent trained                            observer to assist in the monitoring of the                            patient's level of consciousness and physiological                            status; initial 15 minutes of intra-service time;                            patient  age 71 years or older (additional time may                            be reported with [id number], as appropriate)                           [id number], Moderate sedation services provided by the                            same physician or other qualified health care  professional performing the diagnostic or                            therapeutic service that the sedation supports,                            requiring the presence of an independent trained                            observer to assist in the monitoring of the                            patient's level of consciousness and physiological                            status; each additional 15 minutes intraservice                            time (List separately in addition to code for                            primary service) Diagnosis Code(s):        --- Professional ---                           Z86.010, Personal history of colonic polyps                           K64.4, Residual hemorrhoidal skin tags                           D12.3, Benign neoplasm of transverse colon (hepatic                            flexure or splenic flexure)                           K57.30, Diverticulosis of large intestine without                            perforation or abscess without bleeding CPT copyright 2017 [employer]. All rights reserved. The codes documented in this report are preliminary and upon coder review may  be revised to meet current compliance requirements. [name], MD [name], MD 11/29/2017 9:41:08 AM This report has been signed electronically. Number of Addenda: 0

## 2017-12-04 ENCOUNTER — Encounter (HOSPITAL_COMMUNITY): Payer: Self-pay | Admitting: Internal Medicine

## 2018-02-05 DIAGNOSIS — M545 Low back pain: Secondary | ICD-10-CM | POA: Diagnosis not present

## 2018-02-08 ENCOUNTER — Encounter (HOSPITAL_COMMUNITY): Payer: Self-pay | Admitting: Medical

## 2018-02-08 ENCOUNTER — Other Ambulatory Visit (HOSPITAL_COMMUNITY): Payer: MEDICARE

## 2018-02-08 ENCOUNTER — Inpatient Hospital Stay (HOSPITAL_COMMUNITY)
Admission: AD | Admit: 2018-02-08 | Discharge: 2018-02-12 | DRG: 287 | Disposition: A | Discharge status: Home / Self Care | Payer: MEDICARE | Source: Other Acute Inpatient Hospital | Attending: Cardiovascular Disease | Admitting: Cardiovascular Disease

## 2018-02-08 DIAGNOSIS — I10 Essential (primary) hypertension: Secondary | ICD-10-CM

## 2018-02-08 DIAGNOSIS — I25110 Atherosclerotic heart disease of native coronary artery with unstable angina pectoris: Secondary | ICD-10-CM | POA: Diagnosis not present

## 2018-02-08 DIAGNOSIS — Z9049 Acquired absence of other specified parts of digestive tract: Secondary | ICD-10-CM

## 2018-02-08 DIAGNOSIS — E7800 Pure hypercholesterolemia, unspecified: Secondary | ICD-10-CM | POA: Diagnosis not present

## 2018-02-08 DIAGNOSIS — Z7982 Long term (current) use of aspirin: Secondary | ICD-10-CM

## 2018-02-08 DIAGNOSIS — Z91041 Radiographic dye allergy status: Secondary | ICD-10-CM

## 2018-02-08 DIAGNOSIS — F329 Major depressive disorder, single episode, unspecified: Secondary | ICD-10-CM | POA: Diagnosis not present

## 2018-02-08 DIAGNOSIS — I4891 Unspecified atrial fibrillation: Principal | ICD-10-CM | POA: Diagnosis present

## 2018-02-08 DIAGNOSIS — R072 Precordial pain: Secondary | ICD-10-CM | POA: Diagnosis not present

## 2018-02-08 DIAGNOSIS — I959 Hypotension, unspecified: Secondary | ICD-10-CM | POA: Diagnosis present

## 2018-02-08 DIAGNOSIS — I252 Old myocardial infarction: Secondary | ICD-10-CM

## 2018-02-08 DIAGNOSIS — I4892 Unspecified atrial flutter: Secondary | ICD-10-CM | POA: Diagnosis present

## 2018-02-08 DIAGNOSIS — Z807 Family history of other malignant neoplasms of lymphoid, hematopoietic and related tissues: Secondary | ICD-10-CM | POA: Diagnosis not present

## 2018-02-08 DIAGNOSIS — Z79899 Other long term (current) drug therapy: Secondary | ICD-10-CM | POA: Diagnosis not present

## 2018-02-08 DIAGNOSIS — I200 Unstable angina: Secondary | ICD-10-CM | POA: Diagnosis present

## 2018-02-08 DIAGNOSIS — G20 Parkinson's disease: Secondary | ICD-10-CM | POA: Diagnosis present

## 2018-02-08 DIAGNOSIS — E782 Mixed hyperlipidemia: Secondary | ICD-10-CM | POA: Diagnosis present

## 2018-02-08 DIAGNOSIS — Z882 Allergy status to sulfonamides status: Secondary | ICD-10-CM

## 2018-02-08 DIAGNOSIS — I2510 Atherosclerotic heart disease of native coronary artery without angina pectoris: Secondary | ICD-10-CM | POA: Diagnosis present

## 2018-02-08 DIAGNOSIS — Z87891 Personal history of nicotine dependence: Secondary | ICD-10-CM | POA: Diagnosis not present

## 2018-02-08 DIAGNOSIS — Z86010 Personal history of colonic polyps: Secondary | ICD-10-CM | POA: Diagnosis not present

## 2018-02-08 DIAGNOSIS — K219 Gastro-esophageal reflux disease without esophagitis: Secondary | ICD-10-CM | POA: Diagnosis not present

## 2018-02-08 DIAGNOSIS — E785 Hyperlipidemia, unspecified: Secondary | ICD-10-CM | POA: Diagnosis not present

## 2018-02-08 DIAGNOSIS — F419 Anxiety disorder, unspecified: Secondary | ICD-10-CM | POA: Diagnosis not present

## 2018-02-08 DIAGNOSIS — F4310 Post-traumatic stress disorder, unspecified: Secondary | ICD-10-CM | POA: Diagnosis not present

## 2018-02-08 DIAGNOSIS — R079 Chest pain, unspecified: Secondary | ICD-10-CM | POA: Diagnosis not present

## 2018-02-08 HISTORY — DX: Low back pain, unspecified: M54.50

## 2018-02-08 HISTORY — DX: Anxiety disorder, unspecified: F41.9

## 2018-02-08 HISTORY — DX: Parkinson's disease without dyskinesia, without mention of fluctuations: G20.A1

## 2018-02-08 HISTORY — DX: Low back pain: M54.5

## 2018-02-08 HISTORY — DX: Depression, unspecified: F32.A

## 2018-02-08 HISTORY — DX: Other chronic pain: G89.29

## 2018-02-08 HISTORY — DX: Parkinson's disease: G20

## 2018-02-08 HISTORY — DX: Sleep apnea, unspecified: G47.30

## 2018-02-08 HISTORY — DX: Unspecified osteoarthritis, unspecified site: M19.90

## 2018-02-08 HISTORY — DX: Contact with and (suspected) exposure to other hazardous, chiefly nonmedicinal, chemicals: Z77.098

## 2018-02-08 HISTORY — DX: Major depressive disorder, single episode, unspecified: F32.9

## 2018-02-08 LAB — TROPONIN I
TROPONIN I: 0.03 ng/mL — AB (ref <0.03)
Troponin I: 0.03 ng/mL (ref <0.03)

## 2018-02-08 LAB — MRSA PCR SCREENING: MRSA BY PCR: NEGATIVE

## 2018-02-08 LAB — TSH: TSH: 1.628 u[IU]/mL (ref 0.350–4.500)

## 2018-02-08 MED ORDER — CARBIDOPA-LEVODOPA 25-100 MG PO TABS
2.0000 | ORAL_TABLET | Freq: Three times a day (TID) | ORAL | Status: DC
  Filled 2018-02-08: qty 2

## 2018-02-08 MED ORDER — ASPIRIN EC 81 MG PO TBEC
81.0000 mg | DELAYED_RELEASE_TABLET | Freq: Every day | ORAL | Status: DC
  Administered 2018-02-09 – 2018-02-12 (×3): 81 mg via ORAL
  Filled 2018-02-08 (×4): qty 1

## 2018-02-08 MED ORDER — DOCUSATE SODIUM 100 MG PO CAPS
100.0000 mg | ORAL_CAPSULE | Freq: Every day | ORAL | Status: DC
  Administered 2018-02-09 – 2018-02-10 (×2): 100 mg via ORAL
  Filled 2018-02-08 (×4): qty 1

## 2018-02-08 MED ORDER — ROSUVASTATIN CALCIUM 5 MG PO TABS
2.5000 mg | ORAL_TABLET | Freq: Every day | ORAL | Status: DC
  Administered 2018-02-08 – 2018-02-12 (×5): 2.5 mg via ORAL
  Filled 2018-02-08 (×6): qty 0.5

## 2018-02-08 MED ORDER — ONDANSETRON HCL 4 MG/2ML IJ SOLN: 4.0000 mg | Freq: Four times a day (QID) | INTRAMUSCULAR | Status: DC | PRN

## 2018-02-08 MED ORDER — DILTIAZEM HCL-DEXTROSE 100-5 MG/100ML-% IV SOLN (PREMIX)
5.0000 mg/h | INTRAVENOUS | Status: DC
  Administered 2018-02-08 – 2018-02-11 (×4): 5 mg/h via INTRAVENOUS
  Filled 2018-02-08 (×4): qty 100

## 2018-02-08 MED ORDER — AMLODIPINE BESYLATE 5 MG PO TABS: 5.0000 mg | ORAL_TABLET | Freq: Every day | ORAL | Status: DC

## 2018-02-08 MED ORDER — POLYVINYL ALCOHOL 1.4 % OP SOLN: 1.0000 [drp] | Freq: Three times a day (TID) | OPHTHALMIC | Status: DC | PRN

## 2018-02-08 MED ORDER — TRAZODONE HCL 50 MG PO TABS: 100.0000 mg | ORAL_TABLET | Freq: Every evening | ORAL | Status: DC | PRN

## 2018-02-08 MED ORDER — PANTOPRAZOLE SODIUM 40 MG PO TBEC
40.0000 mg | DELAYED_RELEASE_TABLET | Freq: Every day | ORAL | Status: DC
  Administered 2018-02-09 – 2018-02-12 (×4): 40 mg via ORAL
  Filled 2018-02-08 (×4): qty 1

## 2018-02-08 MED ORDER — ACETAMINOPHEN 325 MG PO TABS: 650.0000 mg | ORAL_TABLET | Freq: Four times a day (QID) | ORAL | Status: DC | PRN

## 2018-02-08 MED ORDER — NITROGLYCERIN 0.4 MG SL SUBL: 0.4000 mg | SUBLINGUAL_TABLET | SUBLINGUAL | Status: DC | PRN

## 2018-02-08 MED ORDER — CARBIDOPA-LEVODOPA 25-100 MG PO TABS
2.0000 | ORAL_TABLET | Freq: Three times a day (TID) | ORAL | Status: DC
  Administered 2018-02-08 – 2018-02-12 (×13): 2 via ORAL
  Filled 2018-02-08 (×13): qty 2

## 2018-02-08 MED ORDER — LORATADINE 10 MG PO TABS
10.0000 mg | ORAL_TABLET | Freq: Every day | ORAL | Status: DC
Start: 2018-02-09 — End: 2018-02-12
  Administered 2018-02-09 – 2018-02-12 (×4): 10 mg via ORAL
  Filled 2018-02-08 (×4): qty 1

## 2018-02-08 MED ORDER — HEPARIN (PORCINE) IN NACL 100-0.45 UNIT/ML-% IJ SOLN
1250.0000 [IU]/h | INTRAMUSCULAR | Status: DC
  Administered 2018-02-08 – 2018-02-12 (×3): 1250 [IU]/h via INTRAVENOUS
  Filled 2018-02-08 (×5): qty 250

## 2018-02-08 MED ORDER — POLYETHYLENE GLYCOL 3350 17 G PO PACK: 8.5000 g | PACK | Freq: Every day | ORAL | Status: DC | PRN

## 2018-02-08 MED ORDER — VITAMIN D 1000 UNITS PO TABS
1000.0000 [IU] | ORAL_TABLET | Freq: Two times a day (BID) | ORAL | Status: DC
  Administered 2018-02-09 – 2018-02-12 (×7): 1000 [IU] via ORAL
  Filled 2018-02-08 (×7): qty 1

## 2018-02-08 MED ORDER — DILTIAZEM LOAD VIA INFUSION
20.0000 mg | Freq: Once | INTRAVENOUS | Status: AC | PRN
  Administered 2018-02-08: 20 mg via INTRAVENOUS
  Filled 2018-02-08: qty 20

## 2018-02-08 NOTE — H&P (Signed)
Cardiology Admission History and Physical:   Patient ID: David Combs; MRN: [id number]; DOB: 1946/04/04   Admission date: 02/08/2018  Primary Care Provider: Glenda Chroman, MD Primary Cardiologist: Rozann Lesches, MD  Primary Electrophysiologist:  None  Chief Complaint:  Chest pain  Patient Profile:   David Combs is a 72 y.o. male with a history of CAD s/p NSTEMI 2011 (LHC revealed 70-80% stenosis of small diagonal; medically managed), HTN, HLD, parkinson's disease, and PTSD, who presented to [facility] with chest pain and was found to have new onset atrial fibrillation with RVR. Transferred to [facility] for further management.   History of Present Illness:   David Combs was awoken from sleep this morning at 6am with left sided chest pressure with radiation of pain to his left arm. He reports taking 3 SL nitro at home without relief of symptoms. Given persistence, he activated EMS and was taken to [facility] for further evaluation. There he was found to be in atrial fibrillation with rate up to the 140s. He was given IV labetalol and an diltiazem in an effort to control his rate. He was given ASA and an additional SL nitro with some improvement in his chest pain but did not completely resolve it. And he was given IVF to assist with intermittent hypotension. Patient requested transfer to [facility] for further care.   On further questioning, he reports having intermittent non-exertional chest pressure for the past week. Generally 1 episode a day which last for 2-3 minutes at a time and resolves spontaneously. He denies associated diaphoresis, dizziness, lightheadedness, syncope, nausea, or vomiting with any of his chest pressure episodes. He denies the sensation of his heart racing, palpitations, or fluttering in his chest. He continues to have 3/10 chest and L arm pain. He reports presenting to his spine doctor 02/05/18 with complaints of back pain and was prescribed a 60mg  steroid dose pack, of which  he has taken 2 doses with some improvement in his back pain. He also reports constipation with last BM 3 days ago. He denies recent falls, fevers, URIs, hematuria, hematochezia, or melena.   He was last seen by Dr. Domenic Polite 05/2017 in follow-up of recent admission for chest pain. He was recommended to increase his amlodipine to 10mg  daily, however reported feeling worse, so lowered his dose back to 5mg  daily. He was without CP complaints at that time and was recommended to continue his current medications and follow-up in 6 months. His last ischemic evaluation was a NST in 2017 which was without ischemia. [place] 2011 revealed 70-80% stenosis of a small diagonal vessel which was medically managed; EF 55-60% at that time.   [facility] course: Initially hypertensive, followed by episodes of hypotension, Tachycardic to the 130s-140s at presentation, improved to 110s, otherwise VSS. Labs notable for electrolytes wnl, Cr 0.82, Hgb 16.1, PLT 220, Pro-BNP 308, Trop <0.01 x2. EKG with atrial fibrillation rate 144, with STD in anterolateral leads. CXR with no acute findings. He was transferred to [facility] for further care.   Past Medical History:  Diagnosis Date  . Coronary atherosclerosis of native coronary artery    70-80% small diagonal - medically managed, LVEF 55%  . Dermatophytosis of nail   . ED (erectile dysfunction)   . Essential hypertension, benign   . GERD (gastroesophageal reflux disease)   . GERD (gastroesophageal reflux disease)   . History of colonic polyps 10/08/2017  . Lumbago   . Mixed hyperlipidemia   . Non-ST elevation myocardial infarction (  NSTEMI) ([place])    11/11  . Obesity   . Prostatitis, unspecified     Past Surgical History:  Procedure Laterality Date  . APPENDECTOMY    . CERVICAL SPINE SURGERY    . CHOLECYSTECTOMY     05/2014. rt upper quadrant pain   . COLONOSCOPY N/A 11/29/2017   Procedure: COLONOSCOPY;  Surgeon: Rogene Houston, MD;  Location: [facility];   Service: Endoscopy;  Laterality: N/A;  8:30  . ESOPHAGOGASTRODUODENOSCOPY N/A 12/02/2014   Procedure: ESOPHAGOGASTRODUODENOSCOPY (EGD);  Surgeon: Rogene Houston, MD;  Location: [facility] END[facility];  Service: Endoscopy;  Laterality: N/A;  200  . Left wrist fracture surgery  1994  . POLYPECTOMY  11/29/2017   Procedure: POLYPECTOMY;  Surgeon: Rogene Houston, MD;  Location: [facility];  Service: Endoscopy;;  transverse  . Right knee surgery    . TONSILLECTOMY  AGE 69     Medications Prior to Admission: Prior to Admission medications   Medication Sig Start Date End Date Taking? Authorizing Provider  acetaminophen (TYLENOL) 325 MG tablet Take 650 mg by mouth every 6 (six) hours as needed for moderate pain or headache.     [provider]  amLODipine (NORVASC) 5 MG tablet Take 1.5 tablets (7.5 mg total) daily by mouth. Patient taking differently: Take 5 mg by mouth daily.  06/15/17 11/22/17  Satira Sark, MD  aspirin EC 81 MG tablet Take 1 tablet (81 mg total) by mouth daily. 12/02/17   Rehman, Mechele Dawley, MD  carbidopa-levodopa (SINEMET IR) 25-100 MG tablet Take 2 tablets by mouth 3 (three) times daily.     [provider]  carboxymethylcellulose (REFRESH PLUS) 0.5 % SOLN Place 1 drop into both eyes 3 (three) times daily as needed (for dry eyes).     [provider]  Cholecalciferol (VITAMIN D3) 1000 units CAPS Take 1,000 Units by mouth 2 (two) times daily.     [provider]  docusate sodium (COLACE) 100 MG capsule Take 100 mg by mouth daily.    [provider]  GAVILYTE-N WITH FLAVOR PACK 420 g solution Take 4,000 mLs by mouth once. 10/29/17   [provider]  glucosamine-chondroitin 500-400 MG tablet Take 1 tablet by mouth 2 (two) times daily.     [provider]  loratadine (CLARITIN) 10 MG tablet Take 10 mg by mouth daily.    [provider]  nitroGLYCERIN (NITROSTAT) 0.4 MG SL tablet Place 1 tablet (0.4 mg total) under the  tongue every 5 (five) minutes as needed for chest pain. 08/06/15   Satira Sark, MD  Omega-3 Fatty Acids (FISH OIL) 1200 MG CAPS Take 1,200 mg by mouth 2 (two) times daily.    [provider]  omeprazole (PRILOSEC) 20 MG capsule Take 20 mg by mouth every evening.     [provider]  polyethylene glycol powder (GLYCOLAX/MIRALAX) powder Take 8.5 g by mouth daily as needed. 11/29/17   Rehman, Mechele Dawley, MD  rosuvastatin (CRESTOR) 5 MG tablet Take 2.5 mg by mouth daily.     [provider]  traZODone (DESYREL) 100 MG tablet Take 100 mg by mouth at bedtime as needed for sleep.     [provider]     Allergies:    Allergies  Allergen Reactions  . Iodinated Diagnostic Agents Swelling  . Sulfa Drugs Cross Reactors Itching    Social History:   Social History   Socioeconomic History  . Marital status: Married  Spouse name: Not on file  . Number of children: Not on file  . Years of education: Not on file  . Highest education level: Not on file  Occupational History  . Not on file  Social Needs  . Financial resource strain: Not on file  . Food insecurity:    Worry: Not on file    Inability: Not on file  . Transportation needs:    Medical: Not on file    Non-medical: Not on file  Tobacco Use  . Smoking status: Former Smoker    Packs/day: 1.00    Years: 40.00    Pack years: 40.00    Types: Cigarettes    Last attempt to quit: 05/31/2002    Years since quitting: 15.7  . Smokeless tobacco: Never Used  Substance and Sexual Activity  . Alcohol use: No    Alcohol/week: 0.0 oz  . Drug use: No  . Sexual activity: Not on file  Lifestyle  . Physical activity:    Days per week: Not on file    Minutes per session: Not on file  . Stress: Not on file  Relationships  . Social connections:    Talks on phone: Not on file    Gets together: Not on file    Attends religious service: Not on file    Active member of club or organization: Not on file     Attends meetings of clubs or organizations: Not on file    Relationship status: Not on file  . Intimate partner violence:    Fear of current or ex partner: Not on file    Emotionally abused: Not on file    Physically abused: Not on file    Forced sexual activity: Not on file  Other Topics Concern  . Not on file  Social History Narrative  . Not on file    Family History:   The patient's family history includes Lymphoma in his sister.    ROS:  Please see the history of present illness.  All other ROS reviewed and negative.     Physical Exam/Data:   Vitals:   02/08/18 1211 02/08/18 1458 02/08/18 1505 02/08/18 1512  BP: (!) 115/96 (!) 124/96 98/71 119/82  Pulse: 87 98 (!) 35 72  Resp: (!) 23 12 18 14   Temp: 98.2 F (36.8 C)     TempSrc: Oral     SpO2: 95% 98% 97% 97%  Weight:      Height:        Intake/Output Summary (Last 24 hours) at 02/08/2018 1519 Last data filed at 02/08/2018 1318 Gross per 24 hour  Intake 0 ml  Output 525 ml  Net -525 ml   Filed Weights   02/08/18 1200  Weight: 226 lb 1.6 oz (102.6 kg)   Body mass index is 34.38 kg/m.  General:  Well nourished, well developed, obese gentleman laying in bed in no acute distress HEENT: sclera anicteric Neck: no JVD Endocrine:  No thryomegaly Vascular: No carotid bruits; distal pulses 2+ bilaterally without bruits  Cardiac:  normal S1, S2; IRIR; no murmurs, gallops, or rubs Lungs:  clear to auscultation bilaterally, no wheezing, rhonchi or rales  Abd: soft, obese, nontender, no hepatomegaly  Ext: no edema Musculoskeletal:  No deformities, BUE and BLE strength normal and equal Skin: warm and dry  Neuro:  CNs 2-12 intact, no focal abnormalities noted Psych:  Normal affect    EKG:  atrial fibrillation rate 144, with STD in anterolateral leads.  Relevant CV  Studies: Lexiscan Myoview 08/16/2015:  There was no ST segment deviation noted during stress.  The study is normal.  This is a low risk study.  The  left ventricular ejection fraction is normal (55-65%).    Laboratory Data:  ChemistryNo results for input(s): NA, K, CL, CO2, GLUCOSE, BUN, CREATININE, CALCIUM, GFRNONAA, GFRAA, ANIONGAP in the last 168 hours.  No results for input(s): PROT, ALBUMIN, AST, ALT, ALKPHOS, BILITOT in the last 168 hours. HematologyNo results for input(s): WBC, RBC, HGB, HCT, MCV, MCH, MCHC, RDW, PLT in the last 168 hours. Cardiac EnzymesNo results for input(s): TROPONINI in the last 168 hours. No results for input(s): TROPIPOC in the last 168 hours.  BNPNo results for input(s): BNP, PROBNP in the last 168 hours.  DDimer No results for input(s): DDIMER in the last 168 hours.  Radiology/Studies:  No results found.  Assessment and Plan:   1. New onset atrial fibrillation with RVR: patient presented to [facility] with left-sided chest pain with radiation of pain to his L arm starting this morning at 6am.  Also with intermittent non-exertional episodes over the past week. He denies palpitations or feeling his heart race. Possible he has been experiencing paroxysmal afib. Also with initiation of 60mg  steroid dose pack s/p 2 doses. Difficult to say when initial Afib episode started. He continues to be tachycardic to the 140s at this time.  - This patients CHA2DS2-VASc Score and unadjusted Ischemic Stroke Rate (% per year) is equal to 3.2 % stroke rate/year from a score of 3 Above score calculated as 1 point each if present [CHF, HTN, DM, Vascular=MI/PAD/Aortic Plaque, Age if 65-74, or Male] Above score calculated as 2 points each if present [Age > 75, or Stroke/TIA/TE] - Will give a diltiazem bolus now, followed by maintenance gtt to be titrated based on rate/pressures - If his BP will not tolerate titration of diltiazem, may need to consider amiodarone.  - Will check an echocardiogram - Could consider TEE/DCCV on Monday if he remains in Afib with difficult to control rates - Will check TSH  2. CP in patient  with branch vessel CAD: p/w left-sided chest pain with radiation of pain to his L arm starting this morning at 6am, not relieved with SL nitro.  Also with intermittent non-exertional episodes over the past week. Noted to have 70-80% stenosis of a small diagonal vessel on [facility] 2011 for the evaluation of an NSTEMI. He had a NST 2017 which was negative for ischemia. Likely his CP is related to his new onset atrial fibrillation, however it is difficult to determine timing of onset of afib. While symptoms are not overly concerning for ACS, possible his CP last week was related to unstable angina - Continue ASA and statin - Continue to trend trop x3 or to peak.  - If troponins remain negative, would favor NST prior to discharge once rate is better controlled.   3. HTN: BP stable - Monitor closely while on diltiazem gtt - Home amlodipine on hold  4. HLD: no recent lipids - Continue statin  5. Parkinsons Disease: diagnosed 05/2017; follows outpatient with neurology - Continue home sinemet  6. Back pain: patient reports acute onset of back pain earlier this week. He was seen by his spine doctor and started on a 60mg  steroid dose pack, of which he took 2 days worth. Possible steroids triggered onset of atrial fibrillation.  - Will hold further steroids at this time.  - Could trial flexeril if back pain becomes bothersome  Severity of Illness: The appropriate patient status for this patient is INPATIENT. Inpatient status is judged to be reasonable and necessary in order to provide the required intensity of service to ensure the patient's safety. The patient's presenting symptoms, physical exam findings, and initial radiographic and laboratory data in the context of their chronic comorbidities is felt to place them at high risk for further clinical deterioration. Furthermore, it is not anticipated that the patient will be medically stable for discharge from the hospital within 2 midnights of admission. The  following factors support the patient status of inpatient.   " The patient's presenting symptoms include chest pain. " The worrisome physical exam findings include irregular rate and rhythm on cardiac exam. " The initial radiographic and laboratory data are worrisome because of new onset atrial fibrillation with RVR. " The chronic co-morbidities include CAD, HTN, HLD, PD.   * I certify that at the point of admission it is my clinical judgment that the patient will require inpatient hospital care spanning beyond 2 midnights from the point of admission due to high intensity of service, high risk for further deterioration and high frequency of surveillance required.*    For questions or updates, please contact [place] Please consult [email or web address] for contact info under Cardiology/STEMI.    Signed, Abigail Butts, PA-C  02/08/2018 3:19 PM

## 2018-02-08 NOTE — Progress Notes (Signed)
ANTICOAGULATION CONSULT NOTE - Initial Consult  Pharmacy Consult for IV heparin Indication: atrial fibrillation  Allergies  Allergen Reactions  . Iodinated Diagnostic Agents Swelling  . Sulfa Drugs Cross Reactors Itching    Patient Measurements: Height: 5\' 8"  (172.7 cm) Weight: 226 lb 1.6 oz (102.6 kg) IBW/kg (Calculated) : 68.4 Heparin Dosing Weight: 90 kg  Vital Signs: Temp: 98.2 F (36.8 C) (07/12 1211) Temp Source: Oral (07/12 1211) BP: 115/96 (07/12 1211) Pulse Rate: David (07/12 1211)  Labs: No results for input(s): HGB, HCT, PLT, APTT, LABPROT, INR, HEPARINUNFRC, HEPRLOWMOCWT, CREATININE, CKTOTAL, CKMB, TROPONINI in the last 72 hours.  CrCl cannot be calculated (Patient's most recent lab result is older than the maximum 21 days allowed.).   Medical History: Past Medical History:  Diagnosis Date  . Coronary atherosclerosis of native coronary artery    70-80% small diagonal - medically managed, LVEF 55%  . Dermatophytosis of nail   . ED (erectile dysfunction)   . Essential hypertension, benign   . GERD (gastroesophageal reflux disease)   . GERD (gastroesophageal reflux disease)   . History of colonic polyps 10/08/2017  . Lumbago   . Mixed hyperlipidemia   . Non-ST elevation myocardial infarction (NSTEMI) ([place])    11/11  . Obesity   . Prostatitis, unspecified     Medications:  Scheduled:  . aspirin EC  81 mg Oral Daily  . carbidopa-levodopa  2 tablet Oral TID  . [START ON 02/09/2018] docusate sodium  100 mg Oral Daily  . [START ON 02/09/2018] loratadine  10 mg Oral Daily  . [START ON 02/09/2018] pantoprazole  40 mg Oral Daily  . rosuvastatin  2.5 mg Oral q1800  . [START ON 02/09/2018] Vitamin D3  1,000 Units Oral BID    Assessment: David Combs admitted with new onset afib.  Pharmacy asked to begin anticoagulation with IV heparin.  No previous hx bleeding per patient, no anticoagulation PTA.  Goal of Therapy:  Heparin level 0.3-0.7 units/ml Monitor platelets  by anticoagulation protocol: Yes   Plan:  1. Start IV heparin at rate of 1250 units/hr. 2. Check heparin level 8 hrs after gtt starts. 3. Daily heparin level and CBC. 4. F/u plans for oral anticoagulation eventually.  Marguerite Olea, [facility] Clinical Pharmacist Phone [phone]  02/08/2018 2:19 PM

## 2018-02-09 ENCOUNTER — Inpatient Hospital Stay (HOSPITAL_COMMUNITY): Payer: MEDICARE

## 2018-02-09 DIAGNOSIS — R079 Chest pain, unspecified: Secondary | ICD-10-CM

## 2018-02-09 LAB — BASIC METABOLIC PANEL
Anion gap: 10 (ref 5–15)
BUN: 17 mg/dL (ref 8–23)
CALCIUM: 8.9 mg/dL (ref 8.9–10.3)
CHLORIDE: 107 mmol/L (ref 98–111)
CO2: 25 mmol/L (ref 22–32)
Creatinine, Ser: 1.09 mg/dL (ref 0.61–1.24)
GLUCOSE: 112 mg/dL — AB (ref 70–99)
Potassium: 3.7 mmol/L (ref 3.5–5.1)
Sodium: 142 mmol/L (ref 135–145)

## 2018-02-09 LAB — CBC
HCT: 44.1 % (ref 39.0–52.0)
Hemoglobin: 14.6 g/dL (ref 13.0–17.0)
MCH: 32.8 pg (ref 26.0–34.0)
MCHC: 33.1 g/dL (ref 30.0–36.0)
MCV: 99.1 fL (ref 78.0–100.0)
PLATELETS: 184 10*3/uL (ref 150–400)
RBC: 4.45 MIL/uL (ref 4.22–5.81)
RDW: 12.1 % (ref 11.5–15.5)
WBC: 8.5 10*3/uL (ref 4.0–10.5)

## 2018-02-09 LAB — TROPONIN I: TROPONIN I: 0.03 ng/mL — AB (ref <0.03)

## 2018-02-09 LAB — HIV ANTIBODY (ROUTINE TESTING W REFLEX): HIV Screen 4th Generation wRfx: NONREACTIVE

## 2018-02-09 LAB — ECHOCARDIOGRAM COMPLETE
HEIGHTINCHES: 68 in
Weight: 3569.69 oz

## 2018-02-09 LAB — HEPARIN LEVEL (UNFRACTIONATED)
HEPARIN UNFRACTIONATED: 0.42 [IU]/mL (ref 0.30–0.70)
Heparin Unfractionated: 0.38 IU/mL (ref 0.30–0.70)

## 2018-02-09 NOTE — Progress Notes (Signed)
  Echocardiogram 2D Echocardiogram has been performed.  David Combs 02/09/2018, 10:03 AM

## 2018-02-09 NOTE — Progress Notes (Signed)
ANTICOAGULATION CONSULT NOTE - Initial Consult  Pharmacy Consult for IV heparin Indication: atrial fibrillation  Allergies  Allergen Reactions  . Iodinated Diagnostic Agents Swelling  . Sulfa Drugs Cross Reactors Itching    Patient Measurements: Height: 5\' 8"  (172.7 cm) Weight: 223 lb 1.7 oz (101.2 kg) IBW/kg (Calculated) : 68.4 Heparin Dosing Weight: 90 kg  Vital Signs: Temp: 98.2 F (36.8 C) (07/13 0700) Temp Source: Oral (07/13 0700) BP: 136/91 (07/13 0700) Pulse Rate: 77 (07/13 0700)  Labs: Recent Labs    02/08/18 1408 02/08/18 2030 02/08/18 2312 02/09/18 0226  HGB  --   --   --  14.6  HCT  --   --   --  44.1  PLT  --   --   --  184  HEPARINUNFRC  --   --  0.38 0.42  CREATININE  --   --   --  1.09  TROPONINI 0.03* 0.03*  --  0.03*    Estimated Creatinine Clearance: 72.7 mL/min (by C-G formula based on SCr of 1.09 mg/dL).   Medical History: Past Medical History:  Diagnosis Date  . Coronary atherosclerosis of native coronary artery    70-80% small diagonal - medically managed, LVEF 55%  . Dermatophytosis of nail   . ED (erectile dysfunction)   . Essential hypertension, benign   . GERD (gastroesophageal reflux disease)   . GERD (gastroesophageal reflux disease)   . History of colonic polyps 10/08/2017  . Lumbago   . Mixed hyperlipidemia   . Non-ST elevation myocardial infarction (NSTEMI) ([place])    11/11  . Obesity   . Prostatitis, unspecified     Medications:  Scheduled:  . aspirin EC  81 mg Oral Daily  . carbidopa-levodopa  2 tablet Oral TID AC  . cholecalciferol  1,000 Units Oral BID  . docusate sodium  100 mg Oral Daily  . loratadine  10 mg Oral Daily  . pantoprazole  40 mg Oral Daily  . rosuvastatin  2.5 mg Oral q1800    Assessment: 72 yo male admitted with new onset afib.  Pharmacy asked to begin anticoagulation with IV heparin.  No previous hx bleeding per patient, no anticoagulation PTA.  Heparin level remains at goal this AM.  No overt  bleeding or complications noted.  Goal of Therapy:  Heparin level 0.3-0.7 units/ml Monitor platelets by anticoagulation protocol: Yes   Plan:  1. Continue IV heparin at rate of 1250 units/hr. 2. Daily heparin level and CBC. 3. F/u plans for oral anticoagulation eventually.  Marguerite Olea, [facility] Clinical Pharmacist Phone [phone]  02/09/2018 10:30 AM

## 2018-02-09 NOTE — Progress Notes (Signed)
Progress Note  Patient Name: David Combs Date of Encounter: 02/09/2018  Primary Cardiologist: Rozann Lesches, MD   Subjective   Feeling better today rate controlled on IV diltiazem and heparin continued A. fib with a controlled ventricular response.  Inpatient Medications    Scheduled Meds: . aspirin EC  81 mg Oral Daily  . carbidopa-levodopa  2 tablet Oral TID AC  . cholecalciferol  1,000 Units Oral BID  . docusate sodium  100 mg Oral Daily  . loratadine  10 mg Oral Daily  . pantoprazole  40 mg Oral Daily  . rosuvastatin  2.5 mg Oral q1800   Continuous Infusions: . diltiazem (CARDIZEM) infusion 5 mg/hr (02/08/18 2319)  . heparin 1,250 Units/hr (02/08/18 1809)   PRN Meds: acetaminophen, nitroGLYCERIN, ondansetron (ZOFRAN) IV, polyethylene glycol, polyvinyl alcohol, traZODone   Vital Signs    Vitals:   02/09/18 0239 02/09/18 0500 02/09/18 0700 02/09/18 1100  BP: 117/72  (!) 136/91 112/81  Pulse: 84  77 85  Resp: 15  14   Temp: (!) 97.4 F (36.3 C)  98.2 F (36.8 C) 98.1 F (36.7 C)  TempSrc: Oral  Oral Oral  SpO2: 98%  96% 97%  Weight:  223 lb 1.7 oz (101.2 kg)    Height:        Intake/Output Summary (Last 24 hours) at 02/09/2018 1155 Last data filed at 02/09/2018 0241 Gross per 24 hour  Intake 547.26 ml  Output 1275 ml  Net -727.74 ml   Filed Weights   02/08/18 1200 02/09/18 0500  Weight: 226 lb 1.6 oz (102.6 kg) 223 lb 1.7 oz (101.2 kg)    Telemetry    A. fib with controlled ventricular response.- Personally Reviewed  ECG    Not performed today- Personally Reviewed  Physical Exam   GEN: No acute distress.   Neck: No JVD Cardiac:  Irregularly irregular, no murmurs, rubs, or gallops.  Respiratory: Clear to auscultation bilaterally. GI: Soft, nontender, non-distended  MS: No edema; No deformity. Neuro:  Nonfocal  Psych: Normal affect   Labs    Chemistry Recent Labs  Lab 02/09/18 0226  NA 142  K 3.7  CL 107  CO2 25  GLUCOSE 112*   BUN 17  CREATININE 1.09  CALCIUM 8.9  GFRNONAA >60  GFRAA >60  ANIONGAP 10     Hematology Recent Labs  Lab 02/09/18 0226  WBC 8.5  RBC 4.45  HGB 14.6  HCT 44.1  MCV 99.1  MCH 32.8  MCHC 33.1  RDW 12.1  PLT 184    Cardiac Enzymes Recent Labs  Lab 02/08/18 1408 02/08/18 2030 02/09/18 0226  TROPONINI 0.03* 0.03* 0.03*   No results for input(s): TROPIPOC in the last 168 hours.   BNPNo results for input(s): BNP, PROBNP in the last 168 hours.   DDimer No results for input(s): DDIMER in the last 168 hours.   Radiology    No results found.  Cardiac Studies   2D echo pending  Patient Profile     72 y.o. male with history of medically treated CAD, hypertension, hyperlipidemia, Parkinson disease and new onset A. fib with RVR.  He is currently rate controlled on IV diltiazem and heparin.  2D echo is pending.  Plan is to perform TEE guided DC cardioversion on Monday with outpatient Myoview soon thereafter.  Assessment & Plan    1: Atrial fibrillation- new onset/newly recognized with rapid ventricular response now rate controlled on IV diltiazem.  He is also on IV heparin.  This patients CHA2DS2-VASc Score and unadjusted Ischemic Stroke Rate (% per year) is equal to 3.2 % stroke rate/year from a score of 3  Above score calculated as 1 point each if present [CHF, HTN, DM, Vascular=MI/PAD/Aortic Plaque, Age if 65-74, or Male] Above score calculated as 2 points each if present [Age > 75, or Stroke/TIA/TE]  Plan for TEE guided DC cardioversion on Monday.  2: Coronary artery disease- history of CAD with cath in 2011 revealing only diagonal branch disease.  He has had frequent chest pain in the recent past requiring sublingual nitroglycerin glycerin.  He will need Myoview stress testing to further evaluate.  His troponins are negative.  3: Essential hypertension- blood pressure stable   For questions or updates, please contact [place] Please consult [email or web address]  for contact info under Cardiology/STEMI.      Signed, Quay Burow, MD  02/09/2018, 11:55 AM

## 2018-02-09 NOTE — Progress Notes (Signed)
ANTICOAGULATION CONSULT NOTE - Follow Up Consult  Pharmacy Consult for heparin Indication: atrial fibrillation  Labs: Recent Labs    02/08/18 1408 02/08/18 2030 02/08/18 2312  HEPARINUNFRC  --   --  0.38  TROPONINI 0.03* 0.03*  --      Assessment/Plan:  72yo male therapeutic on heparin with initial dosing for Afib. Will continue gtt at current rate and confirm stable with am labs.   Wynona Neat, PharmD, BCPS  02/09/2018,12:10 AM

## 2018-02-10 ENCOUNTER — Encounter (HOSPITAL_COMMUNITY): Payer: Self-pay

## 2018-02-10 ENCOUNTER — Other Ambulatory Visit: Payer: Self-pay

## 2018-02-10 LAB — CBC
HCT: 44.1 % (ref 39.0–52.0)
HCT: 47.1 % (ref 39.0–52.0)
HEMOGLOBIN: 15.5 g/dL (ref 13.0–17.0)
Hemoglobin: 14.9 g/dL (ref 13.0–17.0)
MCH: 33.1 pg (ref 26.0–34.0)
MCH: 32.7 pg (ref 26.0–34.0)
MCHC: 33.8 g/dL (ref 30.0–36.0)
MCHC: 32.9 g/dL (ref 30.0–36.0)
MCV: 98 fL (ref 78.0–100.0)
MCV: 99.4 fL (ref 78.0–100.0)
PLATELETS: 211 10*3/uL (ref 150–400)
Platelets: 163 10*3/uL (ref 150–400)
RBC: 4.5 MIL/uL (ref 4.22–5.81)
RBC: 4.74 MIL/uL (ref 4.22–5.81)
RDW: 11.9 % (ref 11.5–15.5)
RDW: 12 % (ref 11.5–15.5)
WBC: 6.9 10*3/uL (ref 4.0–10.5)
WBC: 8.1 10*3/uL (ref 4.0–10.5)

## 2018-02-10 LAB — BASIC METABOLIC PANEL
Anion gap: 7 (ref 5–15)
BUN: 16 mg/dL (ref 8–23)
CALCIUM: 9.2 mg/dL (ref 8.9–10.3)
CO2: 28 mmol/L (ref 22–32)
CREATININE: 1.03 mg/dL (ref 0.61–1.24)
Chloride: 105 mmol/L (ref 98–111)
Glucose, Bld: 104 mg/dL — ABNORMAL HIGH (ref 70–99)
Potassium: 3.7 mmol/L (ref 3.5–5.1)
SODIUM: 140 mmol/L (ref 135–145)

## 2018-02-10 LAB — HEPARIN LEVEL (UNFRACTIONATED): HEPARIN UNFRACTIONATED: 0.45 [IU]/mL (ref 0.30–0.70)

## 2018-02-10 LAB — PROTIME-INR
INR: 1.04
PROTHROMBIN TIME: 13.6 s (ref 11.4–15.2)

## 2018-02-10 MED ORDER — ASPIRIN 81 MG PO CHEW
81.0000 mg | CHEWABLE_TABLET | ORAL | Status: AC
  Administered 2018-02-11: 81 mg via ORAL
  Filled 2018-02-10: qty 1

## 2018-02-10 MED ORDER — SODIUM CHLORIDE 0.9% FLUSH
3.0000 mL | Freq: Two times a day (BID) | INTRAVENOUS | Status: DC
  Administered 2018-02-10 – 2018-02-11 (×3): 3 mL via INTRAVENOUS

## 2018-02-10 MED ORDER — SODIUM CHLORIDE 0.9 % IV SOLN
INTRAVENOUS | Status: DC
  Administered 2018-02-10 – 2018-02-12 (×3): via INTRAVENOUS

## 2018-02-10 MED ORDER — SODIUM CHLORIDE 0.9% FLUSH: 3.0000 mL | INTRAVENOUS | Status: DC | PRN

## 2018-02-10 MED ORDER — SODIUM CHLORIDE 0.9 % IV SOLN: 250.0000 mL | INTRAVENOUS | Status: DC | PRN

## 2018-02-10 NOTE — Progress Notes (Signed)
[place] for IV heparin Indication: atrial fibrillation  Allergies  Allergen Reactions  . Iodinated Diagnostic Agents Swelling  . Sulfa Drugs Cross Reactors Itching    Patient Measurements: Height: 5\' 8"  (172.7 cm) Weight: 223 lb 12.3 oz (101.5 kg) IBW/kg (Calculated) : 68.4 Heparin Dosing Weight: 90 kg  Vital Signs: Temp: 97.5 F (36.4 C) (07/14 0344) Temp Source: Oral (07/14 0344) BP: 99/64 (07/14 0600) Pulse Rate: 95 (07/14 0600)  Labs: Recent Labs    02/08/18 1408 02/08/18 2030 02/08/18 2312 02/09/18 0226 02/10/18 0351  HGB  --   --   --  14.6 14.9  HCT  --   --   --  44.1 44.1  PLT  --   --   --  184 163  HEPARINUNFRC  --   --  0.38 0.42 0.45  CREATININE  --   --   --  1.09  --   TROPONINI 0.03* 0.03*  --  0.03*  --     Estimated Creatinine Clearance: 71.7 mL/min (by C-G formula based on SCr of 1.09 mg/dL).   Medical History: Past Medical History:  Diagnosis Date  . Coronary atherosclerosis of native coronary artery    70-80% small diagonal - medically managed, LVEF 55%  . Dermatophytosis of nail   . ED (erectile dysfunction)   . Essential hypertension, benign   . GERD (gastroesophageal reflux disease)   . GERD (gastroesophageal reflux disease)   . History of colonic polyps 10/08/2017  . Lumbago   . Mixed hyperlipidemia   . Non-ST elevation myocardial infarction (NSTEMI) ([place])    11/11  . Obesity   . Prostatitis, unspecified     Medications:  Scheduled:  . aspirin EC  81 mg Oral Daily  . carbidopa-levodopa  2 tablet Oral TID AC  . cholecalciferol  1,000 Units Oral BID  . docusate sodium  100 mg Oral Daily  . loratadine  10 mg Oral Daily  . pantoprazole  40 mg Oral Daily  . rosuvastatin  2.5 mg Oral q1800    Assessment: 72 yo male admitted with new onset afib.  Pharmacy asked to begin anticoagulation with IV heparin.  No previous hx bleeding per patient, no anticoagulation PTA.  Heparin level remains  at goal this AM.  No overt bleeding or complications noted.  Planning TEE/DCCV tomorrow.  Goal of Therapy:  Heparin level 0.3-0.7 units/ml Monitor platelets by anticoagulation protocol: Yes   Plan:  1. Continue IV heparin at rate of 1250 units/hr. 2. Daily heparin level and CBC. 3. F/u plans for oral anticoagulation eventually.  Marguerite Olea, [facility] Clinical Pharmacist Phone [phone]  02/10/2018 9:46 AM

## 2018-02-10 NOTE — Progress Notes (Signed)
Progress Note  Patient Name: David Combs Date of Encounter: 02/10/2018  Primary Cardiologist: Rozann Lesches, MD   Subjective   Feeling better today rate controlled on IV diltiazem and heparin continued A. fib with a controlled ventricular response.  No chest pain or shortness of breath.  Inpatient Medications    Scheduled Meds: . aspirin EC  81 mg Oral Daily  . carbidopa-levodopa  2 tablet Oral TID AC  . cholecalciferol  1,000 Units Oral BID  . docusate sodium  100 mg Oral Daily  . loratadine  10 mg Oral Daily  . pantoprazole  40 mg Oral Daily  . rosuvastatin  2.5 mg Oral q1800   Continuous Infusions: . diltiazem (CARDIZEM) infusion 5 mg/hr (02/10/18 0344)  . heparin 1,250 Units/hr (02/10/18 0344)   PRN Meds: acetaminophen, nitroGLYCERIN, ondansetron (ZOFRAN) IV, polyethylene glycol, polyvinyl alcohol, traZODone   Vital Signs    Vitals:   02/10/18 0600 02/10/18 0800 02/10/18 1000 02/10/18 1047  BP: 99/64 108/73 112/79 108/75  Pulse: 95 70 87 65  Resp: 13 18 15 13   Temp:  98.8 F (37.1 C)  97.7 F (36.5 C)  TempSrc:  Oral  Oral  SpO2: 98%   98%  Weight:      Height:        Intake/Output Summary (Last 24 hours) at 02/10/2018 1114 Last data filed at 02/10/2018 0600 Gross per 24 hour  Intake 962.54 ml  Output 300 ml  Net 662.54 ml   Filed Weights   02/08/18 1200 02/09/18 0500 02/10/18 0416  Weight: 226 lb 1.6 oz (102.6 kg) 223 lb 1.7 oz (101.2 kg) 223 lb 12.3 oz (101.5 kg)    Telemetry    A. fib with controlled ventricular response.- Personally Reviewed  ECG    Not performed today- Personally Reviewed  Physical Exam   GEN: No acute distress.   Neck: No JVD Cardiac:  Irregularly irregular, no murmurs, rubs, or gallops.  Respiratory: Clear to auscultation bilaterally. GI: Soft, nontender, non-distended  MS: No edema; No deformity. Neuro:  Nonfocal  Psych: Normal affect   Labs    Chemistry Recent Labs  Lab 02/09/18 0226  NA 142  K 3.7    CL 107  CO2 25  GLUCOSE 112*  BUN 17  CREATININE 1.09  CALCIUM 8.9  GFRNONAA >60  GFRAA >60  ANIONGAP 10     Hematology Recent Labs  Lab 02/09/18 0226 02/10/18 0351  WBC 8.5 6.9  RBC 4.45 4.50  HGB 14.6 14.9  HCT 44.1 44.1  MCV 99.1 98.0  MCH 32.8 33.1  MCHC 33.1 33.8  RDW 12.1 11.9  PLT 184 163    Cardiac Enzymes Recent Labs  Lab 02/08/18 1408 02/08/18 2030 02/09/18 0226  TROPONINI 0.03* 0.03* 0.03*   No results for input(s): TROPIPOC in the last 168 hours.   BNPNo results for input(s): BNP, PROBNP in the last 168 hours.   DDimer No results for input(s): DDIMER in the last 168 hours.   Radiology    No results found.  Cardiac Studies   2D echo pending  Patient Profile     72 y.o. male with history of medically treated CAD, hypertension, hyperlipidemia, Parkinson disease and new onset A. fib with RVR.  He is currently rate controlled on IV diltiazem and heparin.  2D echo is pending.  Plan is to perform TEE guided DC cardioversion on Monday with outpatient Myoview soon thereafter.  Assessment & Plan    1: Atrial fibrillation- new onset/newly  recognized with rapid ventricular response now rate controlled on IV diltiazem.  He is also on IV heparin. This patients CHA2DS2-VASc Score and unadjusted Ischemic Stroke Rate (% per year) is equal to 3.2 % stroke rate/year from a score of 3  Above score calculated as 1 point each if present [CHF, HTN, DM, Vascular=MI/PAD/Aortic Plaque, Age if 65-74, or Male] Above score calculated as 2 points each if present [Age > 75, or Stroke/TIA/TE]  My initial plan was to do a TEE guided DC cardioversion tomorrow however, in light of the patient's chest pain I am concerned that he may have had progression of CAD and will perform left heart cath first to rule out significant CAD prior to cardioversion.  2: Coronary artery disease- history of CAD with cath in 2011 revealing only diagonal branch disease.  He has had frequent chest  pain in the recent past requiring sublingual nitroglycerin glycerin.  His troponins were negative.  He said no recurrent chest pain.  However, given his significant chest pain on Friday morning with left upper extremity radiation and his new onset A. fib I am concerned that this may have been ischemically mediated.  Based on this concern, I decided to proceed with diagnostic coronary angiography to define his coronary anatomy first prior to cardioversion.  3: Essential hypertension- blood pressure stable   For questions or updates, please contact [place] Please consult [email or web address] for contact info under Cardiology/STEMI.      Signed, Quay Burow, MD  02/10/2018, 11:14 AM

## 2018-02-11 ENCOUNTER — Encounter (HOSPITAL_COMMUNITY): Payer: Self-pay | Admitting: General Practice

## 2018-02-11 DIAGNOSIS — G20 Parkinson's disease: Secondary | ICD-10-CM | POA: Diagnosis present

## 2018-02-11 DIAGNOSIS — I200 Unstable angina: Secondary | ICD-10-CM | POA: Diagnosis present

## 2018-02-11 DIAGNOSIS — I2510 Atherosclerotic heart disease of native coronary artery without angina pectoris: Secondary | ICD-10-CM

## 2018-02-11 LAB — CBC
HEMATOCRIT: 44.3 % (ref 39.0–52.0)
Hemoglobin: 14.7 g/dL (ref 13.0–17.0)
MCH: 32.7 pg (ref 26.0–34.0)
MCHC: 33.2 g/dL (ref 30.0–36.0)
MCV: 98.4 fL (ref 78.0–100.0)
PLATELETS: 171 10*3/uL (ref 150–400)
RBC: 4.5 MIL/uL (ref 4.22–5.81)
RDW: 12.1 % (ref 11.5–15.5)
WBC: 6.7 10*3/uL (ref 4.0–10.5)

## 2018-02-11 LAB — HEPARIN LEVEL (UNFRACTIONATED): HEPARIN UNFRACTIONATED: 0.45 [IU]/mL (ref 0.30–0.70)

## 2018-02-11 MED ORDER — HYDROCORTISONE NA SUCCINATE PF 250 MG IJ SOLR
200.0000 mg | Freq: Once | INTRAMUSCULAR | Status: AC
  Administered 2018-02-12: 200 mg via INTRAVENOUS
  Filled 2018-02-11: qty 200

## 2018-02-11 MED ORDER — DIPHENHYDRAMINE HCL 50 MG/ML IJ SOLN
50.0000 mg | Freq: Once | INTRAMUSCULAR | Status: DC
  Filled 2018-02-11: qty 1

## 2018-02-11 MED ORDER — SODIUM CHLORIDE 0.9% FLUSH
3.0000 mL | Freq: Two times a day (BID) | INTRAVENOUS | Status: DC
  Administered 2018-02-11 (×2): 3 mL via INTRAVENOUS

## 2018-02-11 MED ORDER — METOPROLOL TARTRATE 12.5 MG HALF TABLET
12.5000 mg | ORAL_TABLET | Freq: Two times a day (BID) | ORAL | Status: DC
Start: 2018-02-11 — End: 2018-02-12
  Administered 2018-02-11 – 2018-02-12 (×2): 12.5 mg via ORAL
  Filled 2018-02-11 (×3): qty 1

## 2018-02-11 MED ORDER — HYDROCORTISONE NA SUCCINATE PF 250 MG IJ SOLR
200.0000 mg | Freq: Once | INTRAMUSCULAR | Status: AC
  Administered 2018-02-11: 200 mg via INTRAVENOUS
  Filled 2018-02-11 (×2): qty 200

## 2018-02-11 MED ORDER — SODIUM CHLORIDE 0.9 % IV SOLN: INTRAVENOUS | Status: DC

## 2018-02-11 MED ORDER — DIPHENHYDRAMINE HCL 25 MG PO CAPS
50.0000 mg | ORAL_CAPSULE | Freq: Once | ORAL | Status: DC
  Filled 2018-02-11: qty 2

## 2018-02-11 MED ORDER — DIPHENHYDRAMINE HCL 50 MG/ML IJ SOLN
50.0000 mg | INTRAMUSCULAR | Status: AC
  Administered 2018-02-12: 50 mg via INTRAVENOUS

## 2018-02-11 MED ORDER — DIPHENHYDRAMINE HCL 25 MG PO CAPS: 50.0000 mg | ORAL_CAPSULE | ORAL | Status: AC

## 2018-02-11 MED ORDER — SODIUM CHLORIDE 0.9% FLUSH: 3.0000 mL | INTRAVENOUS | Status: DC | PRN

## 2018-02-11 MED ORDER — SODIUM CHLORIDE 0.9 % IV SOLN: 250.0000 mL | INTRAVENOUS | Status: DC | PRN

## 2018-02-11 NOTE — H&P (View-Only) (Signed)
DAILY PROGRESS NOTE   Patient Name: David Combs Date of Encounter: 02/11/2018  Chief Complaint   No further chest pain  Patient Profile   David Combs is a 72 y.o. male with a history of CAD s/p NSTEMI 2011 (LHC revealed 70-80% stenosis of small diagonal; medically managed), HTN, HLD, parkinson's disease, and PTSD, who presented to [facility] with chest pain and was found to have new onset atrial fibrillation with RVR. Transferred to [facility] [place] for further management.   Subjective   No chest pain overnight. Remains in rate-controlled a-fib on IV diltiazem and IV heparin. Plan for LHC today.  Objective   Vitals:   02/11/18 0340 02/11/18 0342 02/11/18 0630 02/11/18 0818  BP:  116/72  (!) 118/98  Pulse:  79    Resp:  12    Temp:  97.6 F (36.4 C)  97.8 F (36.6 C)  TempSrc:  Oral  Oral  SpO2:  95%    Weight: 224 lb 9.6 oz (101.9 kg)  224 lb 9.6 oz (101.9 kg)   Height:        Intake/Output Summary (Last 24 hours) at 02/11/2018 0842 Last data filed at 02/11/2018 0700 Gross per 24 hour  Intake 1372.07 ml  Output -  Net 1372.07 ml   Filed Weights   02/10/18 0416 02/11/18 0340 02/11/18 0630  Weight: 223 lb 12.3 oz (101.5 kg) 224 lb 9.6 oz (101.9 kg) 224 lb 9.6 oz (101.9 kg)    Physical Exam   General appearance: alert and no distress Lungs: clear to auscultation bilaterally Heart: irregularly irregular rhythm Extremities: extremities normal, atraumatic, no cyanosis or edema Neurologic: Grossly normal  Inpatient Medications    Scheduled Meds: . aspirin EC  81 mg Oral Daily  . carbidopa-levodopa  2 tablet Oral TID AC  . cholecalciferol  1,000 Units Oral BID  . docusate sodium  100 mg Oral Daily  . loratadine  10 mg Oral Daily  . pantoprazole  40 mg Oral Daily  . rosuvastatin  2.5 mg Oral q1800  . sodium chloride flush  3 mL Intravenous Q12H    Continuous Infusions: . sodium chloride    . sodium chloride 50 mL/hr at 02/11/18 0342  . diltiazem (CARDIZEM)  infusion 5 mg/hr (02/11/18 0342)  . heparin 1,250 Units/hr (02/11/18 0437)    PRN Meds: sodium chloride, acetaminophen, nitroGLYCERIN, ondansetron (ZOFRAN) IV, polyethylene glycol, polyvinyl alcohol, sodium chloride flush, traZODone   Labs   Results for orders placed or performed during the hospital encounter of 02/08/18 (from the past 48 hour(s))  Heparin level (unfractionated)     Status: None   Collection Time: 02/10/18  3:51 AM  Result Value Ref Range   Heparin Unfractionated 0.45 0.30 - 0.70 IU/mL    Comment: (NOTE) If heparin results are below expected values, and patient dosage has  been confirmed, suggest follow up testing of antithrombin III levels. Performed at [facility], [place] [address] [place] [address]   CBC     Status: None   Collection Time: 02/10/18  3:51 AM  Result Value Ref Range   WBC 6.9 4.0 - 10.5 K/uL   RBC 4.50 4.22 - 5.81 MIL/uL   Hemoglobin 14.9 13.0 - 17.0 g/dL   HCT 44.1 39.0 - 52.0 %   MCV 98.0 78.0 - 100.0 fL   MCH 33.1 26.0 - 34.0 pg   MCHC 33.8 30.0 - 36.0 g/dL   RDW 11.9 11.5 - 15.5 %   Platelets  163 150 - 400 K/uL    Comment: Performed at [facility], [place] [address] [place], [place] [address]  Basic metabolic panel     Status: Abnormal   Collection Time: 02/10/18  4:48 PM  Result Value Ref Range   Sodium 140 135 - 145 mmol/L   Potassium 3.7 3.5 - 5.1 mmol/L   Chloride 105 98 - 111 mmol/L    Comment: Please note change in reference range.   CO2 28 22 - 32 mmol/L   Glucose, Bld 104 (H) 70 - 99 mg/dL    Comment: Please note change in reference range.   BUN 16 8 - 23 mg/dL    Comment: Please note change in reference range.   Creatinine, Ser 1.03 0.61 - 1.24 mg/dL   Calcium 9.2 8.9 - 10.3 mg/dL   GFR calc non Af Amer >60 >60 mL/min   GFR calc Af Amer >60 >60 mL/min    Comment: (NOTE) The eGFR has been calculated using the CKD EPI equation. This calculation has not been validated in all clinical  situations. eGFR's persistently <60 mL/min signify possible Chronic Kidney Disease.    Anion gap 7 5 - 15    Comment: Performed at [facility] [address], [place], [place] [address]  Protime-INR     Status: None   Collection Time: 02/10/18  4:48 PM  Result Value Ref Range   Prothrombin Time 13.6 11.4 - 15.2 seconds   INR 1.04     Comment: Performed at [facility] [address], [place] [address]  CBC     Status: None   Collection Time: 02/10/18  4:48 PM  Result Value Ref Range   WBC 8.1 4.0 - 10.5 K/uL   RBC 4.74 4.22 - 5.81 MIL/uL   Hemoglobin 15.5 13.0 - 17.0 g/dL   HCT 47.1 39.0 - 52.0 %   MCV 99.4 78.0 - 100.0 fL   MCH 32.7 26.0 - 34.0 pg   MCHC 32.9 30.0 - 36.0 g/dL   RDW 12.0 11.5 - 15.5 %   Platelets 211 150 - 400 K/uL    Comment: Performed at [facility] [address] [place], [place] [address]  Heparin level (unfractionated)     Status: None   Collection Time: 02/11/18  2:27 AM  Result Value Ref Range   Heparin Unfractionated 0.45 0.30 - 0.70 IU/mL    Comment: (NOTE) If heparin results are below expected values, and patient dosage has  been confirmed, suggest follow up testing of antithrombin III levels. Performed at [facility], [place] [address], [place] [address]   CBC     Status: None   Collection Time: 02/11/18  2:27 AM  Result Value Ref Range   WBC 6.7 4.0 - 10.5 K/uL   RBC 4.50 4.22 - 5.81 MIL/uL   Hemoglobin 14.7 13.0 - 17.0 g/dL   HCT 44.3 39.0 - 52.0 %   MCV 98.4 78.0 - 100.0 fL   MCH 32.7 26.0 - 34.0 pg   MCHC 33.2 30.0 - 36.0 g/dL   RDW 12.1 11.5 - 15.5 %   Platelets 171 150 - 400 K/uL    Comment: Performed at [facility], [place] [address] [place],  [address]    ECG   N/A  Telemetry   A-fib with CVR - Personally Reviewed  Radiology    No results found.  Cardiac Studies   N/A  Assessment  Principal Problem:   Unstable angina (HCC) Active Problems:   Coronary  atherosclerosis of native coronary artery   Essential hypertension   Precordial chest pain   Atrial flutter with rapid ventricular response (HCC)   Parkinson's disease ([place])   Plan   1. No further chest pain. Troponin flat at 0.03. Plan for [facility] today and will address a-fib subsequent to that based on findings. Rate-controlled afib currently on IV heparin. Check FLP in am and treat accordingly.  Time Spent Directly with Patient:  I have spent a total of 15 minutes with the patient reviewing hospital notes, telemetry, EKGs, labs and examining the patient as well as establishing an assessment and plan that was discussed personally with the patient.  > 50% of time was spent in direct patient care.  Length of Stay:  LOS: 3 days   Pixie Casino, MD, [facility], [place] Director of the Advanced Lipid Disorders &  Cardiovascular Risk Reduction Clinic Diplomate of the American Board of Clinical Lipidology Attending Cardiologist  Direct Dial: [phone]  Fax: [phone]  Website:  [email or web address] 02/11/2018, 8:42 AM

## 2018-02-11 NOTE — Progress Notes (Signed)
Pt converted to NSR. MD notified. EKG ordered.   [place]  Mishon Blubaugh, RN

## 2018-02-11 NOTE — Progress Notes (Addendum)
DAILY PROGRESS NOTE   Patient Name: David Combs Date of Encounter: 02/11/2018  Chief Complaint   No further chest pain  Patient Profile   David Combs is a 72 y.o. male with a history of CAD s/p NSTEMI 2011 (LHC revealed 70-80% stenosis of small diagonal; medically managed), HTN, HLD, parkinson's disease, and PTSD, who presented to [facility] with chest pain and was found to have new onset atrial fibrillation with RVR. Transferred to [facility] for further management.   Subjective   No chest pain overnight. Remains in rate-controlled a-fib on IV diltiazem and IV heparin. Plan for LHC today.  Objective   Vitals:   02/11/18 0340 02/11/18 0342 02/11/18 0630 02/11/18 0818  BP:  116/72  (!) 118/98  Pulse:  79    Resp:  12    Temp:  97.6 F (36.4 C)  97.8 F (36.6 C)  TempSrc:  Oral  Oral  SpO2:  95%    Weight: 224 lb 9.6 oz (101.9 kg)  224 lb 9.6 oz (101.9 kg)   Height:        Intake/Output Summary (Last 24 hours) at 02/11/2018 0842 Last data filed at 02/11/2018 0700 Gross per 24 hour  Intake 1372.07 ml  Output -  Net 1372.07 ml   Filed Weights   02/10/18 0416 02/11/18 0340 02/11/18 0630  Weight: 223 lb 12.3 oz (101.5 kg) 224 lb 9.6 oz (101.9 kg) 224 lb 9.6 oz (101.9 kg)    Physical Exam   General appearance: alert and no distress Lungs: clear to auscultation bilaterally Heart: irregularly irregular rhythm Extremities: extremities normal, atraumatic, no cyanosis or edema Neurologic: Grossly normal  Inpatient Medications    Scheduled Meds: . aspirin EC  81 mg Oral Daily  . carbidopa-levodopa  2 tablet Oral TID AC  . cholecalciferol  1,000 Units Oral BID  . docusate sodium  100 mg Oral Daily  . loratadine  10 mg Oral Daily  . pantoprazole  40 mg Oral Daily  . rosuvastatin  2.5 mg Oral q1800  . sodium chloride flush  3 mL Intravenous Q12H    Continuous Infusions: . sodium chloride    . sodium chloride 50 mL/hr at 02/11/18 0342  . diltiazem (CARDIZEM)  infusion 5 mg/hr (02/11/18 0342)  . heparin 1,250 Units/hr (02/11/18 0437)    PRN Meds: sodium chloride, acetaminophen, nitroGLYCERIN, ondansetron (ZOFRAN) IV, polyethylene glycol, polyvinyl alcohol, sodium chloride flush, traZODone   Labs   Results for orders placed or performed during the hospital encounter of 02/08/18 (from the past 48 hour(s))  Heparin level (unfractionated)     Status: None   Collection Time: 02/10/18  3:51 AM  Result Value Ref Range   Heparin Unfractionated 0.45 0.30 - 0.70 IU/mL    Comment: (NOTE) If heparin results are below expected values, and patient dosage has  been confirmed, suggest follow up testing of antithrombin III levels. Performed at [facility], [place] [address] [place] [address]   CBC     Status: None   Collection Time: 02/10/18  3:51 AM  Result Value Ref Range   WBC 6.9 4.0 - 10.5 K/uL   RBC 4.50 4.22 - 5.81 MIL/uL   Hemoglobin 14.9 13.0 - 17.0 g/dL   HCT 44.1 39.0 - 52.0 %   MCV 98.0 78.0 - 100.0 fL   MCH 33.1 26.0 - 34.0 pg   MCHC 33.8 30.0 - 36.0 g/dL   RDW 11.9 11.5 - 15.5 %   Platelets  163 150 - 400 K/uL    Comment: Performed at [facility], [place] [address], [place], [place] [address]  Basic metabolic panel     Status: Abnormal   Collection Time: 02/10/18  4:48 PM  Result Value Ref Range   Sodium 140 135 - 145 mmol/L   Potassium 3.7 3.5 - 5.1 mmol/L   Chloride 105 98 - 111 mmol/L    Comment: Please note change in reference range.   CO2 28 22 - 32 mmol/L   Glucose, Bld 104 (H) 70 - 99 mg/dL    Comment: Please note change in reference range.   BUN 16 8 - 23 mg/dL    Comment: Please note change in reference range.   Creatinine, Ser 1.03 0.61 - 1.24 mg/dL   Calcium 9.2 8.9 - 10.3 mg/dL   GFR calc non Af Amer >60 >60 mL/min   GFR calc Af Amer >60 >60 mL/min    Comment: (NOTE) The eGFR has been calculated using the CKD EPI equation. This calculation has not been validated in all clinical  situations. eGFR's persistently <60 mL/min signify possible Chronic Kidney Disease.    Anion gap 7 5 - 15    Comment: Performed at [facility] [address], [place], [place] [address]  Protime-INR     Status: None   Collection Time: 02/10/18  4:48 PM  Result Value Ref Range   Prothrombin Time 13.6 11.4 - 15.2 seconds   INR 1.04     Comment: Performed at [facility] [address], [place] [address]  CBC     Status: None   Collection Time: 02/10/18  4:48 PM  Result Value Ref Range   WBC 8.1 4.0 - 10.5 K/uL   RBC 4.74 4.22 - 5.81 MIL/uL   Hemoglobin 15.5 13.0 - 17.0 g/dL   HCT 47.1 39.0 - 52.0 %   MCV 99.4 78.0 - 100.0 fL   MCH 32.7 26.0 - 34.0 pg   MCHC 32.9 30.0 - 36.0 g/dL   RDW 12.0 11.5 - 15.5 %   Platelets 211 150 - 400 K/uL    Comment: Performed at [facility] [address] [place] [address] [place], [place] [address]  Heparin level (unfractionated)     Status: None   Collection Time: 02/11/18  2:27 AM  Result Value Ref Range   Heparin Unfractionated 0.45 0.30 - 0.70 IU/mL    Comment: (NOTE) If heparin results are below expected values, and patient dosage has  been confirmed, suggest follow up testing of antithrombin III levels. Performed at [facility], [place] [address] [place] [address]   CBC     Status: None   Collection Time: 02/11/18  2:27 AM  Result Value Ref Range   WBC 6.7 4.0 - 10.5 K/uL   RBC 4.50 4.22 - 5.81 MIL/uL   Hemoglobin 14.7 13.0 - 17.0 g/dL   HCT 44.3 39.0 - 52.0 %   MCV 98.4 78.0 - 100.0 fL   MCH 32.7 26.0 - 34.0 pg   MCHC 33.2 30.0 - 36.0 g/dL   RDW 12.1 11.5 - 15.5 %   Platelets 171 150 - 400 K/uL    Comment: Performed at [facility], [place] [address], [place], [place] [address]    ECG   N/A  Telemetry   A-fib with CVR - Personally Reviewed  Radiology    No results found.  Cardiac Studies   N/A  Assessment  Principal Problem:   Unstable angina (HCC) Active Problems:   Coronary  atherosclerosis of native coronary artery   Essential hypertension   Precordial chest pain   Atrial flutter with rapid ventricular response (HCC)   Parkinson's disease ([place])   Plan   1. No further chest pain. Troponin flat at 0.03. Plan for [facility] today and will address a-fib subsequent to that based on findings. Rate-controlled afib currently on IV heparin. Check FLP in am and treat accordingly.  Time Spent Directly with Patient:  I have spent a total of 15 minutes with the patient reviewing hospital notes, telemetry, EKGs, labs and examining the patient as well as establishing an assessment and plan that was discussed personally with the patient.  > 50% of time was spent in direct patient care.  Length of Stay:  LOS: 3 days   Pixie Casino, MD, [facility] [place] Director of the Advanced Lipid Disorders &  Cardiovascular Risk Reduction Clinic Diplomate of the American Board of Clinical Lipidology Attending Cardiologist  Direct Dial: [phone]  Fax: [phone]  Website:  [email or web address] [email or web address] Laduke 02/11/2018, 8:42 AM

## 2018-02-11 NOTE — Progress Notes (Signed)
Notified that cath lab could not accommodate patient due to emergencies today. [name] for diet. Keep NPO after midnight for cath tomorrow.  David Casino, MD, [facility] [place] Director of the Advanced Lipid Disorders &  Cardiovascular Risk Reduction Clinic Diplomate of the American Board of Clinical Lipidology Attending Cardiologist  Direct Dial: [phone]  Fax: [phone]  Website:  [email or web address]

## 2018-02-11 NOTE — Progress Notes (Signed)
[place] for IV heparin Indication: atrial fibrillation  Allergies  Allergen Reactions  . Iodinated Diagnostic Agents Swelling  . Sulfa Drugs Cross Reactors Itching    Patient Measurements: Height: 5\' 8"  (172.7 cm) Weight: 224 lb 9.6 oz (101.9 kg) IBW/kg (Calculated) : 68.4 Heparin Dosing Weight: 90 kg  Vital Signs: Temp: 97.6 F (36.4 C) (07/15 0342) Temp Source: Oral (07/15 0342) BP: 116/72 (07/15 0342) Pulse Rate: 79 (07/15 0342)  Labs: Recent Labs    02/08/18 1408 02/08/18 2030  02/09/18 0226 02/10/18 0351 02/10/18 1648 02/11/18 0227  HGB  --   --    < > 14.6 14.9 15.5 14.7  HCT  --   --    < > 44.1 44.1 47.1 44.3  PLT  --   --    < > 184 163 211 171  LABPROT  --   --   --   --   --  13.6  --   INR  --   --   --   --   --  1.04  --   HEPARINUNFRC  --   --    < > 0.42 0.45  --  0.45  CREATININE  --   --   --  1.09  --  1.03  --   TROPONINI 0.03* 0.03*  --  0.03*  --   --   --    < > = values in this interval not displayed.    Estimated Creatinine Clearance: 76.1 mL/min (by C-G formula based on SCr of 1.03 mg/dL).   Medical History: Past Medical History:  Diagnosis Date  . Coronary atherosclerosis of native coronary artery    70-80% small diagonal - medically managed, LVEF 55%  . Dermatophytosis of nail   . ED (erectile dysfunction)   . Essential hypertension, benign   . GERD (gastroesophageal reflux disease)   . GERD (gastroesophageal reflux disease)   . History of colonic polyps 10/08/2017  . Lumbago   . Mixed hyperlipidemia   . Non-ST elevation myocardial infarction (NSTEMI) ([place])    11/11  . Obesity   . Prostatitis, unspecified     Medications:  Scheduled:  . aspirin EC  81 mg Oral Daily  . carbidopa-levodopa  2 tablet Oral TID AC  . cholecalciferol  1,000 Units Oral BID  . docusate sodium  100 mg Oral Daily  . loratadine  10 mg Oral Daily  . pantoprazole  40 mg Oral Daily  . rosuvastatin  2.5 mg Oral  q1800  . sodium chloride flush  3 mL Intravenous Q12H    Assessment: 72 yo male admitted with new onset afib.  Pharmacy asked to begin anticoagulation with IV heparin.  No previous hx bleeding per patient, no anticoagulation PTA.  Heparin level remains at goal this AM.  No overt bleeding or complications noted.  Planning LHC soon.  Goal of Therapy:  Heparin level 0.3-0.7 units/ml Monitor platelets by anticoagulation protocol: Yes   Plan:  1. Continue IV heparin at rate of 1250 units/hr. 2. Daily heparin level and CBC. 3. F/u plans for oral anticoagulation eventually.  Marguerite Olea, [facility] Clinical Pharmacist Phone [phone]  02/11/2018 8:04 AM

## 2018-02-12 ENCOUNTER — Encounter (HOSPITAL_COMMUNITY): Payer: Self-pay | Admitting: Cardiovascular Disease

## 2018-02-12 ENCOUNTER — Encounter (HOSPITAL_COMMUNITY)
Admission: AD | Disposition: A | Discharge status: Home / Self Care | Payer: Self-pay | Source: Other Acute Inpatient Hospital | Attending: Cardiovascular Disease

## 2018-02-12 ENCOUNTER — Telehealth: Payer: Self-pay | Admitting: Cardiology

## 2018-02-12 DIAGNOSIS — G20 Parkinson's disease: Secondary | ICD-10-CM

## 2018-02-12 DIAGNOSIS — I25110 Atherosclerotic heart disease of native coronary artery with unstable angina pectoris: Secondary | ICD-10-CM

## 2018-02-12 HISTORY — PX: LEFT HEART CATH AND CORONARY ANGIOGRAPHY: CATH118249

## 2018-02-12 LAB — LIPID PANEL
CHOL/HDL RATIO: 2.6 ratio
Cholesterol: 132 mg/dL (ref 0–200)
HDL: 50 mg/dL (ref >40)
LDL CALC: 65 mg/dL (ref 0–99)
TRIGLYCERIDES: 84 mg/dL (ref <150)
VLDL: 17 mg/dL (ref 0–40)

## 2018-02-12 LAB — CBC
HEMATOCRIT: 40.2 % (ref 39.0–52.0)
HEMOGLOBIN: 13.7 g/dL (ref 13.0–17.0)
MCH: 33 pg (ref 26.0–34.0)
MCHC: 34.1 g/dL (ref 30.0–36.0)
MCV: 96.9 fL (ref 78.0–100.0)
Platelets: 174 10*3/uL (ref 150–400)
RBC: 4.15 MIL/uL — AB (ref 4.22–5.81)
RDW: 11.7 % (ref 11.5–15.5)
WBC: 7.7 10*3/uL (ref 4.0–10.5)

## 2018-02-12 LAB — HEPARIN LEVEL (UNFRACTIONATED): HEPARIN UNFRACTIONATED: 0.48 [IU]/mL (ref 0.30–0.70)

## 2018-02-12 SURGERY — LEFT HEART CATH AND CORONARY ANGIOGRAPHY
Anesthesia: LOCAL

## 2018-02-12 MED ORDER — MIDAZOLAM HCL 2 MG/2ML IJ SOLN
INTRAMUSCULAR | Status: DC | PRN
  Administered 2018-02-12: 2 mg via INTRAVENOUS

## 2018-02-12 MED ORDER — HEPARIN (PORCINE) IN NACL 1000-0.9 UT/500ML-% IV SOLN
INTRAVENOUS | Status: AC
  Filled 2018-02-12: qty 500

## 2018-02-12 MED ORDER — ACETAMINOPHEN 325 MG PO TABS: 650.0000 mg | ORAL_TABLET | ORAL | Status: DC | PRN

## 2018-02-12 MED ORDER — MIDAZOLAM HCL 2 MG/2ML IJ SOLN
INTRAMUSCULAR | Status: AC
  Filled 2018-02-12: qty 2

## 2018-02-12 MED ORDER — SODIUM CHLORIDE 0.9 % IV SOLN
INTRAVENOUS | Status: AC
  Administered 2018-02-12: 09:00:00 via INTRAVENOUS

## 2018-02-12 MED ORDER — SODIUM CHLORIDE 0.9% FLUSH: 3.0000 mL | INTRAVENOUS | Status: DC | PRN

## 2018-02-12 MED ORDER — LIDOCAINE HCL (PF) 1 % IJ SOLN
INTRAMUSCULAR | Status: DC | PRN
  Administered 2018-02-12: 2 mL

## 2018-02-12 MED ORDER — HEPARIN SODIUM (PORCINE) 1000 UNIT/ML IJ SOLN
INTRAMUSCULAR | Status: DC | PRN
  Administered 2018-02-12: 5000 [IU] via INTRAVENOUS

## 2018-02-12 MED ORDER — VERAPAMIL HCL 2.5 MG/ML IV SOLN
INTRAVENOUS | Status: DC | PRN
  Administered 2018-02-12: 08:00:00 via INTRA_ARTERIAL

## 2018-02-12 MED ORDER — METOPROLOL TARTRATE 25 MG PO TABS: 12.5000 mg | ORAL_TABLET | Freq: Two times a day (BID) | ORAL | 0 refills | Status: DC

## 2018-02-12 MED ORDER — APIXABAN 5 MG PO TABS: 5.0000 mg | ORAL_TABLET | Freq: Two times a day (BID) | ORAL | Status: DC

## 2018-02-12 MED ORDER — HEPARIN SODIUM (PORCINE) 1000 UNIT/ML IJ SOLN
INTRAMUSCULAR | Status: AC
  Filled 2018-02-12: qty 1

## 2018-02-12 MED ORDER — APIXABAN 5 MG PO TABS: 5.0000 mg | ORAL_TABLET | Freq: Two times a day (BID) | ORAL | 0 refills | Status: DC

## 2018-02-12 MED ORDER — SODIUM CHLORIDE 0.9 % IV SOLN: 250.0000 mL | INTRAVENOUS | Status: DC | PRN

## 2018-02-12 MED ORDER — FENTANYL CITRATE (PF) 100 MCG/2ML IJ SOLN
INTRAMUSCULAR | Status: AC
  Filled 2018-02-12: qty 2

## 2018-02-12 MED ORDER — IOPAMIDOL (ISOVUE-370) INJECTION 76%
INTRAVENOUS | Status: AC
  Filled 2018-02-12: qty 100

## 2018-02-12 MED ORDER — SODIUM CHLORIDE 0.9% FLUSH: 3.0000 mL | Freq: Two times a day (BID) | INTRAVENOUS | Status: DC

## 2018-02-12 MED ORDER — DIPHENHYDRAMINE HCL 50 MG/ML IJ SOLN
INTRAMUSCULAR | Status: AC
  Filled 2018-02-12: qty 1

## 2018-02-12 MED ORDER — METOPROLOL TARTRATE 25 MG PO TABS: 12.5000 mg | ORAL_TABLET | Freq: Two times a day (BID) | ORAL | 3 refills | Status: DC

## 2018-02-12 MED ORDER — APIXABAN 5 MG PO TABS
5.0000 mg | ORAL_TABLET | Freq: Two times a day (BID) | ORAL | 3 refills | Status: DC
Start: 2018-02-12 — End: 2018-03-04

## 2018-02-12 MED ORDER — ASPIRIN 81 MG PO CHEW: 81.0000 mg | CHEWABLE_TABLET | Freq: Every day | ORAL | Status: DC

## 2018-02-12 MED ORDER — HEPARIN (PORCINE) IN NACL 1000-0.9 UT/500ML-% IV SOLN
INTRAVENOUS | Status: DC | PRN
  Administered 2018-02-12: 500 mL

## 2018-02-12 MED ORDER — VERAPAMIL HCL 2.5 MG/ML IV SOLN
INTRAVENOUS | Status: AC
  Filled 2018-02-12: qty 2

## 2018-02-12 MED ORDER — IOPAMIDOL (ISOVUE-370) INJECTION 76%
INTRAVENOUS | Status: DC | PRN
  Administered 2018-02-12: 90 mL via INTRA_ARTERIAL

## 2018-02-12 MED ORDER — HEPARIN SODIUM (PORCINE) 5000 UNIT/ML IJ SOLN: 5000.0000 [IU] | Freq: Three times a day (TID) | INTRAMUSCULAR | Status: DC

## 2018-02-12 MED ORDER — FENTANYL CITRATE (PF) 100 MCG/2ML IJ SOLN
INTRAMUSCULAR | Status: DC | PRN
  Administered 2018-02-12 (×2): 25 ug via INTRAVENOUS

## 2018-02-12 MED ORDER — ONDANSETRON HCL 4 MG/2ML IJ SOLN: 4.0000 mg | Freq: Four times a day (QID) | INTRAMUSCULAR | Status: DC | PRN

## 2018-02-12 SURGICAL SUPPLY — 10 items
CATH INFINITI JR4 5F (CATHETERS) ×2 IMPLANT
CATH OPTITORQUE TIG 4.0 5F (CATHETERS) ×2 IMPLANT
DEVICE RAD COMP TR BAND LRG (VASCULAR PRODUCTS) ×2 IMPLANT
GLIDESHEATH SLEND SS 6F .021 (SHEATH) ×2 IMPLANT
GUIDEWIRE INQWIRE 1.5J.035X260 (WIRE) ×1 IMPLANT
INQWIRE 1.5J .035X260CM (WIRE) ×2
KIT HEART LEFT (KITS) ×2 IMPLANT
PACK CARDIAC CATHETERIZATION (CUSTOM PROCEDURE TRAY) ×2 IMPLANT
TRANSDUCER W/STOPCOCK (MISCELLANEOUS) ×2 IMPLANT
TUBING CIL FLEX 10 FLL-RA (TUBING) ×2 IMPLANT

## 2018-02-12 NOTE — Telephone Encounter (Signed)
TOC patient d/c [facility] 02-12-2018

## 2018-02-12 NOTE — Discharge Summary (Signed)
Discharge Summary    Patient ID: David Combs,  MRN: [id number], DOB/AGE: [birth year] 72 y.o.  Admit date: 02/08/2018 Discharge date: 02/12/2018  Primary Care Provider: Glenda Chroman Primary Cardiologist: Rozann Lesches, MD  Discharge Diagnoses    Principal Problem:   Unstable angina [facility]) Active Problems:   Coronary atherosclerosis of native coronary artery   Essential hypertension   Precordial chest pain   Atrial flutter with rapid ventricular response (HCC)   Parkinson's disease ([place])   Allergies Allergies  Allergen Reactions  . Iodinated Diagnostic Agents Swelling  . Sulfa Drugs Cross Reactors Itching    Diagnostic Studies/Procedures    Left heart catheterization 02/12/18:  Prox RCA to Mid RCA lesion is 20% stenosed.  Ost 2nd Mrg lesion is 50% stenosed.  Ost 1st Diag to 1st Diag lesion is 40% stenosed.  Prox LAD lesion is 20% stenosed.   Evidence for mild coronary calcification in all coronary arteries with 20% smooth LAD stenosis, 40% mid first diagonal stenosis; 50% proximal narrowing in the circumflex marginal branch, and large dominant RCA with smooth 20% mid stenosis.  LVEDP 19 mmHg.  RECOMMENDATION: Medical therapy for CAD.  The patient had converted to sinus rhythm; recommend to initiate oral anticoagulation of choice on 02/13/18 with ASA 81 mg.  Echocardiogram 02/09/18: Study Conclusions  - Left ventricle: The cavity size was normal. Wall thickness was   normal. Systolic function was vigorous. The estimated ejection   fraction was in the range of 65% to 70%. Wall motion was normal;   there were no regional wall motion abnormalities. _____________   History of Present Illness     [name] was awoken from sleep at 6am on the morning of 02/08/18 with left sided chest pressure with radiation of pain to his left arm. He reports taking 3 SL nitro at home without relief of symptoms. Given persistence, he activated EMS and was taken to [facility] for [facility] evaluation. There he was found to be in atrial fibrillation with rate up to the 140s. He was given IV labetalol and diltiazem in an effort to control his rate. He was given ASA and an additional SL nitro with some improvement in his chest pain but did not completely resolve it. And he was given IVF to assist with intermittent hypotension. Patient requested transfer to [facility] for further care.   On further questioning, he reports having intermittent non-exertional chest pressure for the past week. Generally 1 episode a day which last for 2-3 minutes at a time and resolves spontaneously. He denies associated diaphoresis, dizziness, lightheadedness, syncope, nausea, or vomiting with any of his chest pressure episodes. He denies the sensation of his heart racing, palpitations, or fluttering in his chest. He continues to have 3/10 chest and L arm pain. He reports presenting to his spine doctor 02/05/18 with complaints of back pain and was prescribed a 60mg  steroid dose pack, of which he has taken 2 doses with some improvement in his back pain. He also reports constipation with last BM 3 days ago. He denies recent falls, fevers, URIs, hematuria, hematochezia, or melena.   He was last seen by Dr. Domenic Polite 05/2017 in follow-up of recent admission for chest pain. He was recommended to increase his amlodipine to 10mg  daily, however reported feeling worse, so lowered his dose back to 5mg  daily. He was without CP complaints at that time and was recommended to continue his current medications and follow-up in 6 months. His last ischemic evaluation was  a NST in 2017 which was without ischemia. [place] 2011 revealed 70-80% stenosis of a small diagonal vessel which was medically managed; EF 55-60% at that time.   [facility] course: Initially hypertensive, followed by episodes of hypotension, Tachycardic to the 130s-140s at presentation, improved to 110s, otherwise VSS. Labs notable for  electrolytes wnl, Cr 0.82, Hgb 16.1, PLT 220, Pro-BNP 308, Trop <0.01 x2. EKG with atrial fibrillation rate 144, with STD in anterolateral leads. CXR with no acute findings. He was transferred to [facility] for further care.      Hospital Course     Consultants: None   1. New onset atrial fibrillation: found to be in atrial fibrillation with RVR on presentation to [facility]. He had recently been started on a steroid dose pack for a flare up of his back pain. Possible this triggered his afib. His rate was ultimately controlled with IV diltiazem followed by conversion to NSR. His CHA2DS2-VASc Score was 3 (HTN, Vasc, age 71-74). He was initially maintained on IV heparin until procedures were completed, then transitioned to eliquis at discharge. Echo with EF 65-70%. TSH wnl - Continue metoprolol 12.5mg   BID - Continue eliquis 5mg  BID  2. CP in patient with branch vessel CAD: p/w left-sided chest pain with radiation of pain to his L arm starting this morning at 6am, not relieved with SL nitro.  Also with intermittent non-exertional episodes. Troponins 0.03 x3. EKG without ischemic changes. Given concern for unstable angina he underwent a LHC 02/12/18 which revealed mild-moderate non-obstructive CAD (details above). He was recommended to continue ASA and statin therapy. Right radial cath site was without significant hematoma or ecchymosis at the time of discharge. - Continue aspirin and statin  3. HTN: BP stable throughout hospitalization. Home amlodipine was discontinued. Started on metoprolol 12.5 mg BID  - Continue metoprolol  4. HLD: LDL 65; at goal of <70 - Continue statin  5. Parkinsons Disease: diagnosed 05/2017; follows outpatient with neurology - Continue home sinemet  _____________  Discharge Vitals Blood pressure 112/68, pulse 70, temperature (!) 97.4 F (36.3 C), temperature source Oral, resp. rate 13, height 5\' 8"  (1.727 m), weight 226 lb 1.6 oz (102.6 kg), SpO2 96 %.  Filed  Weights   02/11/18 0340 02/11/18 0630 02/12/18 0351  Weight: 224 lb 9.6 oz (101.9 kg) 224 lb 9.6 oz (101.9 kg) 226 lb 1.6 oz (102.6 kg)    Labs & Radiologic Studies    CBC Recent Labs    02/11/18 0227 02/12/18 0344  WBC 6.7 7.7  HGB 14.7 13.7  HCT 44.3 40.2  MCV 98.4 96.9  PLT 171 025   Basic Metabolic Panel Recent Labs    02/10/18 1648  NA 140  K 3.7  CL 105  CO2 28  GLUCOSE 104*  BUN 16  CREATININE 1.03  CALCIUM 9.2   Liver Function Tests No results for input(s): AST, ALT, ALKPHOS, BILITOT, PROT, ALBUMIN in the last 72 hours. No results for input(s): LIPASE, AMYLASE in the last 72 hours. Cardiac Enzymes No results for input(s): CKTOTAL, CKMB, CKMBINDEX, TROPONINI in the last 72 hours. BNP Invalid input(s): POCBNP D-Dimer No results for input(s): DDIMER in the last 72 hours. Hemoglobin A1C No results for input(s): HGBA1C in the last 72 hours. Fasting Lipid Panel Recent Labs    02/12/18 0344  CHOL 132  HDL 50  LDLCALC 65  TRIG 84  CHOLHDL 2.6   Thyroid Function Tests No results for input(s): TSH, T4TOTAL, T3FREE, THYROIDAB in the last  72 hours.  Invalid input(s): FREET3 _____________  No results found. Disposition   Patient was seen and examined by Dr. Debara Pickett who deemed patient as stable for discharge. Follow-up has been arranged. Discharge medications as listed below.   Follow-up Plans & Appointments    Follow-up Information    Imogene Burn, PA-C Follow up on 03/04/2018.   Specialty:  Cardiology Why:  Please arrive 15 minutes early for your 1:00pm appointment Contact information: [place] [address] [phone]          Discharge Instructions    Diet - low sodium heart healthy   Complete by:  As directed    Increase activity slowly   Complete by:  As directed       Discharge Medications   Allergies as of 02/12/2018      Reactions   Iodinated Diagnostic Agents Swelling   Sulfa Drugs Cross Reactors Itching       Medication List    STOP taking these medications   amLODipine 5 MG tablet Commonly known as:  NORVASC   predniSONE 10 MG tablet Commonly known as:  DELTASONE     TAKE these medications   apixaban 5 MG Tabs tablet Commonly known as:  ELIQUIS Take 1 tablet (5 mg total) by mouth 2 (two) times daily.   apixaban 5 MG Tabs tablet Commonly known as:  ELIQUIS Take 1 tablet (5 mg total) by mouth 2 (two) times daily. Start taking on:  02/13/2018   aspirin EC 81 MG tablet Take 1 tablet (81 mg total) by mouth daily.   carbidopa-levodopa 25-100 MG tablet Commonly known as:  SINEMET IR Take 2 tablets by mouth 3 (three) times daily.   carboxymethylcellulose 0.5 % Soln Commonly known as:  REFRESH PLUS Place 1 drop into both eyes 3 (three) times daily as needed (for dry eyes).   docusate sodium 100 MG capsule Commonly known as:  COLACE Take 100 mg by mouth daily.   DULoxetine 60 MG capsule Commonly known as:  CYMBALTA Take 120 mg by mouth every morning.   Fish Oil 1200 MG Caps Take 1,200 mg by mouth 2 (two) times daily.   glucosamine-chondroitin 500-400 MG tablet Take 1 tablet by mouth 2 (two) times daily.   loratadine 10 MG tablet Commonly known as:  CLARITIN Take 10 mg by mouth daily.   memantine 10 MG tablet Commonly known as:  NAMENDA Take 10 mg by mouth 2 (two) times daily.   metoprolol tartrate 25 MG tablet Commonly known as:  LOPRESSOR Take 0.5 tablets (12.5 mg total) by mouth 2 (two) times daily.   metoprolol tartrate 25 MG tablet Commonly known as:  LOPRESSOR Take 0.5 tablets (12.5 mg total) by mouth 2 (two) times daily.   nitroGLYCERIN 0.4 MG SL tablet Commonly known as:  NITROSTAT Place 1 tablet (0.4 mg total) under the tongue every 5 (five) minutes as needed for chest pain.   omeprazole 20 MG capsule Commonly known as:  PRILOSEC Take 20 mg by mouth 2 (two) times daily before a meal.   polyethylene glycol powder powder Commonly known as:   GLYCOLAX/MIRALAX Take 8.5 g by mouth daily as needed. What changed:    how much to take  reasons to take this   rosuvastatin 5 MG tablet Commonly known as:  CRESTOR Take 2.5 mg by mouth daily.   traZODone 100 MG tablet Commonly known as:  DESYREL Take 100 mg by mouth at bedtime as needed for sleep.  TYLENOL 325 MG tablet Generic drug:  acetaminophen Take 650 mg by mouth every 6 (six) hours as needed for moderate pain or headache.   Vitamin D3 1000 units Caps Take 1,000 Units by mouth daily.        Outstanding Labs/Studies   None  Duration of Discharge Encounter   Greater than 30 minutes including physician time.  Signed, Abigail Butts PA-C 02/12/2018, 4:17 PM

## 2018-02-12 NOTE — Interval H&P Note (Signed)
Cath Lab Visit (complete for each Cath Lab visit)  Clinical Evaluation Leading to the Procedure:   ACS: No.  Non-ACS:    Anginal Classification: CCS IV  Anti-ischemic medical therapy: Minimal Therapy (1 class of medications)  Non-Invasive Test Results: No non-invasive testing performed  Prior CABG: No previous CABG      History and Physical Interval Note:  02/12/2018 7:37 AM  David Combs  has presented today for surgery, with the diagnosis of unstable angina  The various methods of treatment have been discussed with the patient and family. After consideration of risks, benefits and other options for treatment, the patient has consented to  Procedure(s): LEFT HEART CATH AND CORONARY ANGIOGRAPHY (N/A) as a surgical intervention .  The patient's history has been reviewed, patient examined, no change in status, stable for surgery.  I have reviewed the patient's chart and labs.  Questions were answered to the patient's satisfaction.     Shelva Majestic

## 2018-02-12 NOTE — Care Management Note (Addendum)
Case Management Note  Patient Details  Name: David Combs MRN: [id number] Date of Birth: 24-Dec-1945  Subjective/Objective:    Pt admitted with CP              Action/Plan:  PTA Independent from home with wife.  Pt has active insurance and informed CM that he also has [facility] benefits - wife informed CM that she informed [facility] of admit.   Pt informed CM that he has a PCP at a local office in his home town and also has PCP at [facility].  CM also informed [facility] of admit.  CM provided free 30 day Eliquis card to pt, pt informed CM that he plans to utilize [facility] for Eliquis.  Pt assured CM that he will hand deliver paper prescription to [place] next week to ensure he is able to have refill before free supply runs out  Expected Discharge Date:                  Expected Discharge Plan:  Home/Self Care  In-House Referral:     Discharge planning Services  CM Consult  Post Acute Care Choice:    Choice offered to:     DME Arranged:    DME Agency:     HH Arranged:    [place] Agency:     Status of Service:  In process, will continue to follow  If discussed at Long Length of Stay Meetings, dates discussed:    Additional Comments: CM submitted benefit check and no coverage was determined for Eliquis.  CM contacted pharmacy of choice in [place] and confirmed that they can fill prescription today and that they will accept free 30 card.  Pharmacist successfully ran free 30 day card information via phone (CM provided necessary information from card) and confirmed pt would owe $0 today for Eliquis.  CM spoke with Olivia Mackie at [facility] sent primary care physician notice to inform of admit and that pt will discharge home on Eliquis.   VA not requiring any documentation from [facility] at this time.  [place] liaison confirmed that pt will be able to get Elqiuis prescription filled at [facility] once providing hardcopy prescription (pt confirmed he has received the hard copy prescription from [facility] attending).  Pt already has appt  set up with [facility] for the week of 02/18/18. Maryclare Labrador, RN 02/12/2018, 2:21 PM

## 2018-02-12 NOTE — Progress Notes (Signed)
IV's removed; educated on d/c instructions; given prescriptions; belongings collected; wife at bedside (ride home); will wheel out once ready.   [place]  Dagmawi Venable, RN

## 2018-02-12 NOTE — Discharge Instructions (Signed)

## 2018-02-12 NOTE — Progress Notes (Signed)
DAILY PROGRESS NOTE   Patient Name: David Combs Date of Encounter: 02/12/2018  Chief Complaint   No complaints  Patient Profile   David Combs is a 72 y.o. male with a history of CAD s/p NSTEMI 2011 (LHC revealed 70-80% stenosis of small diagonal; medically managed), HTN, HLD, parkinson's disease, and PTSD, who presented to [facility] with chest pain and was found to have new onset atrial fibrillation with RVR. Transferred to [facility] for further management.   Subjective   No further chest pain overnight. Had LHC today which showed mild to moderate, non-obstructive CAD. Medical therapy is recommended.  Objective   Vitals:   02/12/18 1100 02/12/18 1115 02/12/18 1130 02/12/18 1145  BP: 111/66 104/68 109/69 112/68  Pulse:      Resp: _0 Temp:      TempSrc:      SpO2:      Weight:      Height:        Intake/Output Summary (Last 24 hours) at 02/12/2018 1220 Last data filed at 02/12/2018 1200 Gross per 24 hour  Intake 2554.4 ml  Output 1025 ml  Net 1529.4 ml   Filed Weights   02/11/18 0340 02/11/18 0630 02/12/18 0351  Weight: 224 lb 9.6 oz (101.9 kg) 224 lb 9.6 oz (101.9 kg) 226 lb 1.6 oz (102.6 kg)    Physical Exam   General appearance: alert and no distress Lungs: clear to auscultation bilaterally Heart: regular rate and rhythm, S1, S2 normal, no murmur, click, rub or gallop Extremities: extremities normal, atraumatic, no cyanosis or edema Neurologic: Grossly normal  Inpatient Medications    Scheduled Meds: . aspirin EC  81 mg Oral Daily  . carbidopa-levodopa  2 tablet Oral TID AC  . cholecalciferol  1,000 Units Oral BID  . docusate sodium  100 mg Oral Daily  . [START ON 02/13/2018] heparin  5,000 Units Subcutaneous Q8H  . loratadine  10 mg Oral Daily  . metoprolol tartrate  12.5 mg Oral BID  . pantoprazole  40 mg Oral Daily  . rosuvastatin  2.5 mg Oral q1800  . sodium chloride flush  3 mL Intravenous Q12H    Continuous Infusions: . sodium  chloride 150 mL/hr at 02/12/18 0846  . sodium chloride      PRN Meds: sodium chloride, acetaminophen, nitroGLYCERIN, ondansetron (ZOFRAN) IV, polyethylene glycol, polyvinyl alcohol, sodium chloride flush, traZODone   Labs   Results for orders placed or performed during the hospital encounter of 02/08/18 (from the past 48 hour(s))  Basic metabolic panel     Status: Abnormal   Collection Time: 02/10/18  4:48 PM  Result Value Ref Range   Sodium 140 135 - 145 mmol/L   Potassium 3.7 3.5 - 5.1 mmol/L   Chloride 105 98 - 111 mmol/L    Comment: Please note change in reference range.   CO2 28 22 - 32 mmol/L   Glucose, Bld 104 (H) 70 - 99 mg/dL    Comment: Please note change in reference range.   BUN 16 8 - 23 mg/dL    Comment: Please note change in reference range.   Creatinine, Ser 1.03 0.61 - 1.24 mg/dL   Calcium 9.2 8.9 - 10.3 mg/dL   GFR calc non Af Amer >60 >60 mL/min   GFR calc Af Amer >60 >60 mL/min    Comment: (NOTE) The eGFR has been calculated using the CKD EPI equation. This calculation has not been validated in all clinical situations.  eGFR's persistently <60 mL/min signify possible Chronic Kidney Disease.    Anion gap 7 5 - 15    Comment: Performed at [facility] [address] [place], [place] [address]  Protime-INR     Status: None   Collection Time: 02/10/18  4:48 PM  Result Value Ref Range   Prothrombin Time 13.6 11.4 - 15.2 seconds   INR 1.04     Comment: Performed at [facility] [address] [place] [address]  CBC     Status: None   Collection Time: 02/10/18  4:48 PM  Result Value Ref Range   WBC 8.1 4.0 - 10.5 K/uL   RBC 4.74 4.22 - 5.81 MIL/uL   Hemoglobin 15.5 13.0 - 17.0 g/dL   HCT 47.1 39.0 - 52.0 %   MCV 99.4 78.0 - 100.0 fL   MCH 32.7 26.0 - 34.0 pg   MCHC 32.9 30.0 - 36.0 g/dL   RDW 12.0 11.5 - 15.5 %   Platelets 211 150 - 400 K/uL    Comment: Performed at [facility] [address], [place], [place] [address]    Heparin level (unfractionated)     Status: None   Collection Time: 02/11/18  2:27 AM  Result Value Ref Range   Heparin Unfractionated 0.45 0.30 - 0.70 IU/mL    Comment: (NOTE) If heparin results are below expected values, and patient dosage has  been confirmed, suggest follow up testing of antithrombin III levels. Performed at [facility], [place] [address] [place] [address]   CBC     Status: None   Collection Time: 02/11/18  2:27 AM  Result Value Ref Range   WBC 6.7 4.0 - 10.5 K/uL   RBC 4.50 4.22 - 5.81 MIL/uL   Hemoglobin 14.7 13.0 - 17.0 g/dL   HCT 44.3 39.0 - 52.0 %   MCV 98.4 78.0 - 100.0 fL   MCH 32.7 26.0 - 34.0 pg   MCHC 33.2 30.0 - 36.0 g/dL   RDW 12.1 11.5 - 15.5 %   Platelets 171 150 - 400 K/uL    Comment: Performed at [facility], [place] [address] [place], [place] [address]  Heparin level (unfractionated)     Status: None   Collection Time: 02/12/18  3:44 AM  Result Value Ref Range   Heparin Unfractionated 0.48 0.30 - 0.70 IU/mL    Comment: (NOTE) If heparin results are below expected values, and patient dosage has  been confirmed, suggest follow up testing of antithrombin III levels. Performed at [facility], [place] [address] [place], [place] [address]   CBC     Status: Abnormal   Collection Time: 02/12/18  3:44 AM  Result Value Ref Range   WBC 7.7 4.0 - 10.5 K/uL   RBC 4.15 (L) 4.22 - 5.81 MIL/uL   Hemoglobin 13.7 13.0 - 17.0 g/dL   HCT 40.2 39.0 - 52.0 %   MCV 96.9 78.0 - 100.0 fL   MCH 33.0 26.0 - 34.0 pg   MCHC 34.1 30.0 - 36.0 g/dL   RDW 11.7 11.5 - 15.5 %   Platelets 174 150 - 400 K/uL    Comment: Performed at [facility], [place] [address] [place], [place] [address]  Lipid panel     Status: None   Collection Time: 02/12/18  3:44 AM  Result Value Ref Range   Cholesterol 132 0 - 200 mg/dL   Triglycerides 84 <  150 mg/dL   HDL 50 >40 mg/dL   Total CHOL/HDL Ratio 2.6 RATIO   VLDL 17 0 - 40 mg/dL   LDL Cholesterol 65  0 - 99 mg/dL    Comment:        Total Cholesterol/HDL:CHD Risk Coronary Heart Disease Risk Table                     Men   Women  1/2 Average Risk   3.4   3.3  Average Risk       5.0   4.4  2 X Average Risk   9.6   7.1  3 X Average Risk  23.4   11.0        Use the calculated Patient Ratio above and the CHD Risk Table to determine the patient's CHD Risk.        ATP III CLASSIFICATION (LDL):  <100     mg/dL   Optimal  100-129  mg/dL   Near or Above                    Optimal  130-159  mg/dL   Borderline  160-189  mg/dL   High  >190     mg/dL   Very High Performed at [facility] [address] [place], [place] [address]     ECG   N/A  Telemetry   Sinus rhythm - Personally Reviewed  Radiology    No results found.  Cardiac Studies   N/A  Assessment   Principal Problem:   Unstable angina (HCC) Active Problems:   Coronary atherosclerosis of native coronary artery   Essential hypertension   Precordial chest pain   Atrial flutter with rapid ventricular response (HCC)   Parkinson's disease ([place])   Plan   Chest pain has resolved. Cath showed mild to moderate non-obstructive CAD. Will plan aggressive secondary prevention. Start Eliquis tomorrow for a-fib. Continue low dose aspirin. [place] for d/c home today. Will arrange for follow-up with Dr. Domenic Polite.  Time Spent Directly with Patient:  I have spent a total of 15 minutes with the patient reviewing hospital notes, telemetry, EKGs, labs and examining the patient as well as establishing an assessment and plan that was discussed personally with the patient.  > 50% of time was spent in direct patient care.  Length of Stay:  LOS: 4 days   Pixie Casino, MD, [facility], [place] Director of the Advanced Lipid Disorders &  Cardiovascular Risk Reduction Clinic Diplomate of the American Board of Clinical Lipidology Attending Cardiologist  Direct Dial: [phone]  Fax:  [phone]  Website:  [email or web address] 02/12/2018, 12:20 PM

## 2018-02-13 NOTE — Telephone Encounter (Signed)
Patient contacted regarding discharge from [facility] on 02/12/18.  Patient understands to follow up with provider Ermalinda Barrios on Monday, 03/04/18 at 1:00 pm at [facility]. Patient understands his discharge instructions. Patient understands his medications and regimen. Patient understands to bring all of his medications to this visit.  No c/o chest pain, dizziness or sob. Patient said that he is trying to get his prescription through the [facility] and that he was working on that today.

## 2018-02-19 MED FILL — Diltiazem HCl 100 MG/100ML in Dextrose 5% IV Soln (1 MG/ML): INTRAVENOUS | Qty: 100 | Status: AC

## 2018-02-27 ENCOUNTER — Telehealth: Payer: Self-pay

## 2018-02-27 NOTE — Telephone Encounter (Signed)
Patients wife notified

## 2018-02-27 NOTE — Telephone Encounter (Signed)
I reviewed the chart.  His cardiac catheterization was overall reassuring without obstructive stenoses and recommendation for medical therapy.  I would wonder given the temporal correlation with symptoms and taking Sinemet whether this is a medication side effect.  He needs to communicate this with his neurologist who is prescribing and following this medicine, and I would suggest that he may want to hold it for now to see if symptoms abate.

## 2018-02-27 NOTE — Progress Notes (Signed)
Cardiology Office Note    Date:  03/04/2018   ID:  David Combs, DOB 10-21-1945, MRN [id number]  PCP:  Glenda Chroman, MD  Cardiologist: Rozann Lesches, MD  No chief complaint on file.   History of Present Illness:  David Combs is a 72 y.o. male with history of cath in 2011 with 70 to 80% stenosis of a small diagonal managed medically LVEF 55 to 60% at that time.  Also has hypertension, HLD, Parkinson's disease.  Patient was transferred from [facility] to [facility] for evaluation of chest pain and rapid atrial fibrillation.  He had recently been started on a steroid Dosepak for back pain which could possibly trigger the atrial fibrillation.  He was treated with IV diltiazem and converted to normal sinus rhythm.  Chads2vASC equals 3 and he was started on Eliquis.  2D echo LVEF 65 to 70%.  Cardiac catheterization 02/12/2018 revealed mild to moderate nonobstructive CAD medical therapy recommended.  There are several phone notes from 02/27/2018 when patient called in complaining of variety of symptoms including shortness of breath weakness and chest pain, nausea shortly after taking Sinemet.  Patient comes in today accompanied by his wife.  They stopped Cymbalta and decreased Sinemet and he is feeling better.  No further chest pain.  Denies any palpitations.  Says he has trouble remembering to take the Eliquis and is wondering if he needs to stay on it long-term.  Past Medical History:  Diagnosis Date  . Agent orange exposure    "on 100% disability for this now" (02/11/2018)  . Anxiety   . Arthritis    "all over my body" (02/11/2018)  . Chronic lower back pain   . Coronary atherosclerosis of native coronary artery    70-80% small diagonal - medically managed, LVEF 55%  . Depression   . Dermatophytosis of nail   . ED (erectile dysfunction)   . Essential hypertension, benign   . GERD (gastroesophageal reflux disease)   . GERD (gastroesophageal reflux disease)   . History of colonic  polyps 10/08/2017  . Lumbago   . Mixed hyperlipidemia   . Non-ST elevation myocardial infarction (NSTEMI) ([place]) 05/2010  . Obesity   . Parkinson disease ([place])    "dx'd 06/2018"  . Pneumonia    "once" (02/11/2018)  . Prostatitis, unspecified   . Sleep apnea    "have machine; can't wear it" (02/11/2018)    Past Surgical History:  Procedure Laterality Date  . ANTERIOR CERVICAL DECOMP/DISCECTOMY FUSION    . APPENDECTOMY    . BACK SURGERY    . CARDIAC CATHETERIZATION  X 3  . CATARACT EXTRACTION W/ INTRAOCULAR LENS  IMPLANT, BILATERAL Bilateral   . CHOLECYSTECTOMY OPEN  05/2014   rt upper quadrant pain   . COLONOSCOPY N/A 11/29/2017   Procedure: COLONOSCOPY;  Surgeon: Rogene Houston, MD;  [facility];  Service: Endoscopy;  Laterality: N/A;  8:30  . ESOPHAGOGASTRODUODENOSCOPY N/A 12/02/2014   Procedure: ESOPHAGOGASTRODUODENOSCOPY (EGD);  Surgeon: Rogene Houston, MD;  Location: [facility];  Service: Endoscopy;  Laterality: N/A;  200  . FRACTURE SURGERY    . KNEE ARTHROSCOPY Right   . LEFT HEART CATH AND CORONARY ANGIOGRAPHY N/A 02/12/2018   Procedure: LEFT HEART CATH AND CORONARY ANGIOGRAPHY;  Surgeon: Troy Sine, MD;  Location: [facility] CV LAB;  Service: Cardiovascular;  Laterality: N/A;  . LUMBAR [place]    . POLYPECTOMY  11/29/2017   Procedure: POLYPECTOMY;  Surgeon: Rogene Houston,  MD;  Location: [facility];  Service: Endoscopy;;  transverse  . TONSILLECTOMY  1958  . WRIST FRACTURE SURGERY Left 1994    Current Medications: Current Meds  Medication Sig  . acetaminophen (TYLENOL) 325 MG tablet Take 650 mg by mouth every 6 (six) hours as needed for moderate pain or headache.   Marland Kitchen apixaban (ELIQUIS) 5 MG TABS tablet Take 1 tablet (5 mg total) by mouth 2 (two) times daily.  Marland Kitchen aspirin EC 81 MG tablet Take 1 tablet (81 mg total) by mouth daily.  . carbidopa-levodopa (SINEMET IR) 25-100 MG tablet Take 2 tablets by mouth 3 (three) times daily.   .  carboxymethylcellulose (REFRESH PLUS) 0.5 % SOLN Place 1 drop into both eyes 3 (three) times daily as needed (for dry eyes).   . Cholecalciferol (VITAMIN D3) 1000 units CAPS Take 1,000 Units by mouth daily.   Marland Kitchen docusate sodium (COLACE) 100 MG capsule Take 100 mg by mouth daily.  . DULoxetine (CYMBALTA) 60 MG capsule Take 120 mg by mouth every morning.  Marland Kitchen glucosamine-chondroitin 500-400 MG tablet Take 1 tablet by mouth 2 (two) times daily.   Marland Kitchen loratadine (CLARITIN) 10 MG tablet Take 10 mg by mouth daily.  . memantine (NAMENDA) 10 MG tablet Take 10 mg by mouth 2 (two) times daily.  . metoprolol tartrate (LOPRESSOR) 25 MG tablet Take 0.5 tablets (12.5 mg total) by mouth 2 (two) times daily.  . nitroGLYCERIN (NITROSTAT) 0.4 MG SL tablet Place 1 tablet (0.4 mg total) under the tongue every 5 (five) minutes as needed for chest pain.  . Omega-3 Fatty Acids (FISH OIL) 1200 MG CAPS Take 1,200 mg by mouth 2 (two) times daily.  Marland Kitchen omeprazole (PRILOSEC) 20 MG capsule Take 20 mg by mouth 2 (two) times daily before a meal.   . polyethylene glycol powder (GLYCOLAX/MIRALAX) powder Take 8.5 g by mouth daily as needed. (Patient taking differently: Take 17 g by mouth daily as needed for mild constipation. )  . rosuvastatin (CRESTOR) 5 MG tablet Take 2.5 mg by mouth daily.   . traZODone (DESYREL) 100 MG tablet Take 100 mg by mouth at bedtime as needed for sleep.      Allergies:   Iodinated diagnostic agents and Sulfa drugs cross reactors   Social History   Socioeconomic History  . Marital status: Married    Spouse name: Not on file  . Number of children: Not on file  . Years of education: Not on file  . Highest education level: Not on file  Occupational History  . Not on file  Social Needs  . Financial resource strain: Very hard  . Food insecurity:    Worry: Patient refused    Inability: Patient refused  . Transportation needs:    Medical: Patient refused    Non-medical: Patient refused  Tobacco Use    . Smoking status: Former Smoker    Packs/day: 1.00    Years: 40.00    Pack years: 40.00    Types: Cigarettes    Last attempt to quit: 05/31/2002    Years since quitting: 15.7  . Smokeless tobacco: Never Used  Substance and Sexual Activity  . Alcohol use: Not Currently    Alcohol/week: 0.0 oz  . Drug use: Never  . Sexual activity: Yes  Lifestyle  . Physical activity:    Days per week: 3 days    Minutes per session: 30 min  . Stress: To some extent  Relationships  . Social connections:    Talks  on phone: More than three times a week    Gets together: Not on file    Attends religious service: Not on file    Active member of club or organization: Not on file    Attends meetings of clubs or organizations: More than 4 times per year    Relationship status: Married  Other Topics Concern  . Not on file  Social History Narrative   n/a     Family History:  The patient's family history includes Lymphoma in his sister.   ROS:   Please see the history of present illness.    Review of Systems  Constitution: Negative.  HENT: Positive for hearing loss.   Cardiovascular: Negative.   Respiratory: Negative.   Endocrine: Negative.   Hematologic/Lymphatic: Negative.   Musculoskeletal: Positive for back pain.  Gastrointestinal: Negative.   Genitourinary: Negative.   Neurological: Negative.    All other systems reviewed and are negative.   PHYSICAL EXAM:   VS:  BP (!) 144/80   Pulse (!) 57   Ht 5\' 8"  (1.727 m)   Wt 225 lb 9.6 oz (102.3 kg)   SpO2 97%   BMI 34.30 kg/m   Physical Exam  GEN: Obese, in no acute distress  Neck: no JVD, carotid bruits, or masses Cardiac:RRR; no murmurs, rubs, or gallops  Respiratory:  clear to auscultation bilaterally, normal work of breathing GI: soft, nontender, nondistended, + BS Ext: Right arm at cath site without hematoma or hemorrhage, good radial brachial pulses, lower extremities without cyanosis, clubbing, or edema, Good distal pulses  bilaterally Neuro:  Alert and Oriented x 3 Psych: euthymic mood, full affect  Wt Readings from Last 3 Encounters:  03/04/18 225 lb 9.6 oz (102.3 kg)  02/12/18 226 lb 1.6 oz (102.6 kg)  10/08/17 238 lb 1.6 oz (108 kg)      Studies/Labs Reviewed:   EKG:  EKG is not ordered today.   Recent Labs: 02/08/2018: TSH 1.628 02/10/2018: BUN 16; Creatinine, Ser 1.03; Potassium 3.7; Sodium 140 02/12/2018: Hemoglobin 13.7; Platelets 174   Lipid Panel    Component Value Date/Time   CHOL 132 02/12/2018 0344   TRIG 84 02/12/2018 0344   HDL 50 02/12/2018 0344   CHOLHDL 2.6 02/12/2018 0344   VLDL 17 02/12/2018 0344   LDLCALC 65 02/12/2018 0344    Additional studies/ records that were reviewed today include:  Left heart catheterization 02/12/18:  Prox RCA to Mid RCA lesion is 20% stenosed.  Ost 2nd Mrg lesion is 50% stenosed.  Ost 1st Diag to 1st Diag lesion is 40% stenosed.  Prox LAD lesion is 20% stenosed.   Evidence for mild coronary calcification in all coronary arteries with 20% smooth LAD stenosis, 40% mid first diagonal stenosis; 50% proximal narrowing in the circumflex marginal branch, and large dominant RCA with smooth 20% mid stenosis.   LVEDP 19 mmHg.   RECOMMENDATION: Medical therapy for CAD.  The patient had converted to sinus rhythm; recommend to initiate oral anticoagulation of choice on 02/13/18 with ASA 81 mg.   Echocardiogram 02/09/18: Study Conclusions   - Left ventricle: The cavity size was normal. Wall thickness was   normal. Systolic function was vigorous. The estimated ejection   fraction was in the range of 65% to 70%. Wall motion was normal;   there were no regional wall motion abnormalities. _____________     ASSESSMENT:    1. Atherosclerosis of native coronary artery of native heart without angina pectoris   2. Atrial flutter with  rapid ventricular response ([place])   3. Essential hypertension   4. Mixed hyperlipidemia      PLAN:  In order of  problems listed above:  CAD recent admission with chest pain in the setting of rapid atrial fibrillation.  Cardiac catheterization showed mild to moderate nonobstructive disease as described above.  Recommend medical therapy.  On aspirin and metoprolol  Atrial fibrillation/flutter with RVR in the setting of steroid Dosepak converted to normal sinus rhythm with IV diltiazem could be secondary to steroid Dosepak for back pain.  CHA2DS2-VASc equals 3 started on Eliquis-left atrium normal in size.  Patient has missed some doses of Eliquis.  I have asked him to stay on this at least until he sees Dr. Domenic Polite back to discuss whether or not he could stop it.  Essential hypertension blood pressure has been low normal at home.  No changes today.  Hyperlipidemia continue Crestor LDL at goal at 65 on 02/12/2018  Medication Adjustments/Labs and Tests Ordered: Current medicines are reviewed at length with the patient today.  Concerns regarding medicines are outlined above.  Medication changes, Labs and Tests ordered today are listed in the Patient Instructions below. There are no Patient Instructions on file for this visit.   Signed, Ermalinda Barrios, PA-C  03/04/2018 1:14 PM    [facility] [place], [place], [place]  [address] Phone: [phone]; Fax: [phone]

## 2018-02-27 NOTE — Telephone Encounter (Signed)
Patients wife contacted office stating patient was just recent discharged from [facility]. Patient felt better when discharged but today has had shortness of breath, cold sweats, weakness, non radiating chest pain (6/10), nausea and leg pain. Patients wife states this started after patient took his morning dose of sinemet. Wife reports this episode lasted about an hour. Patient felt some better then. Chest pain completely relieved. Patient took lunch time dose of sinemet and all the same symptoms happened again. This episode only lasted about 30-40 minutes. Patient is not currently having any chest pain or shortness of breath. Patient still feels very weak. BP 173/94 HR 90. Advised patient if chest pain came back he needed to go to the ER for further evaluation. Will forward to provider for recommendations.

## 2018-03-04 ENCOUNTER — Encounter: Payer: Self-pay | Admitting: Physician Assistant

## 2018-03-04 ENCOUNTER — Ambulatory Visit (INDEPENDENT_AMBULATORY_CARE_PROVIDER_SITE_OTHER): Payer: MEDICARE | Admitting: Physician Assistant

## 2018-03-04 VITALS — BP 144/80 | HR 57 | Ht 68.0 in | Wt 225.6 lb

## 2018-03-04 DIAGNOSIS — E782 Mixed hyperlipidemia: Secondary | ICD-10-CM | POA: Diagnosis not present

## 2018-03-04 DIAGNOSIS — I2510 Atherosclerotic heart disease of native coronary artery without angina pectoris: Secondary | ICD-10-CM | POA: Diagnosis not present

## 2018-03-04 DIAGNOSIS — I10 Essential (primary) hypertension: Secondary | ICD-10-CM | POA: Diagnosis not present

## 2018-03-04 DIAGNOSIS — I4892 Unspecified atrial flutter: Secondary | ICD-10-CM

## 2018-03-04 NOTE — Patient Instructions (Signed)
Medication Instructions:  Your physician recommends that you continue on your current medications as directed. Please refer to the Current Medication list given to you today.   Labwork: NONE   Testing/Procedures: NONE   Follow-Up: Your physician recommends that you schedule a follow-up appointment in: 2-3 Months with Dr. Domenic Polite.    Any Other Special Instructions Will Be Listed Below (If Applicable).     If you need a refill on your cardiac medications before your next appointment, please call your pharmacy.  Thank you for choosing [facility]!

## 2018-03-20 ENCOUNTER — Other Ambulatory Visit: Payer: Self-pay | Admitting: *Deleted

## 2018-03-21 MED ORDER — APIXABAN 5 MG PO TABS: 5.0000 mg | ORAL_TABLET | Freq: Two times a day (BID) | ORAL | 0 refills | Status: DC

## 2018-03-21 MED ORDER — METOPROLOL TARTRATE 25 MG PO TABS: 12.5000 mg | ORAL_TABLET | Freq: Two times a day (BID) | ORAL | 0 refills | Status: DC

## 2018-05-03 ENCOUNTER — Telehealth: Payer: Self-pay | Admitting: Cardiology

## 2018-05-03 DIAGNOSIS — I4891 Unspecified atrial fibrillation: Secondary | ICD-10-CM | POA: Diagnosis not present

## 2018-05-03 DIAGNOSIS — I10 Essential (primary) hypertension: Secondary | ICD-10-CM | POA: Diagnosis not present

## 2018-05-03 DIAGNOSIS — G20 Parkinson's disease: Secondary | ICD-10-CM | POA: Diagnosis not present

## 2018-05-03 DIAGNOSIS — R319 Hematuria, unspecified: Secondary | ICD-10-CM

## 2018-05-03 DIAGNOSIS — Z299 Encounter for prophylactic measures, unspecified: Secondary | ICD-10-CM | POA: Diagnosis not present

## 2018-05-03 DIAGNOSIS — Z6835 Body mass index (BMI) 35.0-35.9, adult: Secondary | ICD-10-CM | POA: Diagnosis not present

## 2018-05-03 MED ORDER — APIXABAN 5 MG PO TABS: 5.0000 mg | ORAL_TABLET | Freq: Two times a day (BID) | ORAL | Status: DC

## 2018-05-03 NOTE — Addendum Note (Signed)
Addended by: Laurine Blazer on: 05/03/2018 04:14 PM   Modules accepted: Orders

## 2018-05-03 NOTE — Telephone Encounter (Signed)
patient called stating that he has blood in his urine- A lot this morning but has cleared up some.    Taking Eliquis and was told by PCP to contact our office

## 2018-05-03 NOTE — Telephone Encounter (Signed)
Patient notified and verbalized understanding.  He will continue to hold his Eliquis for now.  He agrees to seeing Urology.  Will refer to [facility].

## 2018-05-03 NOTE — Telephone Encounter (Signed)
Received call back from patient.  Notified of provider recommendations.  Stated that he did see his pcp & had urinalysis done.  Did not find any infection, just the blood per patient.  Stated he was told by them to contact us back in regards to remaining on the Eliquis.  Stated the blood has not totally cleared, but is some better from this morning.  Explained to patient that Dr. Domenic Polite is probably still going to suggest he be evaluated by urology.  Stated that he is okay with that, but is concerned about remaining on the medication while there is still blood present.

## 2018-05-03 NOTE — Telephone Encounter (Signed)
I will inform Dr.McDowell 

## 2018-05-03 NOTE — Telephone Encounter (Signed)
If he is still having hematuria, I am certainly not opposed to him holding Eliquis temporarily as he undergoes further urological consultation.  This needs to be done in the near future however to reduce the amount of time that he is not on anticoagulation.

## 2018-05-03 NOTE — Telephone Encounter (Signed)
Appreciate notification.  If his urine has cleared up, he can probably continue the Eliquis for now.  He does need to see his PCP however and get a urinalysis.  With development of blood in the urine on an anticoagulant, he will probably need further evaluation by urologist to exclude any associated bladder pathology that may have been unmasked by anticoagulant therapy.

## 2018-05-07 DIAGNOSIS — R310 Gross hematuria: Secondary | ICD-10-CM | POA: Diagnosis not present

## 2018-05-08 DIAGNOSIS — Z23 Encounter for immunization: Secondary | ICD-10-CM | POA: Diagnosis not present

## 2018-05-13 ENCOUNTER — Encounter: Payer: Self-pay | Admitting: Cardiology

## 2018-05-13 NOTE — Progress Notes (Signed)
Cardiology Office Note  Date: 05/14/2018   ID: [name], DOB 1946-07-24, MRN [id number]  PCP: Glenda Chroman, MD  Primary Cardiologist: Rozann Lesches, MD   Chief Complaint  Patient presents with  . Coronary Artery Disease  . History of atrial fibrillation    History of Present Illness: David Combs is a 72 y.o. male last seen by Ms. Vita Barley in August.  I reviewed interval records and updated the chart.  He was diagnosed with atrial fibrillation back in July, was concurrently on a steroid Dosepak at that time.  He spontaneously converted to sinus rhythm with rate control, was placed on Eliquis for stroke prophylaxis in light of CHADSVASC score of 3.  He also underwent cardiac catheterization in July which demonstrated only mild to moderate CAD which was best managed medically.  Interval notes reviewed, he has had trouble with hematuria necessitating discontinuation of Eliquis for urological evaluation.  He has pending CT imaging and also possible cystoscopy.  He states that his urine is clear at this time.  He is on baby aspirin.  Otherwise, cardiac regimen includes low-dose Lopressor and Crestor.  He reports no angina symptoms.  Past Medical History:  Diagnosis Date  . Agent orange exposure   . Anxiety   . Arthritis   . Chronic lower back pain   . Coronary atherosclerosis of native coronary artery    70-80% small diagonal - medically managed, LVEF 55%  . Depression   . Dermatophytosis of nail   . ED (erectile dysfunction)   . Essential hypertension   . GERD (gastroesophageal reflux disease)   . History of colonic polyps 10/08/2017  . History of pneumonia   . Lumbago   . Mixed hyperlipidemia   . Non-ST elevation myocardial infarction (NSTEMI) ([place]) 05/2010  . Obesity   . Parkinson disease ([place])   . Paroxysmal atrial fibrillation ([facility])   . Prostatitis   . Sleep apnea     Past Surgical History:  Procedure Laterality Date  . ANTERIOR CERVICAL  DECOMP/DISCECTOMY FUSION    . APPENDECTOMY    . BACK SURGERY    . CARDIAC CATHETERIZATION  X 3  . CATARACT EXTRACTION W/ INTRAOCULAR LENS  IMPLANT, BILATERAL Bilateral   . CHOLECYSTECTOMY OPEN  05/2014   rt upper quadrant pain   . COLONOSCOPY N/A 11/29/2017   Procedure: COLONOSCOPY;  Surgeon: Rogene Houston, MD;  [facility];  Service: Endoscopy;  Laterality: N/A;  8:30  . ESOPHAGOGASTRODUODENOSCOPY N/A 12/02/2014   Procedure: ESOPHAGOGASTRODUODENOSCOPY (EGD);  Surgeon: Rogene Houston, MD;  [facility];  Service: Endoscopy;  Laterality: N/A;  200  . FRACTURE SURGERY    . KNEE ARTHROSCOPY Right   . LEFT HEART CATH AND CORONARY ANGIOGRAPHY N/A 02/12/2018   Procedure: LEFT HEART CATH AND CORONARY ANGIOGRAPHY;  Surgeon: Troy Sine, MD;  Location: [facility];  Service: Cardiovascular;  Laterality: N/A;  . LUMBAR [place]    . POLYPECTOMY  11/29/2017   Procedure: POLYPECTOMY;  Surgeon: Rogene Houston, MD;  [facility];  Service: Endoscopy;;  transverse  . TONSILLECTOMY  1958  . WRIST FRACTURE SURGERY Left 1994    Current Outpatient Medications  Medication Sig Dispense Refill  . acetaminophen (TYLENOL) 325 MG tablet Take 650 mg by mouth every 6 (six) hours as needed for moderate pain or headache.     Marland Kitchen aspirin EC 81 MG tablet Take 1 tablet (81 mg total) by mouth daily.    Marland Kitchen  carbidopa-levodopa (SINEMET IR) 25-100 MG tablet Take 2 tablets by mouth 3 (three) times daily.     . carboxymethylcellulose (REFRESH PLUS) 0.5 % SOLN Place 1 drop into both eyes 3 (three) times daily as needed (for dry eyes).     . Cholecalciferol (VITAMIN D3) 1000 units CAPS Take 1,000 Units by mouth daily.     Marland Kitchen docusate sodium (COLACE) 100 MG capsule Take 100 mg by mouth daily.    . DULoxetine (CYMBALTA) 60 MG capsule Take 120 mg by mouth every morning.    Marland Kitchen glucosamine-chondroitin 500-400 MG tablet Take 1 tablet by mouth 2 (two) times daily.     Marland Kitchen loratadine  (CLARITIN) 10 MG tablet Take 10 mg by mouth daily.    . memantine (NAMENDA) 10 MG tablet Take 10 mg by mouth 2 (two) times daily.    . metoprolol tartrate (LOPRESSOR) 25 MG tablet Take 0.5 tablets (12.5 mg total) by mouth 2 (two) times daily. 30 tablet 0  . nitroGLYCERIN (NITROSTAT) 0.4 MG SL tablet Place 1 tablet (0.4 mg total) under the tongue every 5 (five) minutes as needed for chest pain. 75 tablet 3  . Omega-3 Fatty Acids (FISH OIL) 1200 MG CAPS Take 1,200 mg by mouth 2 (two) times daily.    Marland Kitchen omeprazole (PRILOSEC) 20 MG capsule Take 20 mg by mouth 2 (two) times daily before a meal.     . polyethylene glycol powder (GLYCOLAX/MIRALAX) powder Take 8.5 g by mouth daily as needed. (Patient taking differently: Take 17 g by mouth daily as needed for mild constipation. ) 255 g 0  . rosuvastatin (CRESTOR) 5 MG tablet Take 2.5 mg by mouth daily.     . traZODone (DESYREL) 100 MG tablet Take 100 mg by mouth at bedtime as needed for sleep.      No current facility-administered medications for this visit.    Allergies:  Iodinated diagnostic agents and Sulfa drugs cross reactors   Social History: The patient  reports that he quit smoking about 15 years ago. His smoking use included cigarettes. He has a 40.00 pack-year smoking history. He has never used smokeless tobacco. He reports that he drank alcohol. He reports that he does not use drugs.   ROS:  Please see the history of present illness. Otherwise, complete review of systems is positive for none.  All other systems are reviewed and negative.   Physical Exam: VS:  BP 122/78   Pulse (!) 50   Ht 5\' 7"  (1.702 m)   Wt 232 lb (105.2 kg)   SpO2 98%   BMI 36.34 kg/m , BMI Body mass index is 36.34 kg/m.  Wt Readings from Last 3 Encounters:  05/14/18 232 lb (105.2 kg)  03/04/18 225 lb 9.6 oz (102.3 kg)  02/12/18 226 lb 1.6 oz (102.6 kg)    General: Obese male, appears comfortable at rest. HEENT: Conjunctiva and lids normal, oropharynx  clear. Neck: Supple, no elevated JVP or carotid bruits, no thyromegaly. Lungs: Clear to auscultation, nonlabored breathing at rest. Cardiac: Regular rate and rhythm, no S3 or significant systolic murmur. Abdomen: Soft, nontender, bowel sounds present. Extremities: No pitting edema, distal pulses 2+. Skin: Warm and dry. Musculoskeletal: No kyphosis. Neuropsychiatric: Alert and oriented x3, affect grossly appropriate.  ECG: I personally reviewed the tracing from 02/11/2018 which showed sinus rhythm with inferior Q waves.  Recent Labwork: 02/08/2018: TSH 1.628 02/10/2018: BUN 16; Creatinine, Ser 1.03; Potassium 3.7; Sodium 140 02/12/2018: Hemoglobin 13.7; Platelets 174     Component Value  Date/Time   CHOL 132 02/12/2018 0344   TRIG 84 02/12/2018 0344   HDL 50 02/12/2018 0344   CHOLHDL 2.6 02/12/2018 0344   VLDL 17 02/12/2018 0344   LDLCALC 65 02/12/2018 0344    Other Studies Reviewed Today:  Cardiac catheterization 02/12/2018:  Prox RCA to Mid RCA lesion is 20% stenosed.  Ost 2nd Mrg lesion is 50% stenosed.  Ost 1st Diag to 1st Diag lesion is 40% stenosed.  Prox LAD lesion is 20% stenosed.   Evidence for mild coronary calcification in all coronary arteries with 20% smooth LAD stenosis, 40% mid first diagonal stenosis; 50% proximal narrowing in the circumflex marginal branch, and large dominant RCA with smooth 20% mid stenosis.  LVEDP 19 mmHg.  Echocardiogram 02/09/2018: Study Conclusions  - Left ventricle: The cavity size was normal. Wall thickness was   normal. Systolic function was vigorous. The estimated ejection   fraction was in the range of 65% to 70%. Wall motion was normal;   there were no regional wall motion abnormalities.  Assessment and Plan:  1.  Transient atrial fibrillation as outlined above.  No obvious recurring arrhythmia or palpitations since that time.  CHADSVASC score is 3.  Eliquis is currently on hold pending further work-up of hematuria.  He is  back on low-dose aspirin and continues on Lopressor.  Today we talked about rationale for continuing long-term anticoagulation, but will have further discussion once his urological work-up is complete.  2.  Mild to moderate, nonobstructive CAD by recent cardiac catheterization in July.  Continue statin therapy and aspirin.  3.  Hyperlipidemia, continues on Crestor with follow-up per PCP.  Last LDL 65.  4.  Essential hypertension, blood pressure is adequately controlled today.  Current medicines were reviewed with the patient today.  Disposition: Follow-up in 4 weeks.  Signed, Satira Sark, MD, [facility] 05/14/2018 10:07 AM    [facility], [place], [place] [address] Phone: [phone]; Fax: [phone]

## 2018-05-14 ENCOUNTER — Encounter: Payer: Self-pay | Admitting: Cardiology

## 2018-05-14 ENCOUNTER — Ambulatory Visit (INDEPENDENT_AMBULATORY_CARE_PROVIDER_SITE_OTHER): Payer: MEDICARE | Admitting: Cardiology

## 2018-05-14 VITALS — BP 122/78 | HR 50 | Ht 67.0 in | Wt 232.0 lb

## 2018-05-14 DIAGNOSIS — Z8679 Personal history of other diseases of the circulatory system: Secondary | ICD-10-CM

## 2018-05-14 DIAGNOSIS — I2510 Atherosclerotic heart disease of native coronary artery without angina pectoris: Secondary | ICD-10-CM

## 2018-05-14 DIAGNOSIS — I10 Essential (primary) hypertension: Secondary | ICD-10-CM | POA: Diagnosis not present

## 2018-05-14 DIAGNOSIS — I25119 Atherosclerotic heart disease of native coronary artery with unspecified angina pectoris: Secondary | ICD-10-CM | POA: Diagnosis not present

## 2018-05-14 DIAGNOSIS — E782 Mixed hyperlipidemia: Secondary | ICD-10-CM | POA: Diagnosis not present

## 2018-05-14 NOTE — Patient Instructions (Signed)
Medication Instructions:   Remain off of the Eliquis pending Urology work up.  Continue all other medications.    Labwork: none  Testing/Procedures: none  Follow-Up: 3-4 weeks   Any Other Special Instructions Will Be Listed Below (If Applicable).  If you need a refill on your cardiac medications before your next appointment, please call your pharmacy.

## 2018-05-15 DIAGNOSIS — N200 Calculus of kidney: Secondary | ICD-10-CM | POA: Diagnosis not present

## 2018-05-15 DIAGNOSIS — R310 Gross hematuria: Secondary | ICD-10-CM | POA: Diagnosis not present

## 2018-06-21 ENCOUNTER — Encounter: Payer: Self-pay | Admitting: Cardiology

## 2018-06-21 ENCOUNTER — Ambulatory Visit (INDEPENDENT_AMBULATORY_CARE_PROVIDER_SITE_OTHER): Payer: MEDICARE | Admitting: Cardiology

## 2018-06-21 VITALS — BP 114/70 | HR 59 | Ht 67.0 in | Wt 233.6 lb

## 2018-06-21 DIAGNOSIS — I25119 Atherosclerotic heart disease of native coronary artery with unspecified angina pectoris: Secondary | ICD-10-CM

## 2018-06-21 DIAGNOSIS — Z8679 Personal history of other diseases of the circulatory system: Secondary | ICD-10-CM | POA: Diagnosis not present

## 2018-06-21 DIAGNOSIS — I2510 Atherosclerotic heart disease of native coronary artery without angina pectoris: Secondary | ICD-10-CM

## 2018-06-21 DIAGNOSIS — E782 Mixed hyperlipidemia: Secondary | ICD-10-CM | POA: Diagnosis not present

## 2018-06-21 NOTE — Patient Instructions (Addendum)
Medication Instructions:   Your physician recommends that you continue on your current medications as directed. Please refer to the Current Medication list given to you today.  Labwork:  NONE  Testing/Procedures:   NONE  Follow-Up:  Your physician recommends that you schedule a follow-up appointment in: 3 months.  Any Other Special Instructions Will Be Listed Below (If Applicable).  Call our office back after your cystoscopy.  If you need a refill on your cardiac medications before your next appointment, please call your pharmacy.

## 2018-06-21 NOTE — Progress Notes (Signed)
Cardiology Office Note  Date: 06/21/2018   ID: MAIKOL GRASSIA, DOB 17-May-1946, MRN [id number]  PCP: Glenda Chroman, MD  Primary Cardiologist: Rozann Lesches, MD   Chief Complaint  Patient presents with  . Cardiac follow-up    History of Present Illness: TAEGAN STANDAGE is a 72 y.o. male last seen in October.  He presents for a follow-up visit.  No new symptoms from a cardiac perspective, specifically no chest pain or palpitations.  He also denies any further hematuria, no changes in bowel habits or stools.  He tells me that he has a scheduled visit with urology next week for further assessment of his hematuria.  He remains off Eliquis.  Current cardiac medications include aspirin, Lopressor, Crestor, and as needed nitroglycerin.  Recent cardiac catheterization demonstrated mild to moderate CAD which we are managing medically.  Past Medical History:  Diagnosis Date  . Agent orange exposure   . Anxiety   . Arthritis   . Chronic lower back pain   . Coronary atherosclerosis of native coronary artery    70-80% small diagonal - medically managed, LVEF 55%  . Depression   . Dermatophytosis of nail   . ED (erectile dysfunction)   . Essential hypertension   . GERD (gastroesophageal reflux disease)   . History of colonic polyps 10/08/2017  . History of pneumonia   . Lumbago   . Mixed hyperlipidemia   . Non-ST elevation myocardial infarction (NSTEMI) ([place]) 05/2010  . Obesity   . Parkinson disease ([place])   . Paroxysmal atrial fibrillation (H[facility])   . Prostatitis   . Sleep apnea     Past Surgical History:  Procedure Laterality Date  . ANTERIOR CERVICAL DECOMP/DISCECTOMY FUSION    . APPENDECTOMY    . BACK SURGERY    . CARDIAC CATHETERIZATION  X 3  . CATARACT EXTRACTION W/ INTRAOCULAR LENS  IMPLANT, BILATERAL Bilateral   . CHOLECYSTECTOMY OPEN  05/2014   rt upper quadrant pain   . COLONOSCOPY N/A 11/29/2017   Procedure: COLONOSCOPY;  Surgeon: Rogene Houston, MD;  [facility];  Service: Endoscopy;  Laterality: N/A;  8:30  . ESOPHAGOGASTRODUODENOSCOPY N/A 12/02/2014   Procedure: ESOPHAGOGASTRODUODENOSCOPY (EGD);  Surgeon: Rogene Houston, MD;  [facility];  Service: Endoscopy;  Laterality: N/A;  200  . FRACTURE SURGERY    . KNEE ARTHROSCOPY Right   . LEFT HEART CATH AND CORONARY ANGIOGRAPHY N/A 02/12/2018   Procedure: LEFT HEART CATH AND CORONARY ANGIOGRAPHY;  Surgeon: Troy Sine, MD;  Location: [facility];  Service: Cardiovascular;  Laterality: N/A;  . LUMBAR [place]    . POLYPECTOMY  11/29/2017   Procedure: POLYPECTOMY;  Surgeon: Rogene Houston, MD;  [facility];  Service: Endoscopy;;  transverse  . TONSILLECTOMY  1958  . WRIST FRACTURE SURGERY Left 1994    Current Outpatient Medications  Medication Sig Dispense Refill  . acetaminophen (TYLENOL) 325 MG tablet Take 650 mg by mouth every 6 (six) hours as needed for moderate pain or headache.     Marland Kitchen aspirin EC 81 MG tablet Take 1 tablet (81 mg total) by mouth daily.    . carbidopa-levodopa (SINEMET IR) 25-100 MG tablet Take 2 tablets by mouth 3 (three) times daily.     . carboxymethylcellulose (REFRESH PLUS) 0.5 % SOLN Place 1 drop into both eyes 3 (three) times daily as needed (for dry eyes).     . Cholecalciferol (VITAMIN D3) 1000 units CAPS  Take 1,000 Units by mouth daily.     Marland Kitchen docusate sodium (COLACE) 100 MG capsule Take 100 mg by mouth daily.    . DULoxetine (CYMBALTA) 60 MG capsule Take 120 mg by mouth every morning.    Marland Kitchen glucosamine-chondroitin 500-400 MG tablet Take 1 tablet by mouth 2 (two) times daily.     Marland Kitchen loratadine (CLARITIN) 10 MG tablet Take 10 mg by mouth daily.    . memantine (NAMENDA) 10 MG tablet Take 10 mg by mouth 2 (two) times daily.    . metoprolol tartrate (LOPRESSOR) 25 MG tablet Take 0.5 tablets (12.5 mg total) by mouth 2 (two) times daily. 30 tablet 0  . nitroGLYCERIN (NITROSTAT) 0.4 MG SL tablet Place 1 tablet (0.4 mg total) under the  tongue every 5 (five) minutes as needed for chest pain. 75 tablet 3  . Omega-3 Fatty Acids (FISH OIL) 1200 MG CAPS Take 1,200 mg by mouth 2 (two) times daily.    Marland Kitchen omeprazole (PRILOSEC) 20 MG capsule Take 20 mg by mouth 2 (two) times daily before a meal.     . polyethylene glycol powder (GLYCOLAX/MIRALAX) powder Take 8.5 g by mouth daily as needed. (Patient taking differently: Take 17 g by mouth daily as needed for mild constipation. ) 255 g 0  . rosuvastatin (CRESTOR) 5 MG tablet Take 2.5 mg by mouth daily.     . traZODone (DESYREL) 100 MG tablet Take 100 mg by mouth at bedtime as needed for sleep.      No current facility-administered medications for this visit.    Allergies:  Iodinated diagnostic agents and Sulfa drugs cross reactors   Social History: The patient  reports that he quit smoking about 16 years ago. His smoking use included cigarettes. He has a 40.00 pack-year smoking history. He has never used smokeless tobacco. He reports that he drank alcohol. He reports that he does not use drugs.   ROS:  Please see the history of present illness. Otherwise, complete review of systems is positive for none.  All other systems are reviewed and negative.   Physical Exam: VS:  BP 114/70   Pulse (!) 59   Ht 5\' 7"  (1.702 m)   Wt 233 lb 9.6 oz (106 kg)   SpO2 97%   BMI 36.59 kg/m , BMI Body mass index is 36.59 kg/m.  Wt Readings from Last 3 Encounters:  06/21/18 233 lb 9.6 oz (106 kg)  05/14/18 232 lb (105.2 kg)  03/04/18 225 lb 9.6 oz (102.3 kg)    General: Obese male, appears comfortable at rest. HEENT: Conjunctiva and lids normal, oropharynx clear. Neck: Supple, no elevated JVP or carotid bruits, no thyromegaly. Lungs: Clear to auscultation, nonlabored breathing at rest. Cardiac: Regular rate and rhythm, no S3 or significant systolic murmur. Abdomen: Soft, nontender, bowel sounds present. Extremities: No pitting edema, distal pulses 2+. Skin: Warm and dry. Musculoskeletal: No  kyphosis. Neuropsychiatric: Alert and oriented x3, affect grossly appropriate.  ECG: I personally reviewed the tracing from 02/11/2018 which showed sinus rhythm with inferior Q waves.  Recent Labwork: 02/08/2018: TSH 1.628 02/10/2018: BUN 16; Creatinine, Ser 1.03; Potassium 3.7; Sodium 140 02/12/2018: Hemoglobin 13.7; Platelets 174     Component Value Date/Time   CHOL 132 02/12/2018 0344   TRIG 84 02/12/2018 0344   HDL 50 02/12/2018 0344   CHOLHDL 2.6 02/12/2018 0344   VLDL 17 02/12/2018 0344   LDLCALC 65 02/12/2018 0344    Other Studies Reviewed Today:  Cardiac catheterization 02/12/2018:  Prox  RCA to Mid RCA lesion is 20% stenosed.  Ost 2nd Mrg lesion is 50% stenosed.  Ost 1st Diag to 1st Diag lesion is 40% stenosed.  Prox LAD lesion is 20% stenosed.  Evidence for mild coronary calcification in all coronary arteries with 20% smooth LAD stenosis, 40% mid first diagonal stenosis; 50% proximal narrowing in the circumflex marginal branch, and large dominant RCA with smooth 20% mid stenosis.  LVEDP 19 mmHg.  Echocardiogram 02/09/2018: Study Conclusions  - Left ventricle: The cavity size was normal. Wall thickness was normal. Systolic function was vigorous. The estimated ejection fraction was in the range of 65% to 70%. Wall motion was normal; there were no regional wall motion abnormalities.  Assessment and Plan:  1.  Transient atrial fibrillation without obvious recurrences.  Occurred during treatment with steroids.  CHADSVASC score is 3, we have discussed rationale for continued anticoagulation to reduce stroke risk, however with his history of hematuria necessitating discontinuation of Eliquis and further urological evaluation, this issue is on hold.  He is in sinus rhythm at this point.  2.  Mild to moderate CAD by recent cardiac catheterization.  Plan is to continue medical therapy, currently on aspirin, beta-blocker, and statin.  3.  Hyperlipidemia, on  Crestor.  Current medicines were reviewed with the patient today.  Disposition: He will call to let us know when urological evaluation is complete.  Follow-up in 3 months.  Signed, Satira Sark, MD, [facility] 06/21/2018 2:52 PM    [facility], [place], [place] [address] Phone: [phone]; Fax: [phone]

## 2018-06-25 ENCOUNTER — Ambulatory Visit (INDEPENDENT_AMBULATORY_CARE_PROVIDER_SITE_OTHER): Payer: MEDICARE | Admitting: Urology

## 2018-06-25 DIAGNOSIS — R310 Gross hematuria: Secondary | ICD-10-CM | POA: Diagnosis not present

## 2018-09-30 DIAGNOSIS — Z299 Encounter for prophylactic measures, unspecified: Secondary | ICD-10-CM | POA: Diagnosis not present

## 2018-09-30 DIAGNOSIS — I4891 Unspecified atrial fibrillation: Secondary | ICD-10-CM | POA: Diagnosis not present

## 2018-09-30 DIAGNOSIS — G20 Parkinson's disease: Secondary | ICD-10-CM | POA: Diagnosis not present

## 2018-09-30 DIAGNOSIS — I10 Essential (primary) hypertension: Secondary | ICD-10-CM | POA: Diagnosis not present

## 2018-09-30 DIAGNOSIS — Z6836 Body mass index (BMI) 36.0-36.9, adult: Secondary | ICD-10-CM | POA: Diagnosis not present

## 2018-09-30 NOTE — Progress Notes (Signed)
Cardiology Office Note  Date: 10/01/2018   ID: FRANKLIN BAUMBACH, DOB 20-Oct-1945, MRN [id number]  PCP: Glenda Chroman, MD  Primary Cardiologist: Rozann Lesches, MD   Chief Complaint  Patient presents with  . Preoperative evaluation    History of Present Illness: David Combs is a 73 y.o. male last seen in November 2019.  He presents for a follow-up evaluation with his wife.  He is being considered for a left shoulder surgery under general anesthesia per Dr. Veverly Fells.  From a cardiac perspective he has been stable, no progressive angina symptoms, no palpitations or documented recurrent atrial fibrillation.  He underwent cardiac testing in July of last year including a cardiac catheterization that demonstrated overall mild to moderate coronary atherosclerosis without obstruction, his LVEF was 65 to 70% range as well.  He has had no obvious recurring atrial fibrillation since event documented last year.  CHADSVASC score is 3.  He came off of Eliquis in the setting of hematuria, but tells me that urology evaluation found no acute events.  He has stay off anticoagulation so far.  RCRI perioperative cardiac risk calculator indicates class II at 0.9% risk of major cardiac event.  Current medications are listed below.  Past Medical History:  Diagnosis Date  . Agent orange exposure   . Anxiety   . Arthritis   . Chronic lower back pain   . Coronary atherosclerosis of native coronary artery    70-80% small diagonal - medically managed, LVEF 55%  . Depression   . Dermatophytosis of nail   . ED (erectile dysfunction)   . Essential hypertension   . GERD (gastroesophageal reflux disease)   . History of colonic polyps 10/08/2017  . History of pneumonia   . Lumbago   . Mixed hyperlipidemia   . Non-ST elevation myocardial infarction (NSTEMI) ([place]) 05/2010  . Obesity   . Parkinson disease ([place])   . Paroxysmal atrial fibrillation (HCC)   . Prostatitis   . Sleep apnea     Past Surgical  History:  Procedure Laterality Date  . ANTERIOR CERVICAL DECOMP/DISCECTOMY FUSION    . APPENDECTOMY    . BACK SURGERY    . CARDIAC CATHETERIZATION  X 3  . CATARACT EXTRACTION W/ INTRAOCULAR LENS  IMPLANT, BILATERAL Bilateral   . CHOLECYSTECTOMY OPEN  05/2014   rt upper quadrant pain   . COLONOSCOPY N/A 11/29/2017   Procedure: COLONOSCOPY;  Surgeon: Rogene Houston, MD;  [facility];  Service: Endoscopy;  Laterality: N/A;  8:30  . ESOPHAGOGASTRODUODENOSCOPY N/A 12/02/2014   Procedure: ESOPHAGOGASTRODUODENOSCOPY (EGD);  Surgeon: Rogene Houston, MD;  [facility];  Service: Endoscopy;  Laterality: N/A;  200  . FRACTURE SURGERY    . KNEE ARTHROSCOPY Right   . LEFT HEART CATH AND CORONARY ANGIOGRAPHY N/A 02/12/2018   Procedure: LEFT HEART CATH AND CORONARY ANGIOGRAPHY;  Surgeon: Troy Sine, MD;  Location: [facility];  Service: Cardiovascular;  Laterality: N/A;  . LUMBAR [place]    . POLYPECTOMY  11/29/2017   Procedure: POLYPECTOMY;  Surgeon: Rogene Houston, MD;  [facility];  Service: Endoscopy;;  transverse  . TONSILLECTOMY  1958  . WRIST FRACTURE SURGERY Left 1994    Current Outpatient Medications  Medication Sig Dispense Refill  . acetaminophen (TYLENOL) 325 MG tablet Take 650 mg by mouth every 6 (six) hours as needed for moderate pain or headache.     Marland Kitchen aspirin EC 81 MG tablet Take  1 tablet (81 mg total) by mouth daily.    . carbidopa-levodopa (SINEMET IR) 25-100 MG tablet Take 2 tablets by mouth 3 (three) times daily.     . carboxymethylcellulose (REFRESH PLUS) 0.5 % SOLN Place 1 drop into both eyes 3 (three) times daily as needed (for dry eyes).     . Cholecalciferol (VITAMIN D3) 1000 units CAPS Take 1,000 Units by mouth daily.     Marland Kitchen docusate sodium (COLACE) 100 MG capsule Take 100 mg by mouth daily.    Marland Kitchen glucosamine-chondroitin 500-400 MG tablet Take 1 tablet by mouth 2 (two) times daily.     Marland Kitchen loratadine (CLARITIN) 10 MG tablet  Take 10 mg by mouth daily.    . memantine (NAMENDA) 10 MG tablet Take 10 mg by mouth 2 (two) times daily.    . metoprolol tartrate (LOPRESSOR) 25 MG tablet Take 0.5 tablets (12.5 mg total) by mouth 2 (two) times daily. 30 tablet 0  . nitroGLYCERIN (NITROSTAT) 0.4 MG SL tablet Place 1 tablet (0.4 mg total) under the tongue every 5 (five) minutes as needed for chest pain. 75 tablet 3  . omeprazole (PRILOSEC) 20 MG capsule Take 20 mg by mouth 2 (two) times daily before a meal.     . polyethylene glycol powder (GLYCOLAX/MIRALAX) powder Take 8.5 g by mouth daily as needed. (Patient taking differently: Take 17 g by mouth daily as needed for mild constipation. ) 255 g 0  . rosuvastatin (CRESTOR) 5 MG tablet Take 2.5 mg by mouth daily.     . traMADol (ULTRAM) 5 mg/mL SUSP tramadol     No current facility-administered medications for this visit.    Allergies:  Iodinated diagnostic agents and Sulfa drugs cross reactors   Social History: The patient  reports that he quit smoking about 16 years ago. His smoking use included cigarettes. He has a 40.00 pack-year smoking history. He has never used smokeless tobacco. He reports previous alcohol use. He reports that he does not use drugs.   ROS:  Please see the history of present illness. Otherwise, complete review of systems is positive for left shoulder and biceps pain.  All other systems are reviewed and negative.   Physical Exam: VS:  BP (!) 158/88 (BP Location: Right Arm)   Pulse (!) 54   Ht 5\' 7"  (1.702 m)   Wt 236 lb (107 kg)   SpO2 99%   BMI 36.96 kg/m , BMI Body mass index is 36.96 kg/m.  Wt Readings from Last 3 Encounters:  10/01/18 236 lb (107 kg)  06/21/18 233 lb 9.6 oz (106 kg)  05/14/18 232 lb (105.2 kg)    General: Obese male, appears comfortable at rest. HEENT: Conjunctiva and lids normal, oropharynx clear. Neck: Supple, no elevated JVP or carotid bruits, no thyromegaly. Lungs: Clear to auscultation, nonlabored breathing at  rest. Cardiac: Regular rate and rhythm, no S3 or significant systolic murmur. Abdomen: Obese, nontender, bowel sounds present. Extremities: No pitting edema, distal pulses 2+. Skin: Warm and dry. Musculoskeletal: No kyphosis. Neuropsychiatric: Alert and oriented x3, affect grossly appropriate.  ECG: I personally reviewed the tracing from 02/11/2018 which showed sinus rhythm with inferior Q waves.  Recent Labwork: 02/08/2018: TSH 1.628 02/10/2018: BUN 16; Creatinine, Ser 1.03; Potassium 3.7; Sodium 140 02/12/2018: Hemoglobin 13.7; Platelets 174     Component Value Date/Time   CHOL 132 02/12/2018 0344   TRIG 84 02/12/2018 0344   HDL 50 02/12/2018 0344   CHOLHDL 2.6 02/12/2018 0344   VLDL 17 02/12/2018  [place] 65 02/12/2018 0344    Other Studies Reviewed Today:  Cardiac catheterization 02/12/2018:  Prox RCA to Mid RCA lesion is 20% stenosed.  Ost 2nd Mrg lesion is 50% stenosed.  Ost 1st Diag to 1st Diag lesion is 40% stenosed.  Prox LAD lesion is 20% stenosed.  Evidence for mild coronary calcification in all coronary arteries with 20% smooth LAD stenosis, 40% mid first diagonal stenosis; 50% proximal narrowing in the circumflex marginal branch, and large dominant RCA with smooth 20% mid stenosis.  LVEDP 19 mmHg.  Echocardiogram 02/09/2018: Study Conclusions  - Left ventricle: The cavity size was normal. Wall thickness was normal. Systolic function was vigorous. The estimated ejection fraction was in the range of 65% to 70%. Wall motion was normal; there were no regional wall motion abnormalities.  Assessment and Plan:  1.  Preoperative cardiac evaluation in a 73 year old male with a history of mild to moderate nonobstructive CAD by cardiac catheterization in July 2019, transient atrial fibrillation also documented in July 2019 with CHADSVASC score of 3 but currently not anticoagulated, hypertension, and hyperlipidemia.  He has been clinically stable without  progressive angina symptoms.  RCRI perioperative cardiac risk calculator indicates class II at 0.9% chance of major cardiac event, relatively low risk category.  No further cardiac testing is indicated at this time.  He may hold aspirin 7 days prior to the procedure.  Otherwise continue baseline cardiac regimen.  2.  Transient atrial fibrillation in the setting of steroid treatment last year, CHADSVASC score is 3.  He has maintained sinus rhythm so far.  We discussed consideration of reinstituting Eliquis after he gets through his shoulder surgery.  Follow-up will be arranged.  3.  Mixed hyperlipidemia, remains on Crestor.  Current medicines were reviewed with the patient today.   Orders Placed This Encounter  Procedures  . Basic metabolic panel  . CBC     Disposition: Follow-up in 3 months.  Signed, Satira Sark, MD, [facility] 10/01/2018 9:58 AM    [facility], [place], [place] [address] Phone: [phone]; Fax: [phone]

## 2018-10-01 ENCOUNTER — Encounter: Payer: Self-pay | Admitting: Cardiology

## 2018-10-01 ENCOUNTER — Ambulatory Visit (INDEPENDENT_AMBULATORY_CARE_PROVIDER_SITE_OTHER): Payer: MEDICARE | Admitting: Cardiology

## 2018-10-01 VITALS — BP 158/88 | HR 54 | Ht 67.0 in | Wt 236.0 lb

## 2018-10-01 DIAGNOSIS — Z8679 Personal history of other diseases of the circulatory system: Secondary | ICD-10-CM

## 2018-10-01 DIAGNOSIS — I10 Essential (primary) hypertension: Secondary | ICD-10-CM | POA: Diagnosis not present

## 2018-10-01 DIAGNOSIS — Z01810 Encounter for preprocedural cardiovascular examination: Secondary | ICD-10-CM

## 2018-10-01 DIAGNOSIS — E782 Mixed hyperlipidemia: Secondary | ICD-10-CM

## 2018-10-01 DIAGNOSIS — Z79899 Other long term (current) drug therapy: Secondary | ICD-10-CM

## 2018-10-01 NOTE — Patient Instructions (Addendum)
Medication Instructions:   Your physician recommends that you continue on your current medications as directed. Please refer to the Current Medication list given to you today.  Labwork:  Your physician recommends that you return for lab work in: 3 months just before your next visit to check your BMET & CBC.  Testing/Procedures:  NONE  Follow-Up:  Your physician recommends that you schedule a follow-up appointment in: 3 months.  Any Other Special Instructions Will Be Listed Below (If Applicable).  Call office after your surgery to discuss restarting your anticoagulant  If you need a refill on your cardiac medications before your next appointment, please call your pharmacy.

## 2018-12-24 ENCOUNTER — Encounter (HOSPITAL_BASED_OUTPATIENT_CLINIC_OR_DEPARTMENT_OTHER): Payer: Self-pay | Admitting: *Deleted

## 2018-12-24 ENCOUNTER — Other Ambulatory Visit: Payer: Self-pay

## 2018-12-25 NOTE — H&P (Signed)
Patient's anticipated LOS is less than 2 midnights, meeting these requirements: - Younger than 2 - Lives within 1 hour of care - Has a competent adult at home to recover with post-op recover - NO history of  - Chronic pain requiring opiods  - Diabetes  - Coronary Artery Disease  - Heart failure  - Heart attack  - Stroke  - DVT/VTE  - Cardiac arrhythmia  - Respiratory Failure/COPD  - Renal failure  - Anemia  - Advanced Liver disease       David Combs is an 73 y.o. male.    Chief Complaint: left shoulder pain  HPI: Pt is a 73 y.o. male complaining of left shoulder pain for multiple years. Pain had continually increased since the beginning. X-rays in the clinic show rotator cuff tear left shoulder. Pt has tried various conservative treatments which have failed to alleviate their symptoms, including injections and therapy. Various options are discussed with the patient. Risks, benefits and expectations were discussed with the patient. Patient understand the risks, benefits and expectations and wishes to proceed with surgery.   PCP:  Glenda Chroman, MD  D/C Plans: Home  PMH: Past Medical History:  Diagnosis Date  . Agent orange exposure   . Anxiety   . Arthritis   . Chronic lower back pain   . Coronary atherosclerosis of native coronary artery    70-80% small diagonal - medically managed, LVEF 55%  . Depression   . Dermatophytosis of nail   . ED (erectile dysfunction)   . Essential hypertension   . GERD (gastroesophageal reflux disease)   . History of colonic polyps 10/08/2017  . History of pneumonia   . Lumbago   . Mixed hyperlipidemia   . Non-ST elevation myocardial infarction (NSTEMI) ([place]) 05/2010  . Obesity   . Parkinson disease ([place])   . Paroxysmal atrial fibrillation (HCC)   . Prostatitis   . Rotator cuff tear, left   . Sleep apnea    does not use CPAP    PSH: Past Surgical History:  Procedure Laterality Date  . ANTERIOR CERVICAL  DECOMP/DISCECTOMY FUSION    . APPENDECTOMY    . BACK SURGERY    . CARDIAC CATHETERIZATION  X 3  . CATARACT EXTRACTION W/ INTRAOCULAR LENS  IMPLANT, BILATERAL Bilateral   . CHOLECYSTECTOMY OPEN  05/2014   rt upper quadrant pain   . COLONOSCOPY N/A 11/29/2017   Procedure: COLONOSCOPY;  Surgeon: Rogene Houston, MD;  [facility];  Service: Endoscopy;  Laterality: N/A;  8:30  . ESOPHAGOGASTRODUODENOSCOPY N/A 12/02/2014   Procedure: ESOPHAGOGASTRODUODENOSCOPY (EGD);  Surgeon: Rogene Houston, MD;  [facility];  Service: Endoscopy;  Laterality: N/A;  200  . FRACTURE SURGERY    . KNEE ARTHROSCOPY Right   . LEFT HEART CATH AND CORONARY ANGIOGRAPHY N/A 02/12/2018   Procedure: LEFT HEART CATH AND CORONARY ANGIOGRAPHY;  Surgeon: Troy Sine, MD;  Location: [facility];  Service: Cardiovascular;  Laterality: N/A;  . LUMBAR [place]    . POLYPECTOMY  11/29/2017   Procedure: POLYPECTOMY;  Surgeon: Rogene Houston, MD;  [facility];  Service: Endoscopy;;  transverse  . TONSILLECTOMY  1958  . WRIST FRACTURE SURGERY Left 1994    Social History:  reports that he quit smoking about 16 years ago. His smoking use included cigarettes. He has a 40.00 pack-year smoking history. He has never used smokeless tobacco. He reports previous alcohol use. He reports that he  does not use drugs.  Allergies:  Allergies  Allergen Reactions  . Iodinated Diagnostic Agents Swelling  . Sulfa Drugs Cross Reactors Itching    Medications: No current facility-administered medications for this encounter.    Current Outpatient Medications  Medication Sig Dispense Refill  . aspirin EC 81 MG tablet Take 1 tablet (81 mg total) by mouth daily.    . carbidopa-levodopa (SINEMET CR) 50-200 MG tablet Take 1 tablet by mouth at bedtime.    . carbidopa-levodopa (SINEMET IR) 25-250 MG tablet Take 2 tablets by mouth 3 (three) times daily.     . carboxymethylcellulose (REFRESH PLUS) 0.5 %  SOLN Place 1 drop into both eyes 3 (three) times daily as needed (for dry eyes).     . Cholecalciferol (VITAMIN D3) 1000 units CAPS Take 1,000 Units by mouth daily.     Marland Kitchen docusate sodium (COLACE) 100 MG capsule Take 100 mg by mouth daily.    Marland Kitchen glucosamine-chondroitin 500-400 MG tablet Take 1 tablet by mouth 2 (two) times daily.     Marland Kitchen loratadine (CLARITIN) 10 MG tablet Take 10 mg by mouth daily.    . Melatonin 3 MG TABS Take 6 mg by mouth.    . memantine (NAMENDA) 10 MG tablet Take 10 mg by mouth 2 (two) times daily.    . metoprolol tartrate (LOPRESSOR) 25 MG tablet Take 0.5 tablets (12.5 mg total) by mouth 2 (two) times daily. 30 tablet 0  . omeprazole (PRILOSEC) 20 MG capsule Take 20 mg by mouth 2 (two) times daily before a meal.     . polyethylene glycol powder (GLYCOLAX/MIRALAX) powder Take 8.5 g by mouth daily as needed. (Patient taking differently: Take 17 g by mouth daily as needed for mild constipation. ) 255 g 0  . rosuvastatin (CRESTOR) 5 MG tablet Take 2.5 mg by mouth daily.     . sildenafil (VIAGRA) 50 MG tablet Take 50 mg by mouth daily as needed for erectile dysfunction.    Marland Kitchen acetaminophen (TYLENOL) 325 MG tablet Take 650 mg by mouth every 6 (six) hours as needed for moderate pain or headache.     . nitroGLYCERIN (NITROSTAT) 0.4 MG SL tablet Place 1 tablet (0.4 mg total) under the tongue every 5 (five) minutes as needed for chest pain. 75 tablet 3    No results found for this or any previous visit (from the past 48 hour(s)). No results found.  ROS: Pain with rom of the left upper extremity  Physical Exam: Alert and oriented 73 y.o. male in no acute distress Cranial nerves 2-12 intact Cervical spine: full rom with no tenderness, nv intact distally Chest: active breath sounds bilaterally, no wheeze rhonchi or rales Heart: regular rate and rhythm, no murmur Abd: non tender non distended with active bowel sounds Hip is stable with rom  Left shoulder with painful rom Strength  is limited with ER and IR No rashes or edema distally  Assessment/Plan Assessment: left shoulder rotator cuff tear  Plan:  Patient will undergo a left shoulder rotator cuff repair by Dr. Veverly Fells at [facility]. Risks benefits and expectations were discussed with the patient. Patient understand risks, benefits and expectations and wishes to proceed. Preoperative templating of the joint replacement has been completed, documented, and submitted to the Operating Room personnel in order to optimize intra-operative equipment management.   Merla Riches PA-C, MPAS [facility] is now [facility] [address] [place], [place], [place] [address] Phone: [phone] [email or web address] Facebook  Geneticist, molecular  Twitter

## 2018-12-26 ENCOUNTER — Other Ambulatory Visit (HOSPITAL_COMMUNITY)
Admission: RE | Admit: 2018-12-26 | Discharge: 2018-12-26 | Disposition: A | Discharge status: Home / Self Care | Payer: MEDICARE | Source: Ambulatory Visit | Attending: Orthopedic Surgery | Admitting: Orthopedic Surgery

## 2018-12-26 DIAGNOSIS — Z1159 Encounter for screening for other viral diseases: Secondary | ICD-10-CM | POA: Diagnosis not present

## 2018-12-27 LAB — NOVEL CORONAVIRUS, NAA (HOSP ORDER, SEND-OUT TO REF LAB; TAT 18-24 HRS): SARS-CoV-2, NAA: NOT DETECTED

## 2018-12-30 ENCOUNTER — Encounter (HOSPITAL_BASED_OUTPATIENT_CLINIC_OR_DEPARTMENT_OTHER)
Admission: RE | Disposition: A | Discharge status: Home / Self Care | Payer: Self-pay | Source: Home / Self Care | Attending: Orthopedic Surgery

## 2018-12-30 ENCOUNTER — Encounter (HOSPITAL_BASED_OUTPATIENT_CLINIC_OR_DEPARTMENT_OTHER): Payer: Self-pay | Admitting: Certified Registered"

## 2018-12-30 ENCOUNTER — Ambulatory Visit (HOSPITAL_BASED_OUTPATIENT_CLINIC_OR_DEPARTMENT_OTHER): Payer: No Typology Code available for payment source | Admitting: Anesthesiology

## 2018-12-30 ENCOUNTER — Ambulatory Visit (HOSPITAL_BASED_OUTPATIENT_CLINIC_OR_DEPARTMENT_OTHER)
Admission: RE | Admit: 2018-12-30 | Discharge: 2018-12-30 | Disposition: A | Discharge status: Home / Self Care | Payer: No Typology Code available for payment source | Attending: Orthopedic Surgery | Admitting: Orthopedic Surgery

## 2018-12-30 ENCOUNTER — Other Ambulatory Visit: Payer: Self-pay

## 2018-12-30 DIAGNOSIS — M19012 Primary osteoarthritis, left shoulder: Secondary | ICD-10-CM | POA: Insufficient documentation

## 2018-12-30 DIAGNOSIS — Z882 Allergy status to sulfonamides status: Secondary | ICD-10-CM | POA: Insufficient documentation

## 2018-12-30 DIAGNOSIS — G4730 Sleep apnea, unspecified: Secondary | ICD-10-CM | POA: Diagnosis not present

## 2018-12-30 DIAGNOSIS — G20 Parkinson's disease: Secondary | ICD-10-CM | POA: Insufficient documentation

## 2018-12-30 DIAGNOSIS — F329 Major depressive disorder, single episode, unspecified: Secondary | ICD-10-CM | POA: Diagnosis not present

## 2018-12-30 DIAGNOSIS — Z79899 Other long term (current) drug therapy: Secondary | ICD-10-CM | POA: Insufficient documentation

## 2018-12-30 DIAGNOSIS — N529 Male erectile dysfunction, unspecified: Secondary | ICD-10-CM | POA: Insufficient documentation

## 2018-12-30 DIAGNOSIS — I480 Paroxysmal atrial fibrillation: Secondary | ICD-10-CM | POA: Diagnosis not present

## 2018-12-30 DIAGNOSIS — Z7982 Long term (current) use of aspirin: Secondary | ICD-10-CM | POA: Diagnosis not present

## 2018-12-30 DIAGNOSIS — Z6836 Body mass index (BMI) 36.0-36.9, adult: Secondary | ICD-10-CM | POA: Diagnosis not present

## 2018-12-30 DIAGNOSIS — I2510 Atherosclerotic heart disease of native coronary artery without angina pectoris: Secondary | ICD-10-CM | POA: Diagnosis not present

## 2018-12-30 DIAGNOSIS — M75102 Unspecified rotator cuff tear or rupture of left shoulder, not specified as traumatic: Secondary | ICD-10-CM | POA: Insufficient documentation

## 2018-12-30 DIAGNOSIS — X58XXXA Exposure to other specified factors, initial encounter: Secondary | ICD-10-CM | POA: Diagnosis not present

## 2018-12-30 DIAGNOSIS — I10 Essential (primary) hypertension: Secondary | ICD-10-CM | POA: Insufficient documentation

## 2018-12-30 DIAGNOSIS — K219 Gastro-esophageal reflux disease without esophagitis: Secondary | ICD-10-CM | POA: Insufficient documentation

## 2018-12-30 DIAGNOSIS — Z87891 Personal history of nicotine dependence: Secondary | ICD-10-CM | POA: Insufficient documentation

## 2018-12-30 DIAGNOSIS — I252 Old myocardial infarction: Secondary | ICD-10-CM | POA: Diagnosis not present

## 2018-12-30 DIAGNOSIS — S43432A Superior glenoid labrum lesion of left shoulder, initial encounter: Secondary | ICD-10-CM | POA: Insufficient documentation

## 2018-12-30 HISTORY — PX: SHOULDER ARTHROSCOPY WITH ROTATOR CUFF REPAIR AND OPEN BICEPS TENODESIS: SHX6677

## 2018-12-30 HISTORY — DX: Unspecified rotator cuff tear or rupture of left shoulder, not specified as traumatic: M75.102

## 2018-12-30 SURGERY — SHOULDER ARTHROSCOPY WITH ROTATOR CUFF REPAIR AND OPEN BICEPS TENODESIS
Anesthesia: General | Site: Shoulder | Laterality: Left

## 2018-12-30 MED ORDER — ACETAMINOPHEN 500 MG PO TABS
1000.0000 mg | ORAL_TABLET | Freq: Once | ORAL | Status: AC
  Administered 2018-12-30: 1000 mg via ORAL

## 2018-12-30 MED ORDER — FENTANYL CITRATE (PF) 100 MCG/2ML IJ SOLN
50.0000 ug | INTRAMUSCULAR | Status: DC | PRN
  Administered 2018-12-30: 50 ug via INTRAVENOUS

## 2018-12-30 MED ORDER — LACTATED RINGERS IV SOLN
INTRAVENOUS | Status: DC
  Administered 2018-12-30: 10:00:00 via INTRAVENOUS

## 2018-12-30 MED ORDER — ONDANSETRON HCL 4 MG/2ML IJ SOLN
INTRAMUSCULAR | Status: DC | PRN
  Administered 2018-12-30: 4 mg via INTRAVENOUS

## 2018-12-30 MED ORDER — FENTANYL CITRATE (PF) 100 MCG/2ML IJ SOLN
INTRAMUSCULAR | Status: AC
  Filled 2018-12-30: qty 2

## 2018-12-30 MED ORDER — OXYCODONE-ACETAMINOPHEN 5-325 MG PO TABS: 1.0000 | ORAL_TABLET | ORAL | 0 refills | Status: DC | PRN

## 2018-12-30 MED ORDER — EPHEDRINE SULFATE-NACL 50-0.9 MG/10ML-% IV SOSY
PREFILLED_SYRINGE | INTRAVENOUS | Status: DC | PRN
  Administered 2018-12-30: 15 mg via INTRAVENOUS
  Administered 2018-12-30 (×2): 5 mg via INTRAVENOUS

## 2018-12-30 MED ORDER — EPHEDRINE 5 MG/ML INJ
INTRAVENOUS | Status: AC
  Filled 2018-12-30: qty 10

## 2018-12-30 MED ORDER — CHLORHEXIDINE GLUCONATE 4 % EX LIQD: 60.0000 mL | Freq: Once | CUTANEOUS | Status: DC

## 2018-12-30 MED ORDER — CEFAZOLIN SODIUM-DEXTROSE 2-4 GM/100ML-% IV SOLN
INTRAVENOUS | Status: AC
  Filled 2018-12-30: qty 100

## 2018-12-30 MED ORDER — SCOPOLAMINE 1 MG/3DAYS TD PT72: 1.0000 | MEDICATED_PATCH | Freq: Once | TRANSDERMAL | Status: DC | PRN

## 2018-12-30 MED ORDER — DEXAMETHASONE SODIUM PHOSPHATE 10 MG/ML IJ SOLN
INTRAMUSCULAR | Status: DC | PRN
  Administered 2018-12-30: 5 mg via INTRAVENOUS

## 2018-12-30 MED ORDER — BUPIVACAINE-EPINEPHRINE (PF) 0.25% -1:200000 IJ SOLN
INTRAMUSCULAR | Status: AC
  Filled 2018-12-30: qty 30

## 2018-12-30 MED ORDER — 0.9 % SODIUM CHLORIDE (POUR BTL) OPTIME
TOPICAL | Status: DC | PRN
  Administered 2018-12-30: 4000 mL

## 2018-12-30 MED ORDER — METHOCARBAMOL 500 MG PO TABS: 500.0000 mg | ORAL_TABLET | Freq: Four times a day (QID) | ORAL | 1 refills | Status: DC | PRN

## 2018-12-30 MED ORDER — CEFAZOLIN SODIUM-DEXTROSE 2-4 GM/100ML-% IV SOLN
2.0000 g | INTRAVENOUS | Status: AC
  Administered 2018-12-30: 2 g via INTRAVENOUS

## 2018-12-30 MED ORDER — PROPOFOL 10 MG/ML IV BOLUS
INTRAVENOUS | Status: DC | PRN
  Administered 2018-12-30: 190 mg via INTRAVENOUS

## 2018-12-30 MED ORDER — ACETAMINOPHEN 500 MG PO TABS
ORAL_TABLET | ORAL | Status: AC
  Filled 2018-12-30: qty 2

## 2018-12-30 MED ORDER — MIDAZOLAM HCL 2 MG/2ML IJ SOLN
INTRAMUSCULAR | Status: AC
  Filled 2018-12-30: qty 2

## 2018-12-30 MED ORDER — BUPIVACAINE-EPINEPHRINE 0.25% -1:200000 IJ SOLN
INTRAMUSCULAR | Status: DC | PRN
  Administered 2018-12-30: 10 mL

## 2018-12-30 MED ORDER — ROCURONIUM BROMIDE 10 MG/ML (PF) SYRINGE
PREFILLED_SYRINGE | INTRAVENOUS | Status: DC | PRN
  Administered 2018-12-30: 70 mg via INTRAVENOUS

## 2018-12-30 MED ORDER — FENTANYL CITRATE (PF) 100 MCG/2ML IJ SOLN: 25.0000 ug | INTRAMUSCULAR | Status: DC | PRN

## 2018-12-30 MED ORDER — MIDAZOLAM HCL 2 MG/2ML IJ SOLN
1.0000 mg | INTRAMUSCULAR | Status: DC | PRN
  Administered 2018-12-30: 2 mg via INTRAVENOUS

## 2018-12-30 MED ORDER — BUPIVACAINE LIPOSOME 1.3 % IJ SUSP
INTRAMUSCULAR | Status: DC | PRN
  Administered 2018-12-30: 10 mL via PERINEURAL

## 2018-12-30 MED ORDER — PROMETHAZINE HCL 25 MG/ML IJ SOLN: 6.2500 mg | INTRAMUSCULAR | Status: DC | PRN

## 2018-12-30 MED ORDER — DEXAMETHASONE SODIUM PHOSPHATE 10 MG/ML IJ SOLN
INTRAMUSCULAR | Status: AC
  Filled 2018-12-30: qty 1

## 2018-12-30 MED ORDER — SUGAMMADEX SODIUM 200 MG/2ML IV SOLN
INTRAVENOUS | Status: DC | PRN
  Administered 2018-12-30: 250 mg via INTRAVENOUS

## 2018-12-30 MED ORDER — BUPIVACAINE HCL (PF) 0.25 % IJ SOLN
INTRAMUSCULAR | Status: DC | PRN
  Administered 2018-12-30: 15 mL

## 2018-12-30 SURGICAL SUPPLY — 72 items
ANCHOR ALL SUT RC W2 1.3 RIB (Anchor) ×2 IMPLANT
ANCHOR ALL-SUT FLEX 1.3 Y-KNOT (Anchor) ×4 IMPLANT
ANCHOR ALL-SUT RC W2 1.3 RIB (Anchor) ×2 IMPLANT
BENZOIN TINCTURE PRP APPL 2/3 (GAUZE/BANDAGES/DRESSINGS) IMPLANT
BIT DRILL 1.3M DISPOSABLE (BIT) ×4 IMPLANT
BLADE AVERAGE 25MMX9MM (BLADE) ×1
BLADE AVERAGE 25X9 (BLADE) ×3 IMPLANT
BLADE CLIPPER SURG (BLADE) IMPLANT
BLADE EXCALIBUR 4.0MM X 13CM (MISCELLANEOUS)
BLADE EXCALIBUR 4.0X13 (MISCELLANEOUS) IMPLANT
BLADE SURG 15 STRL LF DISP TIS (BLADE) ×2 IMPLANT
BLADE SURG 15 STRL SS (BLADE) ×2
BURR OVAL 8 FLU 4.0MM X 13CM (MISCELLANEOUS) ×1
BURR OVAL 8 FLU 4.0X13 (MISCELLANEOUS) ×3 IMPLANT
BURR OVAL 8 FLU 5.0MM X 13CM (MISCELLANEOUS)
BURR OVAL 8 FLU 5.0X13 (MISCELLANEOUS) IMPLANT
CLOSURE WOUND 1/2 X4 (GAUZE/BANDAGES/DRESSINGS) ×2
COVER WAND RF STERILE (DRAPES) IMPLANT
CUTTER BONE 4.0MM X 13CM (MISCELLANEOUS) ×4 IMPLANT
DECANTER SPIKE VIAL GLASS SM (MISCELLANEOUS) IMPLANT
DISSECTOR  3.8MM X 13CM (MISCELLANEOUS)
DISSECTOR 3.8MM X 13CM (MISCELLANEOUS) IMPLANT
DRAPE IMP U-DRAPE 54X76 (DRAPES) ×4 IMPLANT
DRAPE INCISE IOBAN 66X45 STRL (DRAPES) ×4 IMPLANT
DRAPE STERI 35X30 U-POUCH (DRAPES) ×4 IMPLANT
DRAPE U-SHAPE 47X51 STRL (DRAPES) ×4 IMPLANT
DRAPE U-SHAPE 76X120 STRL (DRAPES) ×8 IMPLANT
DRSG EMULSION OIL 3X3 NADH (GAUZE/BANDAGES/DRESSINGS) ×4 IMPLANT
DURAPREP 26ML APPLICATOR (WOUND CARE) ×4 IMPLANT
ELECT NEEDLE TIP 2.8 STRL (NEEDLE) ×4 IMPLANT
ELECT REM PT RETURN 9FT ADLT (ELECTROSURGICAL) ×4
ELECTRODE REM PT RTRN 9FT ADLT (ELECTROSURGICAL) ×2 IMPLANT
GAUZE SPONGE 4X4 12PLY STRL (GAUZE/BANDAGES/DRESSINGS) ×4 IMPLANT
GLOVE BIOGEL PI IND STRL 7.5 (GLOVE) ×2 IMPLANT
GLOVE BIOGEL PI IND STRL 8.5 (GLOVE) ×2 IMPLANT
GLOVE BIOGEL PI INDICATOR 7.5 (GLOVE) ×2
GLOVE BIOGEL PI INDICATOR 8.5 (GLOVE) ×2
GLOVE ORTHO TXT STRL SZ7.5 (GLOVE) ×4 IMPLANT
GOWN STRL REUS W/ TWL LRG LVL3 (GOWN DISPOSABLE) ×6 IMPLANT
GOWN STRL REUS W/TWL LRG LVL3 (GOWN DISPOSABLE) ×6
NEEDLE 1/2 CIR MAYO (NEEDLE) ×4 IMPLANT
NEEDLE MAYO 6 CRC TAPER PT (NEEDLE) ×4 IMPLANT
NS IRRIG 1000ML POUR BTL (IV SOLUTION) ×4 IMPLANT
PACK ARTHROSCOPY DSU (CUSTOM PROCEDURE TRAY) ×4 IMPLANT
PACK BASIN DAY SURGERY FS (CUSTOM PROCEDURE TRAY) ×4 IMPLANT
PAD ORTHO SHOULDER 7X19 LRG (SOFTGOODS) ×4 IMPLANT
PENCIL BUTTON HOLSTER BLD 10FT (ELECTRODE) ×4 IMPLANT
PORT APPOLLO RF 90DEGREE MULTI (SURGICAL WAND) ×4 IMPLANT
SLING ARM FOAM STRAP LRG (SOFTGOODS) IMPLANT
SLING ARM IMMOBILIZER LRG (SOFTGOODS) IMPLANT
SLING ARM IMMOBILIZER MED (SOFTGOODS) IMPLANT
SLING ARM MED ADULT FOAM STRAP (SOFTGOODS) IMPLANT
SLING ARM XL FOAM STRAP (SOFTGOODS) IMPLANT
SPONGE LAP 4X18 RFD (DISPOSABLE) ×8 IMPLANT
STAPLER VISISTAT 35W (STAPLE) IMPLANT
STRIP CLOSURE SKIN 1/2X4 (GAUZE/BANDAGES/DRESSINGS) ×6 IMPLANT
SUCTION FRAZIER HANDLE 10FR (MISCELLANEOUS)
SUCTION TUBE FRAZIER 10FR DISP (MISCELLANEOUS) IMPLANT
SUT BONE WAX W31G (SUTURE) ×4 IMPLANT
SUT HI-FI 2 STRAND C-2 40 (SUTURE) ×8 IMPLANT
SUT MNCRL AB 4-0 PS2 18 (SUTURE) ×4 IMPLANT
SUT VIC AB 0 CT1 18XCR BRD 8 (SUTURE) IMPLANT
SUT VIC AB 0 CT1 8-18 (SUTURE)
SUT VIC AB 1 CT1 27 (SUTURE) ×2
SUT VIC AB 1 CT1 27XBRD ANBCTR (SUTURE) ×2 IMPLANT
SUT VIC AB 2-0 CT1 27 (SUTURE) ×2
SUT VIC AB 2-0 CT1 TAPERPNT 27 (SUTURE) ×2 IMPLANT
SUT VICRYL 0 CT-2 (SUTURE) ×4 IMPLANT
SYR BULB 3OZ (MISCELLANEOUS) ×4 IMPLANT
TOWEL GREEN STERILE FF (TOWEL DISPOSABLE) ×4 IMPLANT
TUBING ARTHROSCOPY IRRIG 16FT (MISCELLANEOUS) ×4 IMPLANT
YANKAUER SUCT BULB TIP NO VENT (SUCTIONS) ×4 IMPLANT

## 2018-12-30 NOTE — Brief Op Note (Signed)
12/30/2018  12:19 PM  PATIENT:  David Combs  73 y.o. male  PRE-OPERATIVE DIAGNOSIS:  Left shoulder rotator cuff tear, SLAP tear, AC joint degenerative joint disease  POST-OPERATIVE DIAGNOSIS:  Same   PROCEDURE:  Left shoulder arthroscopy with extensive intra-articular debridement of SLAP tear, biceps tenotomy, arthroscopic subacromial decompression, mini-open RCR, biceps tenodesis, open [place]ford procedure3  SURGEON:  Surgeon(s) and Role:    Netta Cedars, MD - Primary  PHYSICIAN ASSISTANT:   ASSISTANTS: Ventura Bruns, PA-C   ANESTHESIA:   regional and general  EBL:  50 mL   BLOOD ADMINISTERED:none  DRAINS: none   LOCAL MEDICATIONS USED:  MARCAINE     SPECIMEN:  No Specimen and Mastectomy with SLN  DISPOSITION OF SPECIMEN:  N/A  COUNTS:  YES  TOURNIQUET:  * No tourniquets in log *  DICTATION: .Other Dictation: Dictation Number [id number]  PLAN OF CARE: Discharge to home after PACU  PATIENT DISPOSITION:  PACU - hemodynamically stable.   Delay start of Pharmacological VTE agent (>24hrs) due to surgical blood loss or risk of bleeding: not applicable

## 2018-12-30 NOTE — Progress Notes (Signed)
Assisted Dr. Singer with left, ultrasound guided, interscalene  block. Side rails up, monitors on throughout procedure. See vital signs in flow sheet. Tolerated Procedure well.  

## 2018-12-30 NOTE — Interval H&P Note (Signed)
History and Physical Interval Note:  12/30/2018 12:31 PM  David Combs  has presented today for surgery, with the diagnosis of Left shoulder rotator cuff tear with degenerative joint disease.  The various methods of treatment have been discussed with the patient and family. After consideration of risks, benefits and other options for treatment, the patient has consented to  Procedure(s): LEFT SHOULDER ARTHROSCOPY WITH ROTATOR CUFF REPAIR (Left) as a surgical intervention.  The patient's history has been reviewed, patient examined, no change in status, stable for surgery.  I have reviewed the patient's chart and labs.  Questions were answered to the patient's satisfaction.     Augustin Schooling

## 2018-12-30 NOTE — Discharge Instructions (Signed)
NO TYLENOL BEFORE 4PM TODAY!      Ice to the shoulder constantly.  Keep the incision covered and clean and dry for one week, then ok to get it wet in the shower.  Do exercise as instructed several times per day. Pendulums, gentle lap slides, and also gentle rotation (hug and hitch hike) arm is at your side on the pillow and then you rotate the arm in and out like a swinging gate  DO NOT reach behind your back or push up out of a chair with the operative arm.  Use a sling while you are up and around for comfort, may remove while seated, but hug a pillow instead.  Keep pillow propped behind the operative elbow.  Follow up with Dr Veverly Fells in two weeks in the office, call [phone] for appt    Regional Anesthesia Blocks  1. Numbness or the inability to move the "blocked" extremity may last from 3-48 hours after placement. The length of time depends on the medication injected and your individual response to the medication. If the numbness is not going away after 48 hours, call your surgeon.  2. The extremity that is blocked will need to be protected until the numbness is gone and the  Strength has returned. Because you cannot feel it, you will need to take extra care to avoid injury. Because it may be weak, you may have difficulty moving it or using it. You may not know what position it is in without looking at it while the block is in effect.  3. For blocks in the legs and feet, returning to weight bearing and walking needs to be done carefully. You will need to wait until the numbness is entirely gone and the strength has returned. You should be able to move your leg and foot normally before you try and bear weight or walk. You will need someone to be with you when you first try to ensure you do not fall and possibly risk injury.  4. Bruising and tenderness at the needle site are common side effects and will resolve in a few days.  5. Persistent numbness or new problems with movement  should be communicated to the surgeon or the [place] [phone] [place] [phone]).        Information for Discharge Teaching: EXPAREL (bupivacaine liposome injectable suspension)   Your surgeon or anesthesiologist gave you EXPAREL(bupivacaine) to help control your pain after surgery.   EXPAREL is a local anesthetic that provides pain relief by numbing the tissue around the surgical site.  EXPAREL is designed to release pain medication over time and can control pain for up to 72 hours.  Depending on how you respond to EXPAREL, you may require less pain medication during your recovery.  Possible side effects:  Temporary loss of sensation or ability to move in the area where bupivacaine was injected.  Nausea, vomiting, constipation  Rarely, numbness and tingling in your mouth or lips, lightheadedness, or anxiety may occur.  Call your doctor right away if you think you may be experiencing any of these sensations, or if you have other questions regarding possible side effects.  Follow all other discharge instructions given to you by your surgeon or nurse. Eat a healthy diet and drink plenty of water or other fluids.  If you return to the hospital for any reason within 96 hours following the administration of EXPAREL, it is important for health care providers to know that you have  received this anesthetic. A teal colored band has been placed on your arm with the date, time and amount of EXPAREL you have received in order to alert and inform your health care providers. Please leave this armband in place for the full 96 hours following administration, and then you may remove the band.        Post Anesthesia Home Care Instructions  Activity: Get plenty of rest for the remainder of the day. A responsible individual must stay with you for 24 hours following the procedure.  For the next 24 hours, DO NOT: -Drive a car -Conservation officer, nature -Drink alcoholic beverages -Take any medication unless instructed by your physician -Make any legal decisions or sign important papers.  Meals: Start with liquid foods such as gelatin or soup. Progress to regular foods as tolerated. Avoid greasy, spicy, heavy foods. If nausea and/or vomiting occur, drink only clear liquids until the nausea and/or vomiting subsides. Call your physician if vomiting continues.  Special Instructions/Symptoms: Your throat may feel dry or sore from the anesthesia or the breathing tube placed in your throat during surgery. If this causes discomfort, gargle with warm salt water. The discomfort should disappear within 24 hours.  If you had a scopolamine patch placed behind your ear for the management of post- operative nausea and/or vomiting:  1. The medication in the patch is effective for 72 hours, after which it should be removed.  Wrap patch in a tissue and discard in the trash. Wash hands thoroughly with soap and water. 2. You may remove the patch earlier than 72 hours if you experience unpleasant side effects which may include dry mouth, dizziness or visual disturbances. 3. Avoid touching the patch. Wash your hands with soap and water after contact with the patch.

## 2018-12-30 NOTE — Op Note (Signed)
NAME: David Combs, David Combs MEDICAL RECORD [id number] ACCOUNT [id number] DATE OF BIRTH:1945/10/21 FACILITY: [facility] LOCATION: [facility] PHYSICIAN:STEVEN R. Cecil Vandyke, MD  OPERATIVE REPORT  DATE OF PROCEDURE:  12/30/2018  PREOPERATIVE DIAGNOSIS:  Left shoulder rotator cuff tear, superior labrum anterior and posterior (SLAP) tear and acromioclavicular joint arthritis.  POSTOPERATIVE DIAGNOSIS:  Left shoulder rotator cuff tear, superior labrum anterior and posterior (SLAP) tear and acromioclavicular joint arthritis.  PROCEDURE PERFORMED:  Left shoulder arthroscopy with extensive intraarticular debridement of torn superior labrum anterior to posterior as well as arthroscopic biceps tenotomy followed by arthroscopic subacromial decompression, mini open rotator cuff  repair, biceps tenodesis in the groove and open distal clavicle resection.  SURGEON:  Esmond Plants, MD  ASSISTANT:  Darol Destine, [place], who was scrubbed during the entire procedure and necessary for satisfactory completion of surgery.  ANESTHESIA:  General anesthesia was used plus interscalene block.  ESTIMATED BLOOD LOSS:  Less than 100 mL.  FLUID REPLACEMENT:  1500 mL crystalloid.  INSTRUMENT COUNTS:  Correct.  COMPLICATIONS:  No complications.  ANTIBIOTICS:  Perioperative antibiotics were given.  INDICATIONS:  The patient is a 73 year old male with worsening left shoulder pain and dysfunction secondary to an MRI-documented rotator cuff tear.  The patient also has advanced AC arthritis with outlet impingement and suspected SLAP tear.  Having  failed conservative management, the patient presents for operative treatment to restore function and eliminate pain to the shoulder.  Informed consent obtained.  DESCRIPTION OF PROCEDURE:  After an adequate level of anesthesia was achieved, the patient was positioned in modified beach chair position.  Left shoulder correctly identified and sterilely prepped and draped in the usual  manner.  Time-out called,  verifying correct patient, correct site.  We initiated surgery using standard portals including anterior, posterior and lateral portals.  We identified significant tearing of the superior labrum compromising the biceps anchor.  We performed a biceps  tenotomy followed by labral debridement back to stable labral rim.  Minimal chondromalacia in the glenohumeral joint and subscap intact.  Rotator cuff was torn with involvement of the supraspinatus and infraspinatus tendons.  The teres minor was intact  as visualized from the anterior portal.  Final lavage of the shoulder joint to make sure we had all loose debris out.  We had nice debridement of that superior labrum, and the biceps was released.  We placed the scope in the subacromial space.  A  thorough bursectomy and acromioplasty was performed, creating a type 1 acromial shape with a butcher-block technique utilizing high-speed bur.  We did release the CA ligament.  We had nice decompression of rotator cuff outlet.  At this point, we directed  our attention towards the open portion of the procedure.  We made a small saber incision overlying the AC joint.  Dissection down through subcutaneous tissues using Bovie electrocautery.  Identified the deltotrapezial fascia, incised in line with the  distal clavicle.  Subperiosteal dissection of distal clavicle performed followed by excision of distal 2 mm of bone using an oscillating saw.  We had nice decompression of that [facility] joint.  I was able to verify the anterior and posterior AC ligaments were  intact.  We applied bone wax to the cut end of the clavicle.  We irrigated thoroughly.  We then removed dorsal osteophytes off the acromion at the [facility] joint margin.  Next, we repaired the deltotrapezial fascia anatomically with 0 Vicryl suture followed by  2-0 Vicryl for subcutaneous closure and 4-0 Monocryl for skin.  We then addressed  the biceps and the rotator cuff through a mini open  incision, starting at the anterolateral border of the acromion extending distally for about 3-4 cm in the raphe between  the anterior and lateral heads of the deltoid.  Dissection down through subcutaneous tissues using Bovie.  We placed an Arthrex retractor.  I was able to visualize the biceps tendon, delivered that out of the wound and then tagged that with #2 Hi-Fi  suture, and we used a running baseball stitch-type suture to reinforce the biceps in the region of the tenodesis.  We cut away the remaining biceps tendon.  At this point, we prepared the floor of the biceps groove with a needle-tip Bovie and a Freer  elevator to get down to bleeding bone.  We then placed a Y-Knot Flex anchor through the floor of the biceps groove and brought that suture up through the biceps tendon in a mattress fashion.  We were able to compress the tendon down nicely into the  biceps groove neutral with the elbow at 90 degrees in neutral tension.  We next took the whipstitch sutures up through the rotator interval and tied over a soft tissue bridge, incorporating part of the subscap.  Thus, we had double anchoring of the  biceps.  At this point, we addressed the rotator cuff tear.  This was a U-shaped tear involving supraspinatus and the anterior half of the infraspinatus tendons.  We mobilized the tendon using a cuff grasper and then placing #2 Hi-Fi suture in the free  edge of the tendon.  We were able to mobilize both posterior to anterior but also medial to lateral.  We did margin convergence medially and then made sure that the delaminated portion was incorporated into the repair.  We prepared the greater tuberosity  with a rongeur and curette to get to bleeding bone.  We placed a Y-Knot RC anchor loaded with ribbon at the articular margin and brought that up in a mattress fashion, repairing the anterior portion of the tendon to the posterior aspect of the tendon.   We had an anatomic repair.  We were able to take the  lateral sutures down through drill holes in the bone and tie it over the lateral bone bridge for a double-row repair, watertight, and everything moving together as a unit as I ranged the shoulder with  no impingement.  We irrigated thoroughly again and then repaired the deltoid anatomically with 0 Vicryl suture followed by 2-0 Vicryl for subcutaneous closure and 4-0 Monocryl for skin and portals.  Steri-Strips applied followed by a sterile dressing.   The patient tolerated surgery well.  LN/NUANCE  D:12/30/2018 T:12/30/2018 JOB:[id number]

## 2018-12-30 NOTE — Anesthesia Procedure Notes (Signed)
Procedure Name: Intubation Date/Time: 12/30/2018 10:32 AM Performed by: Myna Bright, CRNA Pre-anesthesia Checklist: Patient identified, Emergency Drugs available, Suction available and Patient being monitored Patient Re-evaluated:Patient Re-evaluated prior to induction Oxygen Delivery Method: Circle system utilized Preoxygenation: Pre-oxygenation with 100% oxygen Induction Type: IV induction Ventilation: Mask ventilation without difficulty Laryngoscope Size: Mac and 4 Grade View: Grade II Tube type: Oral Tube size: 8.0 mm Number of attempts: 1 Airway Equipment and Method: Stylet Placement Confirmation: ETT inserted through vocal cords under direct vision,  positive ETCO2 and breath sounds checked- equal and bilateral Secured at: 22 cm Tube secured with: Tape Dental Injury: Teeth and Oropharynx as per pre-operative assessment

## 2018-12-30 NOTE — Anesthesia Postprocedure Evaluation (Signed)
Anesthesia Post Note  Patient: David Combs  Procedure(s) Performed: LEFT SHOULDER ARTHROSCOPY WITH ROTATOR CUFF REPAIR (Left Shoulder)     Patient location during evaluation: PACU Anesthesia Type: General Level of consciousness: sedated Pain management: pain level controlled Vital Signs Assessment: post-procedure vital signs reviewed and stable Respiratory status: spontaneous breathing and respiratory function stable Cardiovascular status: stable Postop Assessment: no apparent nausea or vomiting Anesthetic complications: no    Last Vitals:  Vitals:   12/30/18 1330 12/30/18 1348  BP: (!) 160/92 (!) 142/48  Pulse: 68 63  Resp: (!) 24   Temp:  36.5 C  SpO2: 96% 97%    Last Pain:  Vitals:   12/30/18 1348  TempSrc:   PainSc: 0-No pain                 Jaycee Pelzer DANIEL

## 2018-12-30 NOTE — Anesthesia Procedure Notes (Signed)
Anesthesia Regional Block: Interscalene brachial plexus block   Pre-Anesthetic Checklist: ,, timeout performed, Correct Patient, Correct Site, Correct Laterality, Correct Procedure, Correct Position, site marked, Risks and benefits discussed,  Surgical consent,  Pre-op evaluation,  At surgeon's request and post-op pain management  Laterality: Left  Prep: chloraprep       Needles:  Injection technique: Single-shot  Needle Type: Echogenic Stimulator Needle     Needle Length: 5cm  Needle Gauge: 22     Additional Needles:   Procedures:,,,, ultrasound used (permanent image in chart),,,, #20gu IV placed  Narrative:  Start time: 12/30/2018 10:01 AM End time: 12/30/2018 10:11 AM Injection made incrementally with aspirations every 5 mL.  Performed by: Personally  Anesthesiologist: Duane Boston, MD  Additional Notes: Functioning IV was confirmed and monitors applied.  A 56mm 22ga echogenic arrow stimulator was used. Sterile prep and drape,hand hygiene and sterile gloves were used.Ultrasound guidance: relevant anatomy identified, needle position confirmed, local anesthetic spread visualized around nerve(s)., vascular puncture avoided.  Image printed for medical record.  Negative aspiration and negative test dose prior to incremental administration of local anesthetic. The patient tolerated the procedure well.

## 2018-12-30 NOTE — Transfer of Care (Signed)
Immediate Anesthesia Transfer of Care Note  Patient: David Combs  Procedure(s) Performed: LEFT SHOULDER ARTHROSCOPY WITH ROTATOR CUFF REPAIR (Left Shoulder)  Patient Location: PACU  Anesthesia Type:GA combined with regional for post-op pain  Level of Consciousness: sedated and responds to stimulation  Airway & Oxygen Therapy: Patient Spontanous Breathing and Patient connected to nasal cannula oxygen  Post-op Assessment: Report given to RN and Post -op Vital signs reviewed and stable  Post vital signs: Reviewed and stable  Last Vitals:  Vitals Value Taken Time  BP 161/82 12/30/2018 12:39 PM  Temp    Pulse 64 12/30/2018 12:43 PM  Resp 18 12/30/2018 12:43 PM  SpO2 97 % 12/30/2018 12:43 PM  Vitals shown include unvalidated device data.  Last Pain:  Vitals:   12/30/18 0949  TempSrc: Oral  PainSc: 0-No pain         Complications: No apparent anesthesia complications

## 2018-12-30 NOTE — Anesthesia Preprocedure Evaluation (Addendum)
Anesthesia Evaluation  Patient identified by MRN, date of birth, ID band Patient awake    Reviewed: Allergy & Precautions, NPO status , Patient's Chart, lab work & pertinent test results  History of Anesthesia Complications Negative for: history of anesthetic complications  Airway Mallampati: II  TM Distance: >3 FB Neck ROM: Full    Dental no notable dental hx. (+) Dental Advisory Given   Pulmonary sleep apnea , former smoker,    Pulmonary exam normal        Cardiovascular hypertension, + CAD  Normal cardiovascular exam   Prox RCA to Mid RCA lesion is 20% stenosed.  Ost 2nd Mrg lesion is 50% stenosed.  Ost 1st Diag to 1st Diag lesion is 40% stenosed.  Prox LAD lesion is 20% stenosed.   Evidence for mild coronary calcification in all coronary arteries with 20% smooth LAD stenosis, 40% mid first diagonal stenosis; 50% proximal narrowing in the circumflex marginal branch, and large dominant RCA with smooth 20% mid stenosis.   Neuro/Psych PSYCHIATRIC DISORDERS Anxiety Depression Parkinson's Ds negative neurological ROS     GI/Hepatic Neg liver ROS, GERD  ,  Endo/Other  Morbid obesity  Renal/GU negative Renal ROS     Musculoskeletal negative musculoskeletal ROS (+)   Abdominal   Peds  Hematology negative hematology ROS (+)   Anesthesia Other Findings Day of surgery medications reviewed with the patient.  Reproductive/Obstetrics                            Anesthesia Physical Anesthesia Plan  ASA: III  Anesthesia Plan: General   Post-op Pain Management:  Regional for Post-op pain   Induction: Intravenous  PONV Risk Score and Plan: 2 and Ondansetron, Dexamethasone and Diphenhydramine  Airway Management Planned: Oral ETT  Additional Equipment:   Intra-op Plan:   Post-operative Plan: Extubation in OR  Informed Consent: I have reviewed the patients History and Physical, chart,  labs and discussed the procedure including the risks, benefits and alternatives for the proposed anesthesia with the patient or authorized representative who has indicated his/her understanding and acceptance.     Dental advisory given  Plan Discussed with: CRNA, Anesthesiologist and Surgeon  Anesthesia Plan Comments:        Anesthesia Quick Evaluation

## 2019-01-01 ENCOUNTER — Encounter (HOSPITAL_BASED_OUTPATIENT_CLINIC_OR_DEPARTMENT_OTHER): Payer: Self-pay | Admitting: Orthopedic Surgery

## 2019-01-02 ENCOUNTER — Ambulatory Visit: Payer: MEDICARE | Admitting: Cardiology

## 2019-03-05 ENCOUNTER — Ambulatory Visit: Payer: MEDICARE | Admitting: Cardiology

## 2019-04-04 DIAGNOSIS — I4891 Unspecified atrial fibrillation: Secondary | ICD-10-CM | POA: Diagnosis not present

## 2019-04-04 DIAGNOSIS — Z87891 Personal history of nicotine dependence: Secondary | ICD-10-CM | POA: Diagnosis not present

## 2019-04-04 DIAGNOSIS — M545 Low back pain: Secondary | ICD-10-CM | POA: Diagnosis not present

## 2019-04-04 DIAGNOSIS — Z6835 Body mass index (BMI) 35.0-35.9, adult: Secondary | ICD-10-CM | POA: Diagnosis not present

## 2019-04-04 DIAGNOSIS — I10 Essential (primary) hypertension: Secondary | ICD-10-CM | POA: Diagnosis not present

## 2019-04-04 DIAGNOSIS — Z299 Encounter for prophylactic measures, unspecified: Secondary | ICD-10-CM | POA: Diagnosis not present

## 2019-04-04 DIAGNOSIS — G20 Parkinson's disease: Secondary | ICD-10-CM | POA: Diagnosis not present

## 2019-04-09 ENCOUNTER — Other Ambulatory Visit: Payer: Self-pay | Admitting: Cardiology

## 2019-04-09 DIAGNOSIS — Z8679 Personal history of other diseases of the circulatory system: Secondary | ICD-10-CM | POA: Diagnosis not present

## 2019-04-09 DIAGNOSIS — I10 Essential (primary) hypertension: Secondary | ICD-10-CM | POA: Diagnosis not present

## 2019-04-09 DIAGNOSIS — Z79899 Other long term (current) drug therapy: Secondary | ICD-10-CM | POA: Diagnosis not present

## 2019-04-09 LAB — BASIC METABOLIC PANEL WITH GFR
BUN: 12 mg/dL (ref 7–25)
CO2: 31 mmol/L (ref 20–32)
Calcium: 9.6 mg/dL (ref 8.6–10.3)
Chloride: 106 mmol/L (ref 98–110)
Creat: 0.85 mg/dL (ref 0.70–1.18)
GFR, Est African American: 101 mL/min/{1.73_m2} (ref >=60)
GFR, Est Non African American: 87 mL/min/{1.73_m2} (ref >=60)
Glucose, Bld: 116 mg/dL — ABNORMAL HIGH (ref 65–99)
Potassium: 5 mmol/L (ref 3.5–5.3)
Sodium: 142 mmol/L (ref 135–146)

## 2019-04-09 LAB — CBC
HCT: 44.8 % (ref 38.5–50.0)
Hemoglobin: 15.3 g/dL (ref 13.2–17.1)
MCH: 32.9 pg (ref 27.0–33.0)
MCHC: 34.2 g/dL (ref 32.0–36.0)
MCV: 96.3 fL (ref 80.0–100.0)
MPV: 11.7 fL (ref 7.5–12.5)
Platelets: 178 10*3/uL (ref 140–400)
RBC: 4.65 10*6/uL (ref 4.20–5.80)
RDW: 12 % (ref 11.0–15.0)
WBC: 6.1 10*3/uL (ref 3.8–10.8)

## 2019-04-11 DIAGNOSIS — Z23 Encounter for immunization: Secondary | ICD-10-CM | POA: Diagnosis not present

## 2019-04-15 ENCOUNTER — Other Ambulatory Visit: Payer: Self-pay

## 2019-04-15 ENCOUNTER — Ambulatory Visit (INDEPENDENT_AMBULATORY_CARE_PROVIDER_SITE_OTHER): Payer: MEDICARE | Admitting: Cardiology

## 2019-04-15 ENCOUNTER — Telehealth: Payer: Self-pay | Admitting: *Deleted

## 2019-04-15 ENCOUNTER — Encounter: Payer: Self-pay | Admitting: Cardiology

## 2019-04-15 VITALS — BP 111/72 | HR 55 | Ht 67.0 in | Wt 230.0 lb

## 2019-04-15 DIAGNOSIS — I25119 Atherosclerotic heart disease of native coronary artery with unspecified angina pectoris: Secondary | ICD-10-CM

## 2019-04-15 DIAGNOSIS — Z8679 Personal history of other diseases of the circulatory system: Secondary | ICD-10-CM

## 2019-04-15 DIAGNOSIS — E782 Mixed hyperlipidemia: Secondary | ICD-10-CM

## 2019-04-15 DIAGNOSIS — Z79899 Other long term (current) drug therapy: Secondary | ICD-10-CM | POA: Diagnosis not present

## 2019-04-15 MED ORDER — APIXABAN 5 MG PO TABS: 5.0000 mg | ORAL_TABLET | Freq: Two times a day (BID) | ORAL | 2 refills | Status: AC

## 2019-04-15 NOTE — Telephone Encounter (Signed)
Advise patient to stop aspirin when he starts eliquis.

## 2019-04-15 NOTE — Addendum Note (Signed)
Addended by: Merlene Laughter on: 04/15/2019 05:01 PM   Modules accepted: Orders

## 2019-04-15 NOTE — Patient Instructions (Addendum)
Medication Instructions:   Your physician has recommended you make the following change in your medication:  Stop aspirin   Restart eliquis 5 mg by mouth twice daily  Continue all other medications the same  Labwork:  Your physician recommends that you return for non-fasting lab work in: 3 months just before your next visit to check your BMET & CBC. Please take your ab orders with you to the lab.  Testing/Procedures:  NONE  Follow-Up:  Your physician recommends that you schedule a follow-up appointment in: 3 months.  Any Other Special Instructions Will Be Listed Below (If Applicable).  If you need a refill on your cardiac medications before your next appointment, please call your pharmacy.

## 2019-04-15 NOTE — Progress Notes (Signed)
Cardiology Office Note  Date: 04/15/2019   ID: [name], DOB 08/21/1945, MRN [id number]  PCP:  Glenda Chroman, MD  Cardiologist:  Rozann Lesches, MD Electrophysiologist:  None   Chief Complaint  Patient presents with  . Cardiac follow-up    History of Present Illness: David Combs is a 73 y.o. male last seen in March.  He is here today with his wife for a follow-up visit.  It is obvious in talking with him that he has had memory trouble, his wife likes to be present for his health care visits.  He underwent arthroscopic left shoulder surgery under general anesthesia back in June.  He is still recuperating, is working with physical therapy in [place].  He states that he has had a lot of postoperative pain, but that his range of motion has improved.  From a cardiac perspective he does not describe any angina symptoms or palpitations.  He did not resume Eliquis which we had discussed previously for stroke prophylaxis.  He remains on Crestor, Lopressor, and has as needed nitroglycerin available as well.  I personally reviewed his ECG today which shows sinus rhythm.  Past Medical History:  Diagnosis Date  . Agent orange exposure   . Anxiety   . Arthritis   . Chronic lower back pain   . Coronary atherosclerosis of native coronary artery    70-80% small diagonal - medically managed, LVEF 55%  . Depression   . Dermatophytosis of nail   . ED (erectile dysfunction)   . Essential hypertension   . GERD (gastroesophageal reflux disease)   . History of colonic polyps 10/08/2017  . History of pneumonia   . Lumbago   . Mixed hyperlipidemia   . Non-ST elevation myocardial infarction (NSTEMI) ([place]) 05/2010  . Obesity   . Parkinson disease ([place])   . Paroxysmal atrial fibrillation (HCC)   . Prostatitis   . Rotator cuff tear, left   . Sleep apnea    does not use CPAP    Past Surgical History:  Procedure Laterality Date  . ANTERIOR CERVICAL DECOMP/DISCECTOMY FUSION     . APPENDECTOMY    . BACK SURGERY    . CARDIAC CATHETERIZATION  X 3  . CATARACT EXTRACTION W/ INTRAOCULAR LENS  IMPLANT, BILATERAL Bilateral   . CHOLECYSTECTOMY OPEN  05/2014   rt upper quadrant pain   . COLONOSCOPY N/A 11/29/2017   Procedure: COLONOSCOPY;  Surgeon: Rogene Houston, MD;  [facility];  Service: Endoscopy;  Laterality: N/A;  8:30  . ESOPHAGOGASTRODUODENOSCOPY N/A 12/02/2014   Procedure: ESOPHAGOGASTRODUODENOSCOPY (EGD);  Surgeon: Rogene Houston, MD;  Location: [facility];  Service: Endoscopy;  Laterality: N/A;  200  . FRACTURE SURGERY    . KNEE ARTHROSCOPY Right   . LEFT HEART CATH AND CORONARY ANGIOGRAPHY N/A 02/12/2018   Procedure: LEFT HEART CATH AND CORONARY ANGIOGRAPHY;  Surgeon: Troy Sine, MD;  Location: [facility];  Service: Cardiovascular;  Laterality: N/A;  . LUMBAR [place]    . POLYPECTOMY  11/29/2017   Procedure: POLYPECTOMY;  Surgeon: Rogene Houston, MD;  Location: [facility];  Service: Endoscopy;;  transverse  . SHOULDER ARTHROSCOPY WITH ROTATOR CUFF REPAIR AND OPEN BICEPS TENODESIS Left 12/30/2018   Procedure: LEFT SHOULDER ARTHROSCOPY WITH ROTATOR CUFF REPAIR;  Surgeon: Netta Cedars, MD;  Location: [facility];  Service: Orthopedics;  Laterality: Left;  . TONSILLECTOMY  1958  . WRIST FRACTURE SURGERY Left 1994  Current Outpatient Medications  Medication Sig Dispense Refill  . acetaminophen (TYLENOL) 325 MG tablet Take 650 mg by mouth every 6 (six) hours as needed for moderate pain or headache.     Marland Kitchen aspirin EC 81 MG tablet Take 1 tablet (81 mg total) by mouth daily.    . carbidopa-levodopa (SINEMET CR) 50-200 MG tablet Take 1 tablet by mouth at bedtime.    . carbidopa-levodopa (SINEMET IR) 25-250 MG tablet Take 1 tablet by mouth 3 (three) times daily.     . carboxymethylcellulose (REFRESH PLUS) 0.5 % SOLN Place 1 drop into both eyes 3 (three) times daily as needed (for dry eyes).     . Cholecalciferol  (VITAMIN D3) 1000 units CAPS Take 1,000 Units by mouth daily.     Marland Kitchen docusate sodium (COLACE) 100 MG capsule Take 100 mg by mouth daily.    Marland Kitchen glucosamine-chondroitin 500-400 MG tablet Take 1 tablet by mouth 2 (two) times daily.     Marland Kitchen loratadine (CLARITIN) 10 MG tablet Take 10 mg by mouth daily.    . Melatonin 3 MG TABS Take 6 mg by mouth at bedtime.     . memantine (NAMENDA) 10 MG tablet Take 10 mg by mouth 2 (two) times daily.    . metoprolol tartrate (LOPRESSOR) 25 MG tablet Take 0.5 tablets (12.5 mg total) by mouth 2 (two) times daily. 30 tablet 0  . nitroGLYCERIN (NITROSTAT) 0.4 MG SL tablet Place 1 tablet (0.4 mg total) under the tongue every 5 (five) minutes as needed for chest pain. 75 tablet 3  . omeprazole (PRILOSEC) 20 MG capsule Take 20 mg by mouth 2 (two) times daily before a meal.     . polyethylene glycol powder (GLYCOLAX/MIRALAX) powder Take 8.5 g by mouth daily as needed. (Patient taking differently: Take 17 g by mouth daily as needed for mild constipation. ) 255 g 0  . rosuvastatin (CRESTOR) 5 MG tablet Take 2.5 mg by mouth daily.     . sildenafil (VIAGRA) 50 MG tablet Take 50 mg by mouth daily as needed for erectile dysfunction.    Marland Kitchen apixaban (ELIQUIS) 5 MG TABS tablet Take 1 tablet (5 mg total) by mouth 2 (two) times daily. 180 tablet 2   No current facility-administered medications for this visit.    Allergies:  Iodinated diagnostic agents and Sulfa drugs cross reactors   Social History: The patient  reports that he quit smoking about 16 years ago. His smoking use included cigarettes. He has a 40.00 pack-year smoking history. He has never used smokeless tobacco. He reports previous alcohol use. He reports that he does not use drugs.   ROS:  Please see the history of present illness. Otherwise, complete review of systems is positive for memory loss.  All other systems are reviewed and negative.   Physical Exam: VS:  BP 111/72   Pulse (!) 55   Ht 5\' 7"  (1.702 m)   Wt 230 lb  (104.3 kg)   SpO2 99%   BMI 36.02 kg/m , BMI Body mass index is 36.02 kg/m.  Wt Readings from Last 3 Encounters:  04/15/19 230 lb (104.3 kg)  12/30/18 233 lb 7.5 oz (105.9 kg)  10/01/18 236 lb (107 kg)    General: Obese male, appears comfortable at rest. HEENT: Conjunctiva and lids normal, wearing a mask. Neck: Supple, no elevated JVP or carotid bruits, no thyromegaly. Lungs: Clear to auscultation, nonlabored breathing at rest. Cardiac: Regular rate and rhythm, no S3 or significant systolic murmur. Abdomen:  Soft, nontender, bowel sounds present. Extremities: No pitting edema, distal pulses 2+. Skin: Warm and dry. Musculoskeletal: No kyphosis. Neuropsychiatric: Alert and oriented x3, affect grossly appropriate.  ECG:  An ECG dated 02/11/2018 was personally reviewed today and demonstrated:  Sinus rhythm with Q wave in lead III.  Recent Labwork: 04/09/2019: BUN 12; Creat 0.85; Hemoglobin 15.3; Platelets 178; Potassium 5.0; Sodium 142     Component Value Date/Time   CHOL 132 02/12/2018 0344   TRIG 84 02/12/2018 0344   HDL 50 02/12/2018 0344   CHOLHDL 2.6 02/12/2018 0344   VLDL 17 02/12/2018 0344   LDLCALC 65 02/12/2018 0344    Other Studies Reviewed Today:  Cardiac catheterization 02/12/2018:  Prox RCA to Mid RCA lesion is 20% stenosed.  Ost 2nd Mrg lesion is 50% stenosed.  Ost 1st Diag to 1st Diag lesion is 40% stenosed.  Prox LAD lesion is 20% stenosed.  Evidence for mild coronary calcification in all coronary arteries with 20% smooth LAD stenosis, 40% mid first diagonal stenosis; 50% proximal narrowing in the circumflex marginal branch, and large dominant RCA with smooth 20% mid stenosis.  LVEDP 19 mmHg.  Echocardiogram 02/09/2018: Study Conclusions  - Left ventricle: The cavity size was normal. Wall thickness was normal. Systolic function was vigorous. The estimated ejection fraction was in the range of 65% to 70%. Wall motion was normal; there were no  regional wall motion abnormalities.  Assessment and Plan:  1.  History of atrial fibrillation with CHADSVASC score of 3.  He does not describe any palpitations and is in sinus rhythm today.  We did discuss resuming Eliquis at 5 mg twice daily, stopping aspirin.  I reviewed his recent lab work and we will plan to obtain a CBC and BMET prior to his next visit in 3 months.  2.  Hyperlipidemia on Crestor.  Last LDL 65.  3.  Mild to moderate, nonobstructive CAD by cardiac catheterization in July 2019.  Will continue medical therapy in the absence of progressive angina symptoms.  Aspirin is being stopped with initiation of Eliquis.  Otherwise continue beta-blocker and statin.  Medication Adjustments/Labs and Tests Ordered: Current medicines are reviewed at length with the patient today.  Concerns regarding medicines are outlined above.   Tests Ordered: Orders Placed This Encounter  Procedures  . Basic metabolic panel  . CBC    Medication Changes: Meds ordered this encounter  Medications  . apixaban (ELIQUIS) 5 MG TABS tablet    Sig: Take 1 tablet (5 mg total) by mouth 2 (two) times daily.    Dispense:  180 tablet    Refill:  2    04/15/2019 Restart    Disposition:  Follow up 3 months in the [place] office.  Signed, Satira Sark, MD, [facility] 04/15/2019 4:53 PM    [facility] [place], [place], [place] [address] Phone: [phone]; Fax: [phone]

## 2019-04-16 NOTE — Addendum Note (Signed)
Addended by: Merlene Laughter on: 04/16/2019 08:20 AM   Modules accepted: Orders

## 2019-04-17 NOTE — Telephone Encounter (Signed)
Wife informed and verbalized understanding of plan. 

## 2019-04-24 DIAGNOSIS — M545 Low back pain: Secondary | ICD-10-CM | POA: Diagnosis not present

## 2019-04-24 DIAGNOSIS — M5416 Radiculopathy, lumbar region: Secondary | ICD-10-CM | POA: Diagnosis not present

## 2019-04-30 ENCOUNTER — Emergency Department (HOSPITAL_COMMUNITY)
Admission: EM | Admit: 2019-04-30 | Discharge: 2019-04-30 | Disposition: A | Discharge status: Home / Self Care | Payer: No Typology Code available for payment source | Attending: Emergency Medicine | Admitting: Emergency Medicine

## 2019-04-30 ENCOUNTER — Other Ambulatory Visit: Payer: Self-pay

## 2019-04-30 ENCOUNTER — Encounter (HOSPITAL_COMMUNITY): Payer: Self-pay | Admitting: *Deleted

## 2019-04-30 ENCOUNTER — Emergency Department (HOSPITAL_COMMUNITY): Payer: No Typology Code available for payment source

## 2019-04-30 DIAGNOSIS — I252 Old myocardial infarction: Secondary | ICD-10-CM | POA: Insufficient documentation

## 2019-04-30 DIAGNOSIS — R002 Palpitations: Secondary | ICD-10-CM | POA: Insufficient documentation

## 2019-04-30 DIAGNOSIS — N3000 Acute cystitis without hematuria: Secondary | ICD-10-CM | POA: Insufficient documentation

## 2019-04-30 DIAGNOSIS — R509 Fever, unspecified: Secondary | ICD-10-CM | POA: Diagnosis not present

## 2019-04-30 DIAGNOSIS — I2510 Atherosclerotic heart disease of native coronary artery without angina pectoris: Secondary | ICD-10-CM | POA: Insufficient documentation

## 2019-04-30 DIAGNOSIS — Z79899 Other long term (current) drug therapy: Secondary | ICD-10-CM | POA: Insufficient documentation

## 2019-04-30 DIAGNOSIS — Z87891 Personal history of nicotine dependence: Secondary | ICD-10-CM | POA: Diagnosis not present

## 2019-04-30 DIAGNOSIS — N410 Acute prostatitis: Secondary | ICD-10-CM | POA: Insufficient documentation

## 2019-04-30 DIAGNOSIS — I10 Essential (primary) hypertension: Secondary | ICD-10-CM | POA: Diagnosis not present

## 2019-04-30 DIAGNOSIS — Z7901 Long term (current) use of anticoagulants: Secondary | ICD-10-CM | POA: Insufficient documentation

## 2019-04-30 DIAGNOSIS — G20 Parkinson's disease: Secondary | ICD-10-CM | POA: Insufficient documentation

## 2019-04-30 LAB — COMPREHENSIVE METABOLIC PANEL WITH GFR
ALT: 8 U/L (ref 0–44)
AST: 14 U/L — ABNORMAL LOW (ref 15–41)
Albumin: 4 g/dL (ref 3.5–5.0)
Alkaline Phosphatase: 79 U/L (ref 38–126)
Anion gap: 9 (ref 5–15)
BUN: 16 mg/dL (ref 8–23)
CO2: 24 mmol/L (ref 22–32)
Calcium: 9.1 mg/dL (ref 8.9–10.3)
Chloride: 103 mmol/L (ref 98–111)
Creatinine, Ser: 0.95 mg/dL (ref 0.61–1.24)
GFR calc Af Amer: >60 mL/min
GFR calc non Af Amer: >60 mL/min
Glucose, Bld: 101 mg/dL — ABNORMAL HIGH (ref 70–99)
Potassium: 4.1 mmol/L (ref 3.5–5.1)
Sodium: 136 mmol/L (ref 135–145)
Total Bilirubin: 1.1 mg/dL (ref 0.3–1.2)
Total Protein: 7.2 g/dL (ref 6.5–8.1)

## 2019-04-30 LAB — CBC WITH DIFFERENTIAL/PLATELET
Abs Immature Granulocytes: 0.07 10*3/uL (ref 0.00–0.07)
Basophils Absolute: 0 10*3/uL (ref 0.0–0.1)
Basophils Relative: 0 %
Eosinophils Absolute: 0 10*3/uL (ref 0.0–0.5)
Eosinophils Relative: 0 %
HCT: 44.1 % (ref 39.0–52.0)
Hemoglobin: 14.5 g/dL (ref 13.0–17.0)
Immature Granulocytes: 1 %
Lymphocytes Relative: 8 %
Lymphs Abs: 1.1 10*3/uL (ref 0.7–4.0)
MCH: 32.7 pg (ref 26.0–34.0)
MCHC: 32.9 g/dL (ref 30.0–36.0)
MCV: 99.5 fL (ref 80.0–100.0)
Monocytes Absolute: 1.2 10*3/uL — ABNORMAL HIGH (ref 0.1–1.0)
Monocytes Relative: 9 %
Neutro Abs: 11.6 10*3/uL — ABNORMAL HIGH (ref 1.7–7.7)
Neutrophils Relative %: 82 %
Platelets: 153 10*3/uL (ref 150–400)
RBC: 4.43 MIL/uL (ref 4.22–5.81)
RDW: 12.2 % (ref 11.5–15.5)
WBC: 14 10*3/uL — ABNORMAL HIGH (ref 4.0–10.5)
nRBC: 0 % (ref 0.0–0.2)

## 2019-04-30 LAB — URINALYSIS, ROUTINE W REFLEX MICROSCOPIC
Bilirubin Urine: NEGATIVE
Glucose, UA: NEGATIVE mg/dL
Ketones, ur: 5 mg/dL — AB
Nitrite: POSITIVE — AB
Protein, ur: NEGATIVE mg/dL
Specific Gravity, Urine: 1.023 (ref 1.005–1.030)
WBC, UA: >50 WBC/hpf — ABNORMAL HIGH (ref 0–5)
pH: 7 (ref 5.0–8.0)

## 2019-04-30 MED ORDER — LEVOFLOXACIN 500 MG PO TABS: 500.0000 mg | ORAL_TABLET | Freq: Every day | ORAL | 0 refills | Status: DC

## 2019-04-30 MED ORDER — SODIUM CHLORIDE 0.9 % IV SOLN
1.0000 g | Freq: Once | INTRAVENOUS | Status: AC
  Administered 2019-04-30: 1 g via INTRAVENOUS
  Filled 2019-04-30: qty 10

## 2019-04-30 NOTE — Discharge Instructions (Addendum)
Urinalysis suggestive of urinary tract infection.  Possibly could be a prostate infection.  So we will treat you with levofloxacin for 14 days to make sure the infection gets cleared up.  Make an appointment to follow-up with your doctor to have your urine rechecked in about 7 to 10 days.  Return for any new or worse symptoms.  Would expect improvement over the next 2 days.  Urine sent for culture.  Also given a gram of Rocephin here prior to discharge.

## 2019-04-30 NOTE — ED Provider Notes (Signed)
[facility] EMERGENCY DEPARTMENT Provider Note   CSN: [id number] Arrival date & time: 04/30/19  2008     History   Chief Complaint Chief Complaint  Patient presents with  . Palpitations    HPI David Combs is a 73 y.o. male.     Patient came in with a little bit of a myriad of complaints.  Apparently had a fever at home 200.4.  Did not have Tylenol temp upon arrival here 99.2.  Also felt as if he had palpitations.  Seem to have some chills.  Started feeling bad at around 4 PM this afternoon.  Patient's had a history of urinary tract infections also has had a history of prostatitis.  No nausea or vomiting.  Past medical history significant for history of atrial fibrillation Parkinson's disease coronary artery disease.  Patient also had urinary frequency and had difficulty getting to the toilet before he would pee.  That is been going on for 2 days.  Patient is on Eliquis.     Past Medical History:  Diagnosis Date  . Agent orange exposure   . Anxiety   . Arthritis   . Chronic lower back pain   . Coronary atherosclerosis of native coronary artery    70-80% small diagonal - medically managed, LVEF 55%  . Depression   . Dermatophytosis of nail   . ED (erectile dysfunction)   . Essential hypertension   . GERD (gastroesophageal reflux disease)   . History of colonic polyps 10/08/2017  . History of pneumonia   . Lumbago   . Mixed hyperlipidemia   . Non-ST elevation myocardial infarction (NSTEMI) ([place]) 05/2010  . Obesity   . Parkinson disease ([place])   . Paroxysmal atrial fibrillation (HCC)   . Prostatitis   . Rotator cuff tear, left   . Sleep apnea    does not use CPAP    Patient Active Problem List   Diagnosis Date Noted  . Unstable angina ([place]) 02/11/2018  . Parkinson's disease ([place]) 02/11/2018  . Atrial flutter with rapid ventricular response ([place]) 02/08/2018  . History of colonic polyps 10/08/2017  . Precordial chest pain 05/02/2017  . Coronary atherosclerosis  of native coronary artery   . Essential hypertension   . Hyperlipemia   . Obesity     Past Surgical History:  Procedure Laterality Date  . ANTERIOR CERVICAL DECOMP/DISCECTOMY FUSION    . APPENDECTOMY    . BACK SURGERY    . CARDIAC CATHETERIZATION  X 3  . CATARACT EXTRACTION W/ INTRAOCULAR LENS  IMPLANT, BILATERAL Bilateral   . CHOLECYSTECTOMY OPEN  05/2014   rt upper quadrant pain   . COLONOSCOPY N/A 11/29/2017   Procedure: COLONOSCOPY;  Surgeon: Rogene Houston, MD;  [facility];  Service: Endoscopy;  Laterality: N/A;  8:30  . ESOPHAGOGASTRODUODENOSCOPY N/A 12/02/2014   Procedure: ESOPHAGOGASTRODUODENOSCOPY (EGD);  Surgeon: Rogene Houston, MD;  [facility];  Service: Endoscopy;  Laterality: N/A;  200  . FRACTURE SURGERY    . KNEE ARTHROSCOPY Right   . LEFT HEART CATH AND CORONARY ANGIOGRAPHY N/A 02/12/2018   Procedure: LEFT HEART CATH AND CORONARY ANGIOGRAPHY;  Surgeon: Troy Sine, MD;  Location: [place] [facility] CV LAB;  Service: Cardiovascular;  Laterality: N/A;  . LUMBAR [place]    . POLYPECTOMY  11/29/2017   Procedure: POLYPECTOMY;  Surgeon: Rogene Houston, MD;  [facility];  Service: Endoscopy;;  transverse  . SHOULDER ARTHROSCOPY WITH ROTATOR CUFF REPAIR AND OPEN BICEPS  TENODESIS Left 12/30/2018   Procedure: LEFT SHOULDER ARTHROSCOPY WITH ROTATOR CUFF REPAIR;  Surgeon: Netta Cedars, MD;  Location: [facility];  Service: Orthopedics;  Laterality: Left;  . TONSILLECTOMY  1958  . WRIST FRACTURE SURGERY Left 1994        Home Medications    Prior to Admission medications   Medication Sig Start Date End Date Taking? Authorizing Provider  acetaminophen (TYLENOL) 325 MG tablet Take 650 mg by mouth every 6 (six) hours as needed for moderate pain or headache.     [provider]  apixaban (ELIQUIS) 5 MG TABS tablet Take 1 tablet (5 mg total) by mouth 2 (two) times daily. 04/15/19   Satira Sark, MD   carbidopa-levodopa (SINEMET CR) 50-200 MG tablet Take 1 tablet by mouth at bedtime.    [provider]  carbidopa-levodopa (SINEMET IR) 25-250 MG tablet Take 1 tablet by mouth 3 (three) times daily.     [provider]  carboxymethylcellulose (REFRESH PLUS) 0.5 % SOLN Place 1 drop into both eyes 3 (three) times daily as needed (for dry eyes).     [provider]  Cholecalciferol (VITAMIN D3) 1000 units CAPS Take 1,000 Units by mouth daily.     [provider]  docusate sodium (COLACE) 100 MG capsule Take 100 mg by mouth daily.    [provider]  glucosamine-chondroitin 500-400 MG tablet Take 1 tablet by mouth 2 (two) times daily.     [provider]  levofloxacin (LEVAQUIN) 500 MG tablet Take 1 tablet (500 mg total) by mouth daily. 04/30/19   Fredia Sorrow, MD  loratadine (CLARITIN) 10 MG tablet Take 10 mg by mouth daily.    [provider]  Melatonin 3 MG TABS Take 6 mg by mouth at bedtime.     [provider]  memantine (NAMENDA) 10 MG tablet Take 10 mg by mouth 2 (two) times daily.    [provider]  metoprolol tartrate (LOPRESSOR) 25 MG tablet Take 0.5 tablets (12.5 mg total) by mouth 2 (two) times daily. 03/21/18   Herminio Commons, MD  nitroGLYCERIN (NITROSTAT) 0.4 MG SL tablet Place 1 tablet (0.4 mg total) under the tongue every 5 (five) minutes as needed for chest pain. 08/06/15   Satira Sark, MD  omeprazole (PRILOSEC) 20 MG capsule Take 20 mg by mouth 2 (two) times daily before a meal.     [provider]  polyethylene glycol powder (GLYCOLAX/MIRALAX) powder Take 8.5 g by mouth daily as needed. Patient taking differently: Take 17 g by mouth daily as needed for mild constipation.  11/29/17   Rehman, Mechele Dawley, MD  rosuvastatin (CRESTOR) 5 MG tablet Take 2.5 mg by mouth daily.     [provider]  sildenafil (VIAGRA) 50 MG tablet Take 50 mg by mouth daily as needed for erectile  dysfunction.    [provider]    Family History Family History  Problem Relation Age of Onset  . Lymphoma Sister     Social History Social History   Tobacco Use  . Smoking status: Former Smoker    Packs/day: 1.00    Years: 40.00    Pack years: 40.00    Types: Cigarettes    Quit date: 05/31/2002    Years since quitting: 16.9  . Smokeless tobacco: Never Used  Substance Use Topics  . Alcohol use: Not Currently    Alcohol/week: 0.0 standard drinks  . Drug use: Never     Allergies  Iodinated diagnostic agents and Sulfa drugs cross reactors   Review of Systems Review of Systems  Constitutional: Positive for chills and fatigue.  HENT: Negative for congestion.   Cardiovascular: Positive for palpitations.  Genitourinary: Positive for frequency. Negative for dysuria.     Physical Exam Updated Vital Signs BP (!) 170/89 (BP Location: Right Arm)   Pulse 69   Temp 99.2 F (37.3 C) (Oral)   Resp 18   Ht 1.702 m (_0 )   Wt 104.3 kg   SpO2 99%   BMI 36.02 kg/m   Physical Exam Vitals signs and nursing note reviewed.  Constitutional:      General: He is not in acute distress.    Appearance: Normal appearance. He is well-developed.  HENT:     Head: Normocephalic and atraumatic.  Eyes:     Extraocular Movements: Extraocular movements intact.     Conjunctiva/sclera: Conjunctivae normal.     Pupils: Pupils are equal, round, and reactive to light.  Neck:     Musculoskeletal: Normal range of motion and neck supple.  Cardiovascular:     Rate and Rhythm: Normal rate and regular rhythm.     Heart sounds: No murmur.  Pulmonary:     Effort: Pulmonary effort is normal. No respiratory distress.     Breath sounds: Normal breath sounds.  Abdominal:     Palpations: Abdomen is soft.     Tenderness: There is no abdominal tenderness.  Musculoskeletal: Normal range of motion.  Skin:    General: Skin is warm and dry.  Neurological:     General: No focal deficit  present.     Mental Status: He is alert. Mental status is at baseline.      ED Treatments / Results  Labs (all labs ordered are listed, but only abnormal results are displayed) Labs Reviewed  URINALYSIS, ROUTINE W REFLEX MICROSCOPIC - Abnormal; Notable for the following components:      Result Value   APPearance HAZY (*)    Hgb urine dipstick SMALL (*)    Ketones, ur 5 (*)    Nitrite POSITIVE (*)    Leukocytes,Ua MODERATE (*)    WBC, UA >50 (*)    Bacteria, UA RARE (*)    All other components within normal limits  CBC WITH DIFFERENTIAL/PLATELET - Abnormal; Notable for the following components:   WBC 14.0 (*)    Neutro Abs 11.6 (*)    Monocytes Absolute 1.2 (*)    All other components within normal limits  COMPREHENSIVE METABOLIC PANEL - Abnormal; Notable for the following components:   Glucose, Bld 101 (*)    AST 14 (*)    All other components within normal limits  URINE CULTURE    EKG EKG Interpretation  Date/Time:  Wednesday April 30 2019 20:33:51 EDT Ventricular Rate:  70 PR Interval:    QRS Duration: 105 QT Interval:  403 QTC Calculation: 435 R Axis:   42 Text Interpretation:  Sinus rhythm Confirmed by Fredia Sorrow [phone]) on 04/30/2019 8:37:54 PM   Radiology Dg Chest Port 1 View  Result Date: 04/30/2019 CLINICAL DATA:  Fever.  Tachycardia.  Previous myocardial infarct. EXAM: PORTABLE CHEST 1 VIEW COMPARISON:  02/08/2018 FINDINGS: The heart size and mediastinal contours are within normal limits. Both lungs are clear. The visualized skeletal structures are unremarkable. IMPRESSION: No active disease. Electronically Signed   By: Marlaine Hind M.D.   On: 04/30/2019 21:47    Procedures Procedures (including critical care time)  Medications Ordered in ED  Medications  cefTRIAXone (ROCEPHIN) 1 g in sodium chloride 0.9 % 100 mL IVPB (has no administration in time range)     Initial Impression / Assessment and Plan / ED Course  I have reviewed the triage  vital signs and the nursing notes.  Pertinent labs & imaging results that were available during my care of the patient were reviewed by me and considered in my medical decision making (see chart for details).        Work-up significant for leukocytosis urinalysis with positive nitrite.  Chest x-ray without evidence of pneumonia.  EKG was sinus rhythm with a heart rate of 70.  Electrolytes without significant abnormalities.  Initially decided to treat patient with IV Rocephin here for UTI.  But with the past history of prostatitis will continue that treatment with IV Rocephin but will switch him over at home to Levaquin 500 mg daily for 14 days since he has had prostate infections in the past.  Have him follow-up with his primary care doctor in the [place] area in 7 to 10 days for recheck we will have him brought back if he is not improving over the next couple days or for anything new or worse.  Patient nontoxic no acute distress.  Final Clinical Impressions(s) / ED Diagnoses   Final diagnoses:  Acute cystitis without hematuria  Prostatitis, acute    ED Discharge Orders         Ordered    levofloxacin (LEVAQUIN) 500 MG tablet  Daily     04/30/19 2235           Fredia Sorrow, MD 04/30/19 2241

## 2019-04-30 NOTE — ED Triage Notes (Signed)
Pt c/o feeling like his heart has been racing today; pt also states he has had incontinence of bladder x 2 days

## 2019-05-03 LAB — URINE CULTURE: Culture: >=100000 — AB

## 2019-05-04 ENCOUNTER — Telehealth: Payer: Self-pay | Admitting: Emergency Medicine

## 2019-05-04 NOTE — Telephone Encounter (Signed)
Post ED Visit - Positive Culture Follow-up  Culture report reviewed by antimicrobial stewardship pharmacist: [place] Team []  Elenor Quinones, Pharm.D. []  Heide Guile, Pharm.D., BCPS AQ-ID []  Parks Neptune, Pharm.D., BCPS []  Alycia Rossetti, Pharm.D., BCPS []  [place], Pharm.D., BCPS, AAHIVP []  Legrand Como, Pharm.D., BCPS, AAHIVP []  Salome Arnt, PharmD, BCPS []  Johnnette Gourd, PharmD, BCPS [x]  Hughes Better, PharmD, BCPS []  Leeroy Cha, PharmD []  Laqueta Linden, PharmD, BCPS []  Albertina Parr, PharmD  [place] Team []  Leodis Sias, PharmD []  Lindell Spar, PharmD []  Royetta Asal, PharmD []  Graylin Shiver, Rph []  Rema Fendt) Glennon Mac, PharmD []  Arlyn Dunning, PharmD []  Netta Cedars, PharmD []  Dia Sitter, PharmD []  Leone Haven, PharmD []  Gretta Arab, PharmD []  Theodis Shove, PharmD []  Peggyann Juba, PharmD []  Reuel Boom, PharmD   Positive urine culture Treated with Levofloxacin, organism sensitive to the same and no further patient follow-up is required at this time.  Sandi Raveling Bruce Churilla 05/04/2019, 11:19 AM

## 2019-05-14 DIAGNOSIS — M5136 Other intervertebral disc degeneration, lumbar region: Secondary | ICD-10-CM | POA: Diagnosis not present

## 2019-05-14 DIAGNOSIS — Z6835 Body mass index (BMI) 35.0-35.9, adult: Secondary | ICD-10-CM | POA: Diagnosis not present

## 2019-05-14 DIAGNOSIS — M545 Low back pain: Secondary | ICD-10-CM | POA: Diagnosis not present

## 2019-05-14 DIAGNOSIS — M5126 Other intervertebral disc displacement, lumbar region: Secondary | ICD-10-CM | POA: Diagnosis not present

## 2019-05-14 DIAGNOSIS — M5441 Lumbago with sciatica, right side: Secondary | ICD-10-CM | POA: Diagnosis not present

## 2019-05-14 DIAGNOSIS — G20 Parkinson's disease: Secondary | ICD-10-CM | POA: Diagnosis not present

## 2019-05-14 DIAGNOSIS — M5416 Radiculopathy, lumbar region: Secondary | ICD-10-CM | POA: Diagnosis not present

## 2019-07-15 ENCOUNTER — Encounter: Payer: Self-pay | Admitting: *Deleted

## 2019-07-31 ENCOUNTER — Ambulatory Visit: Payer: MEDICARE | Admitting: Cardiology

## 2019-08-19 ENCOUNTER — Telehealth: Payer: Self-pay | Admitting: Cardiology

## 2019-08-19 NOTE — Telephone Encounter (Signed)
Patient's wife called stating they have been waiting to hear if we will be able to use patient's lab work from the [place].  If not, they will need the orders so that he can get lab work drawn before his appointment on  Sep 02, 2019

## 2019-08-19 NOTE — Telephone Encounter (Signed)
Wife advised that per Dr. Domenic Polite, lab work to check bmet & cbc is still needed before 09/02/19 visit. Lab orders faxed to [facility] per wife request.

## 2019-08-25 DIAGNOSIS — Z8679 Personal history of other diseases of the circulatory system: Secondary | ICD-10-CM | POA: Diagnosis not present

## 2019-08-25 DIAGNOSIS — Z79899 Other long term (current) drug therapy: Secondary | ICD-10-CM | POA: Diagnosis not present

## 2019-08-25 DIAGNOSIS — I25119 Atherosclerotic heart disease of native coronary artery with unspecified angina pectoris: Secondary | ICD-10-CM | POA: Diagnosis not present

## 2019-08-25 LAB — CBC
HCT: 44.6 % (ref 38.5–50.0)
Hemoglobin: 15.4 g/dL (ref 13.2–17.1)
MCH: 34.1 pg — ABNORMAL HIGH (ref 27.0–33.0)
MCHC: 34.5 g/dL (ref 32.0–36.0)
MCV: 98.7 fL (ref 80.0–100.0)
MPV: 11.7 fL (ref 7.5–12.5)
Platelets: 185 10*3/uL (ref 140–400)
RBC: 4.52 10*6/uL (ref 4.20–5.80)
RDW: 11.9 % (ref 11.0–15.0)
WBC: 6.7 10*3/uL (ref 3.8–10.8)

## 2019-08-25 LAB — BASIC METABOLIC PANEL
BUN: 15 mg/dL (ref 7–25)
CO2: 30 mmol/L (ref 20–32)
Calcium: 9.7 mg/dL (ref 8.6–10.3)
Chloride: 105 mmol/L (ref 98–110)
Creat: 0.86 mg/dL (ref 0.70–1.18)
Glucose, Bld: 87 mg/dL (ref 65–139)
Potassium: 4.9 mmol/L (ref 3.5–5.3)
Sodium: 140 mmol/L (ref 135–146)

## 2019-08-26 ENCOUNTER — Telehealth: Payer: Self-pay | Admitting: *Deleted

## 2019-08-26 NOTE — Telephone Encounter (Signed)
Patient informed. Copy sent to PCP °

## 2019-08-26 NOTE — Telephone Encounter (Signed)
-----   Message from Satira Sark, MD sent at 08/26/2019  8:28 AM EST ----- Results reviewed.  Renal function and potassium remain normal.

## 2019-08-26 NOTE — Telephone Encounter (Signed)
-----   Message from Satira Sark, MD sent at 08/25/2019  2:26 PM EST ----- Results reviewed.  Hemoglobin and platelet count are normal.

## 2019-08-28 DIAGNOSIS — Z23 Encounter for immunization: Secondary | ICD-10-CM | POA: Diagnosis not present

## 2019-09-02 ENCOUNTER — Other Ambulatory Visit: Payer: Self-pay

## 2019-09-02 ENCOUNTER — Encounter: Payer: Self-pay | Admitting: Cardiology

## 2019-09-02 ENCOUNTER — Ambulatory Visit (INDEPENDENT_AMBULATORY_CARE_PROVIDER_SITE_OTHER): Payer: MEDICARE | Admitting: Cardiology

## 2019-09-02 ENCOUNTER — Encounter (INDEPENDENT_AMBULATORY_CARE_PROVIDER_SITE_OTHER): Payer: Self-pay

## 2019-09-02 VITALS — BP 118/78 | HR 52 | Ht 67.0 in | Wt 244.0 lb

## 2019-09-02 DIAGNOSIS — Z8679 Personal history of other diseases of the circulatory system: Secondary | ICD-10-CM | POA: Diagnosis not present

## 2019-09-02 DIAGNOSIS — E782 Mixed hyperlipidemia: Secondary | ICD-10-CM

## 2019-09-02 DIAGNOSIS — I25119 Atherosclerotic heart disease of native coronary artery with unspecified angina pectoris: Secondary | ICD-10-CM

## 2019-09-02 NOTE — Patient Instructions (Addendum)
Medication Instructions:    Your physician recommends that you continue on your current medications as directed. Please refer to the Current Medication list given to you today.  Labwork:  Your physician recommends that you return for non-fasting lab work in: 6 months just before your next visit to check your BMET & CBC. We will notify you when this is due.  Testing/Procedures:  NONE  Follow-Up:  Your physician recommends that you schedule a follow-up appointment in: 6 months (office). You will receive a reminder letter in the mail in about 4 months reminding you to call and schedule your appointment. If you don't receive this letter, please contact our office.  Any Other Special Instructions Will Be Listed Below (If Applicable).  If you need a refill on your cardiac medications before your next appointment, please call your pharmacy.

## 2019-09-02 NOTE — Progress Notes (Signed)
Cardiology Office Note  Date: 09/02/2019   ID: David Combs, David Combs [birth year], MRN [id number]  PCP:  David Chroman, MD  Cardiologist:  David Lesches, MD Electrophysiologist:  None   Chief Complaint  Patient presents with  . Cardiac follow-up    History of Present Illness: David Combs is a 74 y.o. male last seen in September 2020. He is here today with his wife for a follow-up visit. He does not describe any recurring chest pain or progressive palpitations on current therapy.  Recent lab work is reviewed below. Hemoglobin and renal function are normal. He does not report any bleeding problems.  I went over his current medications. Cardiac regimen includes Eliquis, Lopressor, Crestor, and as needed nitroglycerin.  Past Medical History:  Diagnosis Date  . Agent orange exposure   . Anxiety   . Arthritis   . Chronic lower back pain   . Coronary atherosclerosis of native coronary artery    70-80% small diagonal - medically managed, LVEF 55%  . Depression   . Dermatophytosis of nail   . ED (erectile dysfunction)   . Essential hypertension   . GERD (gastroesophageal reflux disease)   . History of colonic polyps 10/08/2017  . History of pneumonia   . Lumbago   . Mixed hyperlipidemia   . Non-ST elevation myocardial infarction (NSTEMI) ([place]) 05/2010  . Obesity   . Parkinson disease ([place])   . Paroxysmal atrial fibrillation (HCC)   . Prostatitis   . Rotator cuff tear, left   . Sleep apnea    does not use CPAP    Past Surgical History:  Procedure Laterality Date  . ANTERIOR CERVICAL DECOMP/DISCECTOMY FUSION    . APPENDECTOMY    . BACK SURGERY    . CARDIAC CATHETERIZATION  X 3  . CATARACT EXTRACTION W/ INTRAOCULAR LENS  IMPLANT, BILATERAL Bilateral   . CHOLECYSTECTOMY OPEN  05/2014   rt upper quadrant pain   . COLONOSCOPY N/A 11/29/2017   Procedure: COLONOSCOPY;  Surgeon: David Houston, MD;  [facility];  Service: Endoscopy;  Laterality: N/A;  8:30   . ESOPHAGOGASTRODUODENOSCOPY N/A 12/02/2014   Procedure: ESOPHAGOGASTRODUODENOSCOPY (EGD);  Surgeon: David Houston, MD;  [facility];  Service: Endoscopy;  Laterality: N/A;  200  . FRACTURE SURGERY    . KNEE ARTHROSCOPY Right   . LEFT HEART CATH AND CORONARY ANGIOGRAPHY N/A 02/12/2018   Procedure: LEFT HEART CATH AND CORONARY ANGIOGRAPHY;  Surgeon: David Sine, MD;  Location: [facility];  Service: Cardiovascular;  Laterality: N/A;  . LUMBAR [place]    . POLYPECTOMY  11/29/2017   Procedure: POLYPECTOMY;  Surgeon: David Houston, MD;  [facility];  Service: Endoscopy;;  transverse  . SHOULDER ARTHROSCOPY WITH ROTATOR CUFF REPAIR AND OPEN BICEPS TENODESIS Left 12/30/2018   Procedure: LEFT SHOULDER ARTHROSCOPY WITH ROTATOR CUFF REPAIR;  Surgeon: David Cedars, MD;  Location: [facility];  Service: Orthopedics;  Laterality: Left;  . TONSILLECTOMY  1958  . WRIST FRACTURE SURGERY Left 1994    Current Outpatient Medications  Medication Sig Dispense Refill  . acetaminophen (TYLENOL) 325 MG tablet Take 650 mg by mouth every 6 (six) hours as needed for moderate pain or headache.     Marland Kitchen apixaban (ELIQUIS) 5 MG TABS tablet Take 1 tablet (5 mg total) by mouth 2 (two) times daily. 180 tablet 2  . carbidopa-levodopa (SINEMET CR) 50-200 MG tablet Take 1 tablet by mouth at  bedtime.    . carbidopa-levodopa (SINEMET IR) 25-250 MG tablet Take 1 tablet by mouth 3 (three) times daily.     . carboxymethylcellulose (REFRESH PLUS) 0.5 % SOLN Place 1 drop into both eyes 3 (three) times daily as needed (for dry eyes).     . Cholecalciferol (VITAMIN D3) 1000 units CAPS Take 1,000 Units by mouth daily.     Marland Kitchen docusate sodium (COLACE) 100 MG capsule Take 100 mg by mouth daily.    Marland Kitchen glucosamine-chondroitin 500-400 MG tablet Take 1 tablet by mouth 2 (two) times daily.     . Melatonin 3 MG TABS Take 6 mg by mouth at bedtime.     . metoprolol tartrate (LOPRESSOR) 25 MG  tablet Take 0.5 tablets (12.5 mg total) by mouth 2 (two) times daily. 30 tablet 0  . nitroGLYCERIN (NITROSTAT) 0.4 MG SL tablet Place 1 tablet (0.4 mg total) under the tongue every 5 (five) minutes as needed for chest pain. 75 tablet 3  . omeprazole (PRILOSEC) 20 MG capsule Take 20 mg by mouth 2 (two) times daily before a meal.     . polyethylene glycol powder (GLYCOLAX/MIRALAX) powder Take 8.5 g by mouth daily as needed. (Patient taking differently: Take 17 g by mouth daily as needed for mild constipation. ) 255 g 0  . rosuvastatin (CRESTOR) 5 MG tablet Take 2.5 mg by mouth daily.     . sildenafil (VIAGRA) 50 MG tablet Take 50 mg by mouth daily as needed for erectile dysfunction.     No current facility-administered medications for this visit.   Allergies:  Iodinated diagnostic agents and Sulfa drugs cross reactors   Social History: The patient  reports that he quit smoking about 17 years ago. His smoking use included cigarettes. He has a 40.00 pack-year smoking history. He has never used smokeless tobacco. He reports previous alcohol use. He reports that he does not use drugs.   ROS:  Please see the history of present illness. Otherwise, complete review of systems is positive for memory loss.  All other systems are reviewed and negative.   Physical Exam: VS:  BP 118/78   Pulse (!) 52   Ht 5\' 7"  (1.702 m)   Wt 244 lb (110.7 kg)   SpO2 98%   BMI 38.22 kg/m , BMI Body mass index is 38.22 kg/m.  Wt Readings from Last 3 Encounters:  09/02/19 244 lb (110.7 kg)  04/30/19 230 lb (104.3 kg)  04/15/19 230 lb (104.3 kg)    General: Patient appears comfortable at rest. HEENT: Conjunctiva and lids normal, wearing a mask. Neck: Supple, no elevated JVP or carotid bruits, no thyromegaly. Lungs: Clear to auscultation, nonlabored breathing at rest. Cardiac: Regular rate and rhythm, no S3 or significant systolic murmur. Abdomen: Soft, nontender, bowel sounds present. Extremities: No pitting edema,  distal pulses 2+. Skin: Warm and dry. Musculoskeletal: No kyphosis. Neuropsychiatric: Alert and oriented x3, affect grossly appropriate.  ECG:  An ECG dated 04/30/2019 was personally reviewed today and demonstrated:  Sinus rhythm.  Recent Labwork: 04/30/2019: ALT 8; AST 14 08/25/2019: BUN 15; Creat 0.86; Hemoglobin 15.4; Platelets 185; Potassium 4.9; Sodium 140     Component Value Date/Time   CHOL 132 02/12/2018 0344   TRIG 84 02/12/2018 0344   HDL 50 02/12/2018 0344   CHOLHDL 2.6 02/12/2018 0344   VLDL 17 02/12/2018 0344   LDLCALC 65 02/12/2018 0344    Other Studies Reviewed Today:  Cardiac catheterization 02/12/2018:  Prox RCA to Mid RCA lesion is  20% stenosed.  Ost 2nd Mrg lesion is 50% stenosed.  Ost 1st Diag to 1st Diag lesion is 40% stenosed.  Prox LAD lesion is 20% stenosed.  Evidence for mild coronary calcification in all coronary arteries with 20% smooth LAD stenosis, 40% mid first diagonal stenosis; 50% proximal narrowing in the circumflex marginal branch, and large dominant RCA with smooth 20% mid stenosis.  LVEDP 19 mmHg.  Echocardiogram 02/09/2018: Study Conclusions  - Left ventricle: The cavity size was normal. Wall thickness was normal. Systolic function was vigorous. The estimated ejection fraction was in the range of 65% to 70%. Wall motion was normal; there were no regional wall motion abnormalities.  Assessment and Plan:  1. History of atrial fibrillation, CHA2DS2-VASc score is 3. He is symptomatically stable with regular heart rate today. Plan to continue Eliquis for stroke prophylaxis, recent lab work reviewed. No change in beta-blocker dosing. Follow-up CBC and BMET for next visit.  2. Mixed hyperlipidemia, he continues on Crestor.  3. 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 He continues on beta-blocker and statin, not on aspirin in the setting of Eliquis use. No obvious angina  symptoms.  Medication Adjustments/Labs and Tests Ordered: Current medicines are reviewed at length with the patient today.  Concerns regarding medicines are outlined above.   Tests Ordered: No orders of the defined types were placed in this encounter.   Medication Changes: No orders of the defined types were placed in this encounter.   Disposition:  Follow up 6 months in the [place] office.  Signed, Satira Sark, MD, [facility] 09/02/2019 4:08 PM    [facility] [place], [place], [place] [address] Phone: [phone]; Fax: [phone]

## 2019-12-02 ENCOUNTER — Encounter: Payer: Self-pay | Admitting: Cardiology

## 2019-12-02 DIAGNOSIS — Z1331 Encounter for screening for depression: Secondary | ICD-10-CM | POA: Diagnosis not present

## 2019-12-02 DIAGNOSIS — R5383 Other fatigue: Secondary | ICD-10-CM | POA: Diagnosis not present

## 2019-12-02 DIAGNOSIS — I10 Essential (primary) hypertension: Secondary | ICD-10-CM | POA: Diagnosis not present

## 2019-12-02 DIAGNOSIS — N400 Enlarged prostate without lower urinary tract symptoms: Secondary | ICD-10-CM | POA: Diagnosis not present

## 2019-12-02 DIAGNOSIS — Z79899 Other long term (current) drug therapy: Secondary | ICD-10-CM | POA: Diagnosis not present

## 2019-12-02 DIAGNOSIS — E7800 Pure hypercholesterolemia, unspecified: Secondary | ICD-10-CM | POA: Diagnosis not present

## 2019-12-02 DIAGNOSIS — Z125 Encounter for screening for malignant neoplasm of prostate: Secondary | ICD-10-CM | POA: Diagnosis not present

## 2019-12-02 DIAGNOSIS — Z1339 Encounter for screening examination for other mental health and behavioral disorders: Secondary | ICD-10-CM | POA: Diagnosis not present

## 2019-12-02 DIAGNOSIS — Z1211 Encounter for screening for malignant neoplasm of colon: Secondary | ICD-10-CM | POA: Diagnosis not present

## 2019-12-02 DIAGNOSIS — Z6835 Body mass index (BMI) 35.0-35.9, adult: Secondary | ICD-10-CM | POA: Diagnosis not present

## 2019-12-02 DIAGNOSIS — Z299 Encounter for prophylactic measures, unspecified: Secondary | ICD-10-CM | POA: Diagnosis not present

## 2019-12-02 DIAGNOSIS — Z0000 Encounter for general adult medical examination without abnormal findings: Secondary | ICD-10-CM | POA: Diagnosis not present

## 2019-12-02 DIAGNOSIS — Z7189 Other specified counseling: Secondary | ICD-10-CM | POA: Diagnosis not present

## 2020-01-28 ENCOUNTER — Encounter: Payer: Self-pay | Admitting: Urology

## 2020-01-28 ENCOUNTER — Other Ambulatory Visit: Payer: Self-pay

## 2020-01-28 ENCOUNTER — Ambulatory Visit (INDEPENDENT_AMBULATORY_CARE_PROVIDER_SITE_OTHER): Payer: MEDICARE | Admitting: Urology

## 2020-01-28 VITALS — BP 137/83 | HR 66 | Temp 97.9°F | Ht 68.0 in | Wt 220.0 lb

## 2020-01-28 DIAGNOSIS — N138 Other obstructive and reflux uropathy: Secondary | ICD-10-CM | POA: Diagnosis not present

## 2020-01-28 DIAGNOSIS — R32 Unspecified urinary incontinence: Secondary | ICD-10-CM | POA: Insufficient documentation

## 2020-01-28 DIAGNOSIS — N401 Enlarged prostate with lower urinary tract symptoms: Secondary | ICD-10-CM

## 2020-01-28 DIAGNOSIS — R3912 Poor urinary stream: Secondary | ICD-10-CM

## 2020-01-28 DIAGNOSIS — I25119 Atherosclerotic heart disease of native coronary artery with unspecified angina pectoris: Secondary | ICD-10-CM

## 2020-01-28 LAB — POCT URINALYSIS DIPSTICK
Blood, UA: NEGATIVE
Glucose, UA: NEGATIVE
Leukocytes, UA: NEGATIVE
Nitrite, UA: NEGATIVE
Protein, UA: NEGATIVE
Spec Grav, UA: >=1.03 — AB (ref 1.010–1.025)
Urobilinogen, UA: NEGATIVE E.U./dL — AB
pH, UA: 5 (ref 5.0–8.0)

## 2020-01-28 LAB — BLADDER SCAN AMB NON-IMAGING: Scan Result: 68.2

## 2020-01-28 MED ORDER — ALFUZOSIN HCL ER 10 MG PO TB24: 10.0000 mg | ORAL_TABLET | Freq: Every day | ORAL | 11 refills | Status: DC

## 2020-01-28 NOTE — Progress Notes (Signed)
Urological Symptom Review  Patient is experiencing the following symptoms: Get up at night to urinate Trouble starting stream Weak stream Erection problems (male only)   Review of Systems  Gastrointestinal (upper)  : Indigestion/heartburn  Gastrointestinal (lower) : Negative for lower GI symptoms  Constitutional : Negative for symptoms  Skin: Negative for skin symptoms  Eyes: Negative for eye symptoms  Ear/Nose/Throat : Negative for Ear/Nose/Throat symptoms  Hematologic/Lymphatic: Easy bruising  Cardiovascular : Negative for cardiovascular symptoms  Respiratory : Negative for respiratory symptoms  Endocrine: Negative for endocrine symptoms  Musculoskeletal: Joint pain  Neurological: Dizziness  Psychologic: Depression

## 2020-01-28 NOTE — Progress Notes (Signed)
01/28/2020 4:22 PM   David Combs 12-28-1945 [id number]  Referring provider: Glenda Chroman, MD [place],  [place] [address]  Weak Stream  HPI: [name] is a 74yo here for evaluation of weak urinary stream and urge incontinence. For the the past 3 months he has noted a weaker stream, urinary urgency, Urge incontinence 2-3x per week, nocturia 2-3x, dribbling. No hx of UTI. NO recent dysuria or gross hematuria. UA is normal. Pat diagnosed with parkisons 3 years ago.  The patient has erectile dysfunction which he takes viagra with good results. He has delayed climax for the past several months.    PMH: Past Medical History:  Diagnosis Date  . Agent orange exposure   . Anxiety   . Arthritis   . Atrial fibrillation ([place])   . Chronic lower back pain   . Coronary atherosclerosis of native coronary artery    70-80% small diagonal - medically managed, LVEF 55%  . Depression   . Dermatophytosis of nail   . ED (erectile dysfunction)   . Essential hypertension   . GERD (gastroesophageal reflux disease)   . History of colonic polyps 10/08/2017  . History of pneumonia   . Lumbago   . Mixed hyperlipidemia   . Non-ST elevation myocardial infarction (NSTEMI) ([place]) 05/2010  . Obesity   . Parkinson disease ([place])   . Paroxysmal atrial fibrillation ([facility])   . Prostatitis   . Rotator cuff tear, left   . Sleep apnea    does not use CPAP    Surgical History: Past Surgical History:  Procedure Laterality Date  . ANTERIOR CERVICAL DECOMP/DISCECTOMY FUSION    . APPENDECTOMY    . BACK SURGERY    . CARDIAC CATHETERIZATION  X 3  . CATARACT EXTRACTION W/ INTRAOCULAR LENS  IMPLANT, BILATERAL Bilateral   . CHOLECYSTECTOMY OPEN  05/2014   rt upper quadrant pain   . COLONOSCOPY N/A 11/29/2017   Procedure: COLONOSCOPY;  Surgeon: Rogene Houston, MD;  [facility];  Service: Endoscopy;  Laterality: N/A;  8:30  . ESOPHAGOGASTRODUODENOSCOPY N/A 12/02/2014   Procedure:  ESOPHAGOGASTRODUODENOSCOPY (EGD);  Surgeon: Rogene Houston, MD;  [facility];  Service: Endoscopy;  Laterality: N/A;  200  . FRACTURE SURGERY    . KNEE ARTHROSCOPY Right   . LEFT HEART CATH AND CORONARY ANGIOGRAPHY N/A 02/12/2018   Procedure: LEFT HEART CATH AND CORONARY ANGIOGRAPHY;  Surgeon: Troy Sine, MD;  Location: [facility];  Service: Cardiovascular;  Laterality: N/A;  . LUMBAR [place]    . POLYPECTOMY  11/29/2017   Procedure: POLYPECTOMY;  Surgeon: Rogene Houston, MD;  [facility];  Service: Endoscopy;;  transverse  . SHOULDER ARTHROSCOPY WITH ROTATOR CUFF REPAIR AND OPEN BICEPS TENODESIS Left 12/30/2018   Procedure: LEFT SHOULDER ARTHROSCOPY WITH ROTATOR CUFF REPAIR;  Surgeon: Netta Cedars, MD;  Location: [facility];  Service: Orthopedics;  Laterality: Left;  . TONSILLECTOMY  1958  . WRIST FRACTURE SURGERY Left 1994    Home Medications:  Allergies as of 01/28/2020      Reactions   Iodinated Diagnostic Agents Swelling   Sulfa Drugs Cross Reactors Itching      Medication List       Accurate as of January 28, 2020  4:22 PM. If you have any questions, ask your nurse or doctor.        apixaban 5 MG Tabs tablet Commonly known as: Eliquis Take 1 tablet (5 mg  total) by mouth 2 (two) times daily.   buPROPion 100 MG tablet Commonly known as: WELLBUTRIN Take 100 mg by mouth 2 (two) times daily.   carbidopa-levodopa 25-250 MG tablet Commonly known as: SINEMET IR Take 1 tablet by mouth 3 (three) times daily.   carbidopa-levodopa 50-200 MG tablet Commonly known as: SINEMET CR Take 1 tablet by mouth at bedtime.   carboxymethylcellulose 0.5 % Soln Commonly known as: REFRESH PLUS Place 1 drop into both eyes 3 (three) times daily as needed (for dry eyes).   docusate sodium 100 MG capsule Commonly known as: COLACE Take 100 mg by mouth daily.   donepezil 10 MG tablet Commonly known as: ARICEPT Take 10 mg by mouth at  bedtime.   glucosamine-chondroitin 500-400 MG tablet Take 1 tablet by mouth 2 (two) times daily.   melatonin 3 MG Tabs tablet Take 6 mg by mouth at bedtime.   memantine 10 MG tablet Commonly known as: NAMENDA Take 10 mg by mouth 2 (two) times daily.   metoprolol tartrate 25 MG tablet Commonly known as: LOPRESSOR Take 0.5 tablets (12.5 mg total) by mouth 2 (two) times daily.   nitroGLYCERIN 0.4 MG SL tablet Commonly known as: NITROSTAT Place 1 tablet (0.4 mg total) under the tongue every 5 (five) minutes as needed for chest pain.   omeprazole 20 MG capsule Commonly known as: PRILOSEC Take 20 mg by mouth 2 (two) times daily before a meal.   polyethylene glycol powder 17 GM/SCOOP powder Commonly known as: GLYCOLAX/MIRALAX Take 8.5 g by mouth daily as needed. What changed:   how much to take  reasons to take this   rosuvastatin 5 MG tablet Commonly known as: CRESTOR Take 2.5 mg by mouth daily.   sildenafil 50 MG tablet Commonly known as: VIAGRA Take 50 mg by mouth daily as needed for erectile dysfunction.   Tylenol 325 MG tablet Generic drug: acetaminophen Take 650 mg by mouth every 6 (six) hours as needed for moderate pain or headache.   Vitamin D3 25 MCG (1000 UT) Caps Take 1,000 Units by mouth daily.       Allergies:  Allergies  Allergen Reactions  . Iodinated Diagnostic Agents Swelling  . Sulfa Drugs Cross Reactors Itching    Family History: Family History  Problem Relation Age of Onset  . Lymphoma Sister     Social History:  reports that he quit smoking about 17 years ago. His smoking use included cigarettes. He has a 40.00 pack-year smoking history. He has never used smokeless tobacco. He reports previous alcohol use. He reports that he does not use drugs.  ROS: All other review of systems were reviewed and are negative except what is noted above in HPI  Physical Exam: BP 137/83   Pulse 66   Temp 97.9 F (36.6 C)   Ht 5\' 8"  (1.727 m)   Wt  220 lb (99.8 kg)   BMI 33.45 kg/m   Constitutional:  Alert and oriented, No acute distress. HEENT: [place] AT, moist mucus membranes.  Trachea midline, no masses. Cardiovascular: No clubbing, cyanosis, or edema. Respiratory: Normal respiratory effort, no increased work of breathing. GI: Abdomen is soft, nontender, nondistended, no abdominal masses GU: No CVA tenderness. Circumcised phallus. No masses/lesions on penis, testis, scrotum. Prostate 40g smooth no nodules no induration.  Lymph: No cervical or inguinal lymphadenopathy. Skin: No rashes, bruises or suspicious lesions. Neurologic: Grossly intact, no focal deficits, moving all 4 extremities. Psychiatric: Normal mood and affect.  Laboratory Data: Lab Results  Component Value Date   WBC 6.7 08/25/2019   HGB 15.4 08/25/2019   HCT 44.6 08/25/2019   MCV 98.7 08/25/2019   PLT 185 08/25/2019    Lab Results  Component Value Date   CREATININE 0.86 08/25/2019    No results found for: PSA  No results found for: TESTOSTERONE  No results found for: HGBA1C  Urinalysis    Component Value Date/Time   COLORURINE YELLOW 04/30/2019 2040   APPEARANCEUR HAZY (A) 04/30/2019 2040   LABSPEC 1.023 04/30/2019 2040   PHURINE 7.0 04/30/2019 2040   GLUCOSEU NEGATIVE 04/30/2019 2040   HGBUR SMALL (A) 04/30/2019 2040   BILIRUBINUR small 01/28/2020 1612   KETONESUR 5 (A) 04/30/2019 2040   PROTEINUR Negative 01/28/2020 1612   PROTEINUR NEGATIVE 04/30/2019 2040   UROBILINOGEN negative (A) 01/28/2020 1612   NITRITE neg 01/28/2020 1612   NITRITE POSITIVE (A) 04/30/2019 2040   LEUKOCYTESUR Negative 01/28/2020 1612   LEUKOCYTESUR MODERATE (A) 04/30/2019 2040    Lab Results  Component Value Date   BACTERIA RARE (A) 04/30/2019    Pertinent Imaging:  No results found for this or any previous visit.  No results found for this or any previous visit.  No results found for this or any previous visit.  No results found for this or any previous  visit.  No results found for this or any previous visit.  No results found for this or any previous visit.  No results found for this or any previous visit.  No results found for this or any previous visit.   Assessment & Plan:    1. Urinary incontinence, unspecified type -Uroxatral 10mg  qhs, RTC 6 weeks - POCT urinalysis dipstick - BLADDER SCAN AMB NON-IMAGING  2. BPh with LUTS, weak stream -We will trial uroxatral 10mg  qhs   No follow-ups on file.  Nicolette Bang, MD  [facility] [place]

## 2020-02-18 ENCOUNTER — Telehealth: Payer: Self-pay | Admitting: Urology

## 2020-02-18 ENCOUNTER — Other Ambulatory Visit: Payer: Self-pay | Admitting: *Deleted

## 2020-02-18 DIAGNOSIS — I10 Essential (primary) hypertension: Secondary | ICD-10-CM

## 2020-02-18 DIAGNOSIS — Z8679 Personal history of other diseases of the circulatory system: Secondary | ICD-10-CM

## 2020-02-18 DIAGNOSIS — Z79899 Other long term (current) drug therapy: Secondary | ICD-10-CM

## 2020-02-18 NOTE — Telephone Encounter (Signed)
Pts wife called and asked for a return call from the nurse.

## 2020-02-18 NOTE — Progress Notes (Signed)
Patient says he will have lab work done about one week prior to 03/23/20 visit at [facility]. Orders faxed

## 2020-02-18 NOTE — Telephone Encounter (Signed)
Pt reports dizziness with alfuzosin and has stopped the medication. Pt asking is there anything else he could try

## 2020-02-24 ENCOUNTER — Other Ambulatory Visit: Payer: Self-pay

## 2020-02-24 DIAGNOSIS — N401 Enlarged prostate with lower urinary tract symptoms: Secondary | ICD-10-CM

## 2020-02-24 MED ORDER — SILODOSIN 4 MG PO CAPS: 4.0000 mg | ORAL_CAPSULE | Freq: Every day | ORAL | 5 refills | Status: DC

## 2020-02-24 NOTE — Telephone Encounter (Signed)
Please send rapaglo 4mg  daily

## 2020-02-24 NOTE — Telephone Encounter (Signed)
New rx sent in. Wife notified of rx change

## 2020-03-10 ENCOUNTER — Encounter: Payer: Self-pay | Admitting: Urology

## 2020-03-10 ENCOUNTER — Ambulatory Visit (INDEPENDENT_AMBULATORY_CARE_PROVIDER_SITE_OTHER): Payer: MEDICARE | Admitting: Urology

## 2020-03-10 ENCOUNTER — Other Ambulatory Visit: Payer: Self-pay

## 2020-03-10 VITALS — BP 101/65 | HR 60 | Temp 97.6°F | Ht 68.0 in | Wt 220.0 lb

## 2020-03-10 DIAGNOSIS — R3912 Poor urinary stream: Secondary | ICD-10-CM | POA: Diagnosis not present

## 2020-03-10 DIAGNOSIS — I25119 Atherosclerotic heart disease of native coronary artery with unspecified angina pectoris: Secondary | ICD-10-CM

## 2020-03-10 DIAGNOSIS — N401 Enlarged prostate with lower urinary tract symptoms: Secondary | ICD-10-CM

## 2020-03-10 DIAGNOSIS — N138 Other obstructive and reflux uropathy: Secondary | ICD-10-CM | POA: Diagnosis not present

## 2020-03-10 DIAGNOSIS — N5201 Erectile dysfunction due to arterial insufficiency: Secondary | ICD-10-CM

## 2020-03-10 LAB — MICROSCOPIC EXAMINATION
Bacteria, UA: NONE SEEN
Epithelial Cells (non renal): NONE SEEN /hpf (ref 0–10)
Renal Epithel, UA: NONE SEEN /hpf
WBC, UA: NONE SEEN /hpf (ref 0–5)

## 2020-03-10 LAB — URINALYSIS, ROUTINE W REFLEX MICROSCOPIC
Bilirubin, UA: NEGATIVE
Glucose, UA: NEGATIVE
Leukocytes,UA: NEGATIVE
Nitrite, UA: NEGATIVE
RBC, UA: NEGATIVE
Specific Gravity, UA: 1.025 (ref 1.005–1.030)
Urobilinogen, Ur: 0.2 mg/dL (ref 0.2–1.0)
pH, UA: 5.5 (ref 5.0–7.5)

## 2020-03-10 LAB — BLADDER SCAN AMB NON-IMAGING: Scan Result: 16.1

## 2020-03-10 MED ORDER — ALPROSTADIL (VASODILATOR) 1000 MCG UR PLLT: 1000.0000 ug | PELLET | URETHRAL | 12 refills | Status: DC | PRN

## 2020-03-10 MED ORDER — SILODOSIN 4 MG PO CAPS: 4.0000 mg | ORAL_CAPSULE | Freq: Every day | ORAL | 5 refills | Status: DC

## 2020-03-10 NOTE — Patient Instructions (Signed)

## 2020-03-10 NOTE — Progress Notes (Signed)
Urological Symptom Review  Patient is experiencing the following symptoms: Frequent urination Get up at night to urinate Trouble starting stream Weak stream Erection problems (male only)   Review of Systems  Gastrointestinal (upper)  : Negative for upper GI symptoms  Gastrointestinal (lower) : Negative for lower GI symptoms  Constitutional : Negative for symptoms  Skin: Negative for skin symptoms  Eyes: Negative for eye symptoms  Ear/Nose/Throat : Sinus problems  Hematologic/Lymphatic: Easy bruising  Cardiovascular : negative  Respiratory : Negative for respiratory symptoms  Endocrine: Negative for endocrine symptoms  Musculoskeletal: Back pain Joint pain  Neurological: Negative for neurological symptoms  Psychologic: Depression  Anxiety

## 2020-03-10 NOTE — Progress Notes (Signed)
03/10/2020 3:35 PM   [name] May 22, 1946 [id number]  Referring provider: Glenda Chroman, MD [place],  [place] [address]  Weak urinary stream  HPI: Mr Palau is a 74yo here for followup for weak urinary stream. He was started on rapaflo 3 weeks ago and noted significant improvement in his weak stream, incontinence resolved. No urgency or frequency. He also has issues getting and maintaining an erection. He previously tried viagra which worked  initially then lost its effectiveness.   PMH: Past Medical History:  Diagnosis Date  . Agent orange exposure   . Anxiety   . Arthritis   . Atrial fibrillation ([place])   . Chronic lower back pain   . Coronary atherosclerosis of native coronary artery    70-80% small diagonal - medically managed, LVEF 55%  . Depression   . Dermatophytosis of nail   . ED (erectile dysfunction)   . Essential hypertension   . GERD (gastroesophageal reflux disease)   . History of colonic polyps 10/08/2017  . History of pneumonia   . Lumbago   . Mixed hyperlipidemia   . Non-ST elevation myocardial infarction (NSTEMI) ([place]) 05/2010  . Obesity   . Parkinson disease ([place])   . Paroxysmal atrial fibrillation ([facility])   . Prostatitis   . Rotator cuff tear, left   . Sleep apnea    does not use CPAP    Surgical History: Past Surgical History:  Procedure Laterality Date  . ANTERIOR CERVICAL DECOMP/DISCECTOMY FUSION    . APPENDECTOMY    . BACK SURGERY    . CARDIAC CATHETERIZATION  X 3  . CATARACT EXTRACTION W/ INTRAOCULAR LENS  IMPLANT, BILATERAL Bilateral   . CHOLECYSTECTOMY OPEN  05/2014   rt upper quadrant pain   . COLONOSCOPY N/A 11/29/2017   Procedure: COLONOSCOPY;  Surgeon: Rogene Houston, MD;  [facility];  Service: Endoscopy;  Laterality: N/A;  8:30  . ESOPHAGOGASTRODUODENOSCOPY N/A 12/02/2014   Procedure: ESOPHAGOGASTRODUODENOSCOPY (EGD);  Surgeon: Rogene Houston, MD;  [facility];  Service: Endoscopy;  Laterality:  N/A;  200  . FRACTURE SURGERY    . KNEE ARTHROSCOPY Right   . LEFT HEART CATH AND CORONARY ANGIOGRAPHY N/A 02/12/2018   Procedure: LEFT HEART CATH AND CORONARY ANGIOGRAPHY;  Surgeon: Troy Sine, MD;  Location: [place] [facility];  Service: Cardiovascular;  Laterality: N/A;  . LUMBAR [place]    . POLYPECTOMY  11/29/2017   Procedure: POLYPECTOMY;  Surgeon: Rogene Houston, MD;  [facility];  Service: Endoscopy;;  transverse  . SHOULDER ARTHROSCOPY WITH ROTATOR CUFF REPAIR AND OPEN BICEPS TENODESIS Left 12/30/2018   Procedure: LEFT SHOULDER ARTHROSCOPY WITH ROTATOR CUFF REPAIR;  Surgeon: Netta Cedars, MD;  Location: [facility];  Service: Orthopedics;  Laterality: Left;  . TONSILLECTOMY  1958  . WRIST FRACTURE SURGERY Left 1994    Home Medications:  Allergies as of 03/10/2020      Reactions   Iodinated Diagnostic Agents Swelling   Sulfa Drugs Cross Reactors Itching      Medication List       Accurate as of March 10, 2020  3:35 PM. If you have any questions, ask your nurse or doctor.        apixaban 5 MG Tabs tablet Commonly known as: Eliquis Take 1 tablet (5 mg total) by mouth 2 (two) times daily.   buPROPion 100 MG tablet Commonly known as: WELLBUTRIN Take 100 mg by mouth 2 (two)  times daily.   carbidopa-levodopa 25-250 MG tablet Commonly known as: SINEMET IR Take 1 tablet by mouth 3 (three) times daily.   carbidopa-levodopa 50-200 MG tablet Commonly known as: SINEMET CR Take 1 tablet by mouth at bedtime.   carboxymethylcellulose 0.5 % Soln Commonly known as: REFRESH PLUS Place 1 drop into both eyes 3 (three) times daily as needed (for dry eyes).   docusate sodium 100 MG capsule Commonly known as: COLACE Take 100 mg by mouth daily.   donepezil 10 MG tablet Commonly known as: ARICEPT Take 10 mg by mouth at bedtime.   glucosamine-chondroitin 500-400 MG tablet Take 1 tablet by mouth 2 (two) times daily.   melatonin 3 MG Tabs  tablet Take 6 mg by mouth at bedtime.   memantine 10 MG tablet Commonly known as: NAMENDA Take 10 mg by mouth 2 (two) times daily.   metoprolol tartrate 25 MG tablet Commonly known as: LOPRESSOR Take 0.5 tablets (12.5 mg total) by mouth 2 (two) times daily.   nitroGLYCERIN 0.4 MG SL tablet Commonly known as: NITROSTAT Place 1 tablet (0.4 mg total) under the tongue every 5 (five) minutes as needed for chest pain.   omeprazole 20 MG capsule Commonly known as: PRILOSEC Take 20 mg by mouth 2 (two) times daily before a meal.   polyethylene glycol powder 17 GM/SCOOP powder Commonly known as: GLYCOLAX/MIRALAX Take 8.5 g by mouth daily as needed. What changed:   how much to take  reasons to take this   rosuvastatin 5 MG tablet Commonly known as: CRESTOR Take 2.5 mg by mouth daily.   sildenafil 50 MG tablet Commonly known as: VIAGRA Take 50 mg by mouth daily as needed for erectile dysfunction.   silodosin 4 MG Caps capsule Commonly known as: Rapaflo Take 1 capsule (4 mg total) by mouth daily with breakfast.   Tylenol 325 MG tablet Generic drug: acetaminophen Take 650 mg by mouth every 6 (six) hours as needed for moderate pain or headache.   Vitamin D3 25 MCG (1000 UT) Caps Take 1,000 Units by mouth daily.       Allergies:  Allergies  Allergen Reactions  . Iodinated Diagnostic Agents Swelling  . Sulfa Drugs Cross Reactors Itching    Family History: Family History  Problem Relation Age of Onset  . Lymphoma Sister     Social History:  reports that he quit smoking about 17 years ago. His smoking use included cigarettes. He has a 40.00 pack-year smoking history. He has never used smokeless tobacco. He reports previous alcohol use. He reports that he does not use drugs.  ROS: All other review of systems were reviewed and are negative except what is noted above in HPI  Physical Exam: BP 101/65   Pulse 60   Temp 97.6 F (36.4 C)   Ht 5\' 8"  (1.727 m)   Wt 220  lb (99.8 kg)   BMI 33.45 kg/m   Constitutional:  Alert and oriented, No acute distress. HEENT: [place] AT, moist mucus membranes.  Trachea midline, no masses. Cardiovascular: No clubbing, cyanosis, or edema. Respiratory: Normal respiratory effort, no increased work of breathing. GI: Abdomen is soft, nontender, nondistended, no abdominal masses GU: No CVA tenderness.  Lymph: No cervical or inguinal lymphadenopathy. Skin: No rashes, bruises or suspicious lesions. Neurologic: Grossly intact, no focal deficits, moving all 4 extremities. Psychiatric: Normal mood and affect.  Laboratory Data: Lab Results  Component Value Date   WBC 6.7 08/25/2019   HGB 15.4 08/25/2019   HCT 44.6  08/25/2019   MCV 98.7 08/25/2019   PLT 185 08/25/2019    Lab Results  Component Value Date   CREATININE 0.86 08/25/2019    No results found for: PSA  No results found for: TESTOSTERONE  No results found for: HGBA1C  Urinalysis    Component Value Date/Time   COLORURINE YELLOW 04/30/2019 2040   APPEARANCEUR Clear 03/10/2020 1443   LABSPEC 1.023 04/30/2019 2040   PHURINE 7.0 04/30/2019 2040   GLUCOSEU Negative 03/10/2020 1443   HGBUR SMALL (A) 04/30/2019 2040   BILIRUBINUR Negative 03/10/2020 1443   KETONESUR 5 (A) 04/30/2019 2040   PROTEINUR Trace (A) 03/10/2020 1443   PROTEINUR NEGATIVE 04/30/2019 2040   UROBILINOGEN negative (A) 01/28/2020 1612   NITRITE Negative 03/10/2020 1443   NITRITE POSITIVE (A) 04/30/2019 2040   LEUKOCYTESUR Negative 03/10/2020 1443   LEUKOCYTESUR MODERATE (A) 04/30/2019 2040    Lab Results  Component Value Date   LABMICR See below: 03/10/2020   WBCUA None seen 03/10/2020   LABEPIT None seen 03/10/2020   MUCUS Present 03/10/2020   BACTERIA None seen 03/10/2020    Pertinent Imaging:  No results found for this or any previous visit.  No results found for this or any previous visit.  No results found for this or any previous visit.  No results found for this or  any previous visit.  No results found for this or any previous visit.  No results found for this or any previous visit.  No results found for this or any previous visit.  No results found for this or any previous visit.   Assessment & Plan:    1. Benign prostatic hyperplasia with urinary obstruction -Continue rapaflo 4mg  - Urinalysis, Routine w reflex microscopic - BLADDER SCAN AMB NON-IMAGING  2. Weak urinary stream -COntinue rapaflo 4mg    3. Erectile dysfunction -patient defers treatment at this time   No follow-ups on file.  Nicolette Bang, MD  [facility] [place]

## 2020-03-11 DIAGNOSIS — Z8679 Personal history of other diseases of the circulatory system: Secondary | ICD-10-CM | POA: Diagnosis not present

## 2020-03-11 DIAGNOSIS — N400 Enlarged prostate without lower urinary tract symptoms: Secondary | ICD-10-CM | POA: Diagnosis not present

## 2020-03-11 DIAGNOSIS — I4891 Unspecified atrial fibrillation: Secondary | ICD-10-CM | POA: Diagnosis not present

## 2020-03-11 DIAGNOSIS — Z299 Encounter for prophylactic measures, unspecified: Secondary | ICD-10-CM | POA: Diagnosis not present

## 2020-03-11 DIAGNOSIS — Z79899 Other long term (current) drug therapy: Secondary | ICD-10-CM | POA: Diagnosis not present

## 2020-03-11 DIAGNOSIS — I10 Essential (primary) hypertension: Secondary | ICD-10-CM | POA: Diagnosis not present

## 2020-03-11 DIAGNOSIS — G20 Parkinson's disease: Secondary | ICD-10-CM | POA: Diagnosis not present

## 2020-03-11 LAB — CBC
HCT: 45.9 % (ref 38.5–50.0)
Hemoglobin: 15.1 g/dL (ref 13.2–17.1)
MCH: 33 pg (ref 27.0–33.0)
MCHC: 32.9 g/dL (ref 32.0–36.0)
MCV: 100.2 fL — ABNORMAL HIGH (ref 80.0–100.0)
MPV: 11.7 fL (ref 7.5–12.5)
Platelets: 165 10*3/uL (ref 140–400)
RBC: 4.58 10*6/uL (ref 4.20–5.80)
RDW: 11.8 % (ref 11.0–15.0)
WBC: 6 10*3/uL (ref 3.8–10.8)

## 2020-03-11 LAB — BASIC METABOLIC PANEL
BUN: 12 mg/dL (ref 7–25)
CO2: 31 mmol/L (ref 20–32)
Calcium: 9.8 mg/dL (ref 8.6–10.3)
Chloride: 104 mmol/L (ref 98–110)
Creat: 0.98 mg/dL (ref 0.70–1.18)
Glucose, Bld: 105 mg/dL — ABNORMAL HIGH (ref 65–99)
Potassium: 5.1 mmol/L (ref 3.5–5.3)
Sodium: 141 mmol/L (ref 135–146)

## 2020-03-12 ENCOUNTER — Other Ambulatory Visit: Payer: Self-pay

## 2020-03-12 ENCOUNTER — Telehealth: Payer: Self-pay | Admitting: Urology

## 2020-03-12 DIAGNOSIS — N5201 Erectile dysfunction due to arterial insufficiency: Secondary | ICD-10-CM

## 2020-03-12 DIAGNOSIS — N138 Other obstructive and reflux uropathy: Secondary | ICD-10-CM

## 2020-03-12 MED ORDER — ALPROSTADIL (VASODILATOR) 1000 MCG UR PLLT: 1000.0000 ug | PELLET | URETHRAL | 12 refills | Status: DC | PRN

## 2020-03-12 MED ORDER — SILODOSIN 4 MG PO CAPS: 4.0000 mg | ORAL_CAPSULE | Freq: Every day | ORAL | 5 refills | Status: DC

## 2020-03-12 NOTE — Progress Notes (Signed)
Daughter called and requested prescriptions be [facility]ed [facility].  Alprostadil and silodosin rx faxed to [phone]

## 2020-03-12 NOTE — Telephone Encounter (Signed)
Pt daughter requested rx's be sent to [facility]. Rx faxed to [phone]

## 2020-03-12 NOTE — Telephone Encounter (Signed)
Pt called and asked if a nurse can call her regarding a medication issue.

## 2020-03-23 ENCOUNTER — Ambulatory Visit (INDEPENDENT_AMBULATORY_CARE_PROVIDER_SITE_OTHER): Payer: MEDICARE | Admitting: Cardiology

## 2020-03-23 ENCOUNTER — Encounter: Payer: Self-pay | Admitting: Cardiology

## 2020-03-23 VITALS — BP 114/80 | HR 54 | Ht 67.0 in | Wt 220.8 lb

## 2020-03-23 DIAGNOSIS — Z8679 Personal history of other diseases of the circulatory system: Secondary | ICD-10-CM

## 2020-03-23 DIAGNOSIS — Z79899 Other long term (current) drug therapy: Secondary | ICD-10-CM | POA: Diagnosis not present

## 2020-03-23 DIAGNOSIS — I25119 Atherosclerotic heart disease of native coronary artery with unspecified angina pectoris: Secondary | ICD-10-CM

## 2020-03-23 NOTE — Patient Instructions (Signed)
Medication Instructions:   Your physician recommends that you continue on your current medications as directed. Please refer to the Current Medication list given to you today.  Labwork:  Your physician recommends that you return for non-fasting lab work in: 6 months just before your next visit to check your BMET & CBC. Orders given during visit  Testing/Procedures:  NONE  Follow-Up:  Your physician recommends that you schedule a follow-up appointment in: 6 months.  Any Other Special Instructions Will Be Listed Below (If Applicable).  If you need a refill on your cardiac medications before your next appointment, please call your pharmacy.

## 2020-03-23 NOTE — Progress Notes (Signed)
Cardiology Office Note  Date: 03/23/2020   ID: Jac, Romulus [birth year], MRN [id number]  PCP:  Glenda Chroman, MD  Cardiologist:  Rozann Lesches, MD Electrophysiologist:  None   Chief Complaint  Patient presents with  . Cardiac follow-up    History of Present Illness: David Combs is a 75 y.o. male last seen in February.  He is here with his wife for a follow-up visit.  Overall doing reasonably well.  He has had some recent chest discomfort in the setting of emotional stress.  He and his wife have been trying to help his mother get into an assisted living facility, however she has been very resistant, has dementia at baseline and is a fall risk.  They are trying to work all of this out.  I reviewed his medications which are outlined below.  He does not report any palpitations.  I personally reviewed his ECG today which shows sinus bradycardia.  I reviewed his recent lab work as outlined below.  He does not report any bleeding problems.  Past Medical History:  Diagnosis Date  . Agent orange exposure   . Anxiety   . Arthritis   . Atrial fibrillation ([place])   . Chronic lower back pain   . Coronary atherosclerosis of native coronary artery    70-80% small diagonal - medically managed, LVEF 55%  . Depression   . Dermatophytosis of nail   . ED (erectile dysfunction)   . Essential hypertension   . GERD (gastroesophageal reflux disease)   . History of colonic polyps 10/08/2017  . History of pneumonia   . Lumbago   . Mixed hyperlipidemia   . Non-ST elevation myocardial infarction (NSTEMI) ([place]) 05/2010  . Obesity   . Parkinson disease ([place])   . Paroxysmal atrial fibrillation ([facility])   . Prostatitis   . Rotator cuff tear, left   . Sleep apnea    does not use CPAP    Past Surgical History:  Procedure Laterality Date  . ANTERIOR CERVICAL DECOMP/DISCECTOMY FUSION    . APPENDECTOMY    . BACK SURGERY    . CARDIAC CATHETERIZATION  X 3  . CATARACT EXTRACTION W/  INTRAOCULAR LENS  IMPLANT, BILATERAL Bilateral   . CHOLECYSTECTOMY OPEN  05/2014   rt upper quadrant pain   . COLONOSCOPY N/A 11/29/2017   Procedure: COLONOSCOPY;  Surgeon: Rogene Houston, MD;  [facility];  Service: Endoscopy;  Laterality: N/A;  8:30  . ESOPHAGOGASTRODUODENOSCOPY N/A 12/02/2014   Procedure: ESOPHAGOGASTRODUODENOSCOPY (EGD);  Surgeon: Rogene Houston, MD;  [facility];  Service: Endoscopy;  Laterality: N/A;  200  . FRACTURE SURGERY    . KNEE ARTHROSCOPY Right   . LEFT HEART CATH AND CORONARY ANGIOGRAPHY N/A 02/12/2018   Procedure: LEFT HEART CATH AND CORONARY ANGIOGRAPHY;  Surgeon: Troy Sine, MD;  Location: [facility];  Service: Cardiovascular;  Laterality: N/A;  . LUMBAR [place]    . POLYPECTOMY  11/29/2017   Procedure: POLYPECTOMY;  Surgeon: Rogene Houston, MD;  [facility];  Service: Endoscopy;;  transverse  . SHOULDER ARTHROSCOPY WITH ROTATOR CUFF REPAIR AND OPEN BICEPS TENODESIS Left 12/30/2018   Procedure: LEFT SHOULDER ARTHROSCOPY WITH ROTATOR CUFF REPAIR;  Surgeon: Netta Cedars, MD;  Location: [facility];  Service: Orthopedics;  Laterality: Left;  . TONSILLECTOMY  1958  . WRIST FRACTURE SURGERY Left 1994    Current Outpatient Medications  Medication Sig Dispense Refill  .  acetaminophen (TYLENOL) 325 MG tablet Take 650 mg by mouth every 6 (six) hours as needed for moderate pain or headache.     . alprostadil (MUSE) 1000 MCG pellet 1 each (1,000 mcg total) by Transurethral route as needed for erectile dysfunction. use no more than 3 times per week 6 each 12  . apixaban (ELIQUIS) 5 MG TABS tablet Take 1 tablet (5 mg total) by mouth 2 (two) times daily. 180 tablet 2  . buPROPion (WELLBUTRIN) 100 MG tablet Take 100 mg by mouth 2 (two) times daily.    . carbidopa-levodopa (SINEMET CR) 50-200 MG tablet Take 1 tablet by mouth at bedtime.     . carbidopa-levodopa (SINEMET IR) 25-250 MG tablet Take 1 tablet by  mouth 3 (three) times daily.     . carboxymethylcellulose (REFRESH PLUS) 0.5 % SOLN Place 1 drop into both eyes 3 (three) times daily as needed (for dry eyes).     . Cholecalciferol (VITAMIN D3) 1000 units CAPS Take 1,000 Units by mouth daily.     Marland Kitchen docusate sodium (COLACE) 100 MG capsule Take 100 mg by mouth daily.    Marland Kitchen donepezil (ARICEPT) 10 MG tablet Take 10 mg by mouth at bedtime.    Marland Kitchen glucosamine-chondroitin 500-400 MG tablet Take 1 tablet by mouth 2 (two) times daily.     . Melatonin 3 MG TABS Take 6 mg by mouth at bedtime.     . memantine (NAMENDA) 10 MG tablet Take 10 mg by mouth 2 (two) times daily.    . metoprolol tartrate (LOPRESSOR) 25 MG tablet Take 0.5 tablets (12.5 mg total) by mouth 2 (two) times daily. 30 tablet 0  . nitroGLYCERIN (NITROSTAT) 0.4 MG SL tablet Place 1 tablet (0.4 mg total) under the tongue every 5 (five) minutes as needed for chest pain. 75 tablet 3  . omeprazole (PRILOSEC) 20 MG capsule Take 20 mg by mouth 2 (two) times daily before a meal.     . polyethylene glycol powder (GLYCOLAX/MIRALAX) powder Take 8.5 g by mouth daily as needed. (Patient taking differently: Take 17 g by mouth daily as needed for mild constipation. ) 255 g 0  . rosuvastatin (CRESTOR) 5 MG tablet Take 2.5 mg by mouth daily.     . sildenafil (VIAGRA) 50 MG tablet Take 50 mg by mouth daily as needed for erectile dysfunction.    . silodosin (RAPAFLO) 4 MG CAPS capsule Take 1 capsule (4 mg total) by mouth daily with breakfast. 30 capsule 5   No current facility-administered medications for this visit.   Allergies:  Iodinated diagnostic agents and Sulfa drugs cross reactors   ROS:   No syncope.  Physical Exam: VS:  BP 114/80   Pulse (!) 54   Ht 5\' 7"  (1.702 m)   Wt 220 lb 12.8 oz (100.2 kg)   SpO2 98%   BMI 34.58 kg/m , BMI Body mass index is 34.58 kg/m.  Wt Readings from Last 3 Encounters:  03/23/20 220 lb 12.8 oz (100.2 kg)  03/10/20 220 lb (99.8 kg)  01/28/20 220 lb (99.8 kg)     General: Patient appears comfortable at rest. HEENT: Conjunctiva and lids normal, wearing a mask. Neck: Supple, no elevated JVP or carotid bruits, no thyromegaly. Lungs: Clear to auscultation, nonlabored breathing at rest. Cardiac: Regular rate and rhythm, no S3 or significant systolic murmur. Extremities: No pitting edema, distal pulses 2+.  ECG:  An ECG dated 04/30/2019 was personally reviewed today and demonstrated:  Sinus rhythm.  Recent Labwork:  04/30/2019: ALT 8; AST 14 03/11/2020: BUN 12; Creat 0.98; Hemoglobin 15.1; Platelets 165; Potassium 5.1; Sodium 141     Component Value Date/Time   CHOL 132 02/12/2018 0344   TRIG 84 02/12/2018 0344   HDL 50 02/12/2018 0344   CHOLHDL 2.6 02/12/2018 0344   VLDL 17 02/12/2018 0344   LDLCALC 65 02/12/2018 0344    Other Studies Reviewed Today:  Cardiac catheterization 02/12/2018:  Prox RCA to Mid RCA lesion is 20% stenosed.  Ost 2nd Mrg lesion is 50% stenosed.  Ost 1st Diag to 1st Diag lesion is 40% stenosed.  Prox LAD lesion is 20% stenosed.  Evidence for mild coronary calcification in all coronary arteries with 20% smooth LAD stenosis, 40% mid first diagonal stenosis; 50% proximal narrowing in the circumflex marginal branch, and large dominant RCA with smooth 20% mid stenosis.  LVEDP 19 mmHg.  Echocardiogram 02/09/2018: Study Conclusions  - Left ventricle: The cavity size was normal. Wall thickness was normal. Systolic function was vigorous. The estimated ejection fraction was in the range of 65% to 70%. Wall motion was normal; there were no regional wall motion abnormalities.  Assessment and Plan:  1.  History of atrial fibrillation, presently asymptomatic in terms of symptoms and in sinus rhythm by ECG today.  CHA2DS2-VASc score is 3 and he continues on Eliquis for stroke prophylaxis.  I reviewed his recent lab work.  No changes were made today.  2.  Mild to moderate nonobstructive CAD by cardiac catheterization  in July 2019.  Plan is to continue medical therapy and observation.  He is on beta-blocker and statin.  ECG reviewed and stable.  Medication Adjustments/Labs and Tests Ordered: Current medicines are reviewed at length with the patient today.  Concerns regarding medicines are outlined above.   Tests Ordered: Orders Placed This Encounter  Procedures  . Basic metabolic panel  . CBC  . EKG 12-Lead    Medication Changes: No orders of the defined types were placed in this encounter.   Disposition:  Follow up 6 months in the [place] office.  Signed, Satira Sark, MD, [facility] 03/23/2020 12:01 PM    [facility] [place], [place], [place] [address] Phone: [phone]; Fax: [phone]

## 2020-03-26 ENCOUNTER — Ambulatory Visit: Payer: MEDICARE | Admitting: Cardiology

## 2020-04-13 DIAGNOSIS — Z23 Encounter for immunization: Secondary | ICD-10-CM | POA: Diagnosis not present

## 2020-04-22 ENCOUNTER — Telehealth: Payer: Self-pay | Admitting: Cardiology

## 2020-04-22 NOTE — Telephone Encounter (Signed)
Patient was seen at the [facility] 04/21/2020 for a checkup. States that his BP was too low. Advised to contact Dr. Domenic Polite. Wanted to know if his BP medication needs to be changed.   # [phone].

## 2020-04-22 NOTE — Telephone Encounter (Signed)
Spoke with wife David Combs) - said BP at [place] was 96/60.  No c/o any cardiac symptoms.  Does have some balance issues due Parkinson, so hard to tell if having any dizziness.  No near syncopal episodes either.  Last seen in office on 03/23/20 by Dr. Domenic Polite - BP was 114/80.  Suggested that they keep BP & HR log over the next 10-14 days & call back with readings.  She verbalized understanding.

## 2020-04-29 DIAGNOSIS — I10 Essential (primary) hypertension: Secondary | ICD-10-CM | POA: Diagnosis not present

## 2020-04-29 DIAGNOSIS — E7849 Other hyperlipidemia: Secondary | ICD-10-CM | POA: Diagnosis not present

## 2020-04-29 DIAGNOSIS — M159 Polyosteoarthritis, unspecified: Secondary | ICD-10-CM | POA: Diagnosis not present

## 2020-04-29 DIAGNOSIS — I4891 Unspecified atrial fibrillation: Secondary | ICD-10-CM | POA: Diagnosis not present

## 2020-05-05 ENCOUNTER — Telehealth: Payer: Self-pay | Admitting: Cardiology

## 2020-05-05 MED ORDER — METOPROLOL TARTRATE 25 MG PO TABS: 12.5000 mg | ORAL_TABLET | Freq: Two times a day (BID) | ORAL | 0 refills | Status: DC | PRN

## 2020-05-05 NOTE — Telephone Encounter (Signed)
Thank you for the update.  He is on Lopressor 12.5 mg twice daily, this cannot be reduced any lower without stopping it altogether which we could consider.  It could be used if he developed palpitations or evidence of recurrent atrial fibrillation on an as-needed basis.  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  I agree with increasing fluid intake, if he is not using lower extremity compression stockings that would also be advisable.  Next step would be adding something like midodrine to help hold up blood pressure.

## 2020-05-05 NOTE — Telephone Encounter (Signed)
Patient informed and verbalized understanding of plan. 

## 2020-05-05 NOTE — Telephone Encounter (Signed)
Wife called to report home BP readings: 09/24 75/53 & HR 65 09/25 86/58 & HR 61 09/27 100/61 & HR 52 09/29 106/68 & HR 56 10/04 130/93 & 61  142/83 & HR 59 10/06 75/54 & 52  Reports home BP checks have been checked around same time each day, same arm and same cuff Reports BP is always low when he goes to a doctor. Reports seeing [facility] Doctor on yesterday and was told BP was too low but unsure of reading. Reports BP monitor has not been checked for accuracy. Advised to take home BP monitor to next doctor visit for comparison which is tomorrow with urologist. Reports dizziness. Denies chest pain or sob Reports eating well but doesn't drink enough fluids. Encouraged to increase fluid intake to prevent dehydration and help increase BP.  Medications reviewed. Reports taking last dose of rapaflo 4 mg four days ago. Advised metoprolol is the only medication that could lower BP but is used to regulate HR. Advised message would be sent to provider for recommendations.

## 2020-05-05 NOTE — Telephone Encounter (Signed)
Mrs. Salguero called stating that her husband to continues to have issues with low BP. She is wanting to speak to someone.

## 2020-05-06 ENCOUNTER — Ambulatory Visit (INDEPENDENT_AMBULATORY_CARE_PROVIDER_SITE_OTHER): Payer: MEDICARE | Admitting: Urology

## 2020-05-06 ENCOUNTER — Encounter: Payer: Self-pay | Admitting: Urology

## 2020-05-06 ENCOUNTER — Ambulatory Visit: Payer: MEDICARE | Admitting: Urology

## 2020-05-06 ENCOUNTER — Other Ambulatory Visit: Payer: Self-pay

## 2020-05-06 VITALS — BP 99/66 | HR 56 | Temp 97.7°F | Ht 67.0 in | Wt 220.8 lb

## 2020-05-06 DIAGNOSIS — N138 Other obstructive and reflux uropathy: Secondary | ICD-10-CM

## 2020-05-06 DIAGNOSIS — N401 Enlarged prostate with lower urinary tract symptoms: Secondary | ICD-10-CM

## 2020-05-06 DIAGNOSIS — R32 Unspecified urinary incontinence: Secondary | ICD-10-CM

## 2020-05-06 DIAGNOSIS — R3912 Poor urinary stream: Secondary | ICD-10-CM

## 2020-05-06 DIAGNOSIS — I25119 Atherosclerotic heart disease of native coronary artery with unspecified angina pectoris: Secondary | ICD-10-CM | POA: Diagnosis not present

## 2020-05-06 LAB — MICROSCOPIC EXAMINATION
Bacteria, UA: NONE SEEN
Epithelial Cells (non renal): NONE SEEN /hpf (ref 0–10)
Renal Epithel, UA: NONE SEEN /hpf
WBC, UA: NONE SEEN /hpf (ref 0–5)

## 2020-05-06 LAB — URINALYSIS, ROUTINE W REFLEX MICROSCOPIC
Bilirubin, UA: NEGATIVE
Glucose, UA: NEGATIVE
Leukocytes,UA: NEGATIVE
Nitrite, UA: NEGATIVE
RBC, UA: NEGATIVE
Specific Gravity, UA: >1.03 — ABNORMAL HIGH (ref 1.005–1.030)
Urobilinogen, Ur: 1 mg/dL (ref 0.2–1.0)
pH, UA: 5.5 (ref 5.0–7.5)

## 2020-05-06 LAB — BLADDER SCAN AMB NON-IMAGING: Scan Result: 2

## 2020-05-06 NOTE — Patient Instructions (Signed)

## 2020-05-06 NOTE — Progress Notes (Signed)
Urological Symptom Review  Patient is experiencing the following symptoms: Frequent urination Burning/pain with urination Get up at night to urinate Stream starts and stops Trouble starting stream Weak stream Erection problems (male only)   Review of Systems  Gastrointestinal (upper)  : Negative for upper GI symptoms  Gastrointestinal (lower) : Constipation  Constitutional : Negative for symptoms  Skin: Negative for skin symptoms  Eyes: Negative for eye symptoms  Ear/Nose/Throat : Sinus problems  Hematologic/Lymphatic: Easy bruising  Cardiovascular : Negative for cardiovascular symptoms  Respiratory : Negative for respiratory symptoms  Endocrine: Negative for endocrine symptoms  Musculoskeletal: Joint pain  Neurological: Dizziness  Psychologic: Depression

## 2020-05-06 NOTE — Progress Notes (Signed)
05/06/2020 9:37 AM   David Combs 06-10-1946 [id number]  Referring provider: Glenda Chroman, MD [place],  [place] [address]  followup urinary urgency  HPI: David Combs is a 74yo here for followup for BPH and weak stream. Rapaflo improved his stream but it was expensive. He has urinary urgency, frequency, nocturia 5-6x. Stream is fair currently. No dysuria. He continues to have urge incontinence   PMH: Past Medical History:  Diagnosis Date  . Agent orange exposure   . Anxiety   . Arthritis   . Atrial fibrillation ([place])   . Chronic lower back pain   . Coronary atherosclerosis of native coronary artery    70-80% small diagonal - medically managed, LVEF 55%  . Depression   . Dermatophytosis of nail   . ED (erectile dysfunction)   . Essential hypertension   . GERD (gastroesophageal reflux disease)   . History of colonic polyps 10/08/2017  . History of pneumonia   . Lumbago   . Mixed hyperlipidemia   . Non-ST elevation myocardial infarction (NSTEMI) ([place]) 05/2010  . Obesity   . Parkinson disease ([place])   . Paroxysmal atrial fibrillation ([facility])   . Prostatitis   . Rotator cuff tear, left   . Sleep apnea    does not use CPAP    Surgical History: Past Surgical History:  Procedure Laterality Date  . ANTERIOR CERVICAL DECOMP/DISCECTOMY FUSION    . APPENDECTOMY    . BACK SURGERY    . CARDIAC CATHETERIZATION  X 3  . CATARACT EXTRACTION W/ INTRAOCULAR LENS  IMPLANT, BILATERAL Bilateral   . CHOLECYSTECTOMY OPEN  05/2014   rt upper quadrant pain   . COLONOSCOPY N/A 11/29/2017   Procedure: COLONOSCOPY;  Surgeon: David Houston, MD;  [facility];  Service: Endoscopy;  Laterality: N/A;  8:30  . ESOPHAGOGASTRODUODENOSCOPY N/A 12/02/2014   Procedure: ESOPHAGOGASTRODUODENOSCOPY (EGD);  Surgeon: David Houston, MD;  [facility];  Service: Endoscopy;  Laterality: N/A;  200  . FRACTURE SURGERY    . KNEE ARTHROSCOPY Right   . LEFT HEART CATH AND CORONARY  ANGIOGRAPHY N/A 02/12/2018   Procedure: LEFT HEART CATH AND CORONARY ANGIOGRAPHY;  Surgeon: David Sine, MD;  Location: [place] [facility];  Service: Cardiovascular;  Laterality: N/A;  . LUMBAR [place]    . POLYPECTOMY  11/29/2017   Procedure: POLYPECTOMY;  Surgeon: David Houston, MD;  [facility];  Service: Endoscopy;;  transverse  . SHOULDER ARTHROSCOPY WITH ROTATOR CUFF REPAIR AND OPEN BICEPS TENODESIS Left 12/30/2018   Procedure: LEFT SHOULDER ARTHROSCOPY WITH ROTATOR CUFF REPAIR;  Surgeon: David Cedars, MD;  Location: [facility];  Service: Orthopedics;  Laterality: Left;  . TONSILLECTOMY  1958  . WRIST FRACTURE SURGERY Left 1994    Home Medications:  Allergies as of 05/06/2020      Reactions   Iodinated Diagnostic Agents Swelling   Sulfa Drugs Cross Reactors Itching      Medication List       Accurate as of May 06, 2020  9:37 AM. If you have any questions, ask your nurse or doctor.        alprostadil 1000 MCG pellet Commonly known as: Muse 1 each (1,000 mcg total) by Transurethral route as needed for erectile dysfunction. use no more than 3 times per week   apixaban 5 MG Tabs tablet Commonly known as: Eliquis Take 1 tablet (5 mg total) by mouth 2 (two) times daily.  buPROPion 100 MG tablet Commonly known as: WELLBUTRIN Take 100 mg by mouth 2 (two) times daily.   carbidopa-levodopa 25-250 MG tablet Commonly known as: SINEMET IR Take 1 tablet by mouth 3 (three) times daily.   carbidopa-levodopa 50-200 MG tablet Commonly known as: SINEMET CR Take 1 tablet by mouth at bedtime.   carboxymethylcellulose 0.5 % Soln Commonly known as: REFRESH PLUS Place 1 drop into both eyes 3 (three) times daily as needed (for dry eyes).   docusate sodium 100 MG capsule Commonly known as: COLACE Take 100 mg by mouth daily.   donepezil 10 MG tablet Commonly known as: ARICEPT Take 10 mg by mouth at bedtime.   glucosamine-chondroitin 500-400 MG  tablet Take 1 tablet by mouth 2 (two) times daily.   melatonin 3 MG Tabs tablet Take 6 mg by mouth at bedtime.   memantine 10 MG tablet Commonly known as: NAMENDA Take 10 mg by mouth 2 (two) times daily.   metoprolol tartrate 25 MG tablet Commonly known as: LOPRESSOR Take 0.5 tablets (12.5 mg total) by mouth 2 (two) times daily as needed (palpitations or recurrent atrial fibrillation).   nitroGLYCERIN 0.4 MG SL tablet Commonly known as: NITROSTAT Place 1 tablet (0.4 mg total) under the tongue every 5 (five) minutes as needed for chest pain.   omeprazole 20 MG capsule Commonly known as: PRILOSEC Take 20 mg by mouth 2 (two) times daily before a meal.   polyethylene glycol powder 17 GM/SCOOP powder Commonly known as: GLYCOLAX/MIRALAX Take 8.5 g by mouth daily as needed. What changed:   how much to take  reasons to take this   rosuvastatin 5 MG tablet Commonly known as: CRESTOR Take 2.5 mg by mouth daily.   sildenafil 50 MG tablet Commonly known as: VIAGRA Take 50 mg by mouth daily as needed for erectile dysfunction.   Tylenol 325 MG tablet Generic drug: acetaminophen Take 650 mg by mouth every 6 (six) hours as needed for moderate pain or headache.   Vitamin D3 25 MCG (1000 UT) Caps Take 1,000 Units by mouth daily.       Allergies:  Allergies  Allergen Reactions  . Iodinated Diagnostic Agents Swelling  . Sulfa Drugs Cross Reactors Itching    Family History: Family History  Problem Relation Age of Onset  . Lymphoma Sister     Social History:  reports that he quit smoking about 17 years ago. His smoking use included cigarettes. He has a 40.00 pack-year smoking history. He has never used smokeless tobacco. He reports previous alcohol use. He reports that he does not use drugs.  ROS: All other review of systems were reviewed and are negative except what is noted above in HPI  Physical Exam: BP 99/66   Pulse (!) 56   Temp 97.7 F (36.5 C)   Ht 5\' 7"   (1.702 m)   Wt 220 lb 12.8 oz (100.2 kg)   BMI 34.58 kg/m   Constitutional:  Alert and oriented, No acute distress. HEENT: [place] AT, moist mucus membranes.  Trachea midline, no masses. Cardiovascular: No clubbing, cyanosis, or edema. Respiratory: Normal respiratory effort, no increased work of breathing. GI: Abdomen is soft, nontender, nondistended, no abdominal masses GU: No CVA tenderness.  Lymph: No cervical or inguinal lymphadenopathy. Skin: No rashes, bruises or suspicious lesions. Neurologic: Grossly intact, no focal deficits, moving all 4 extremities. Psychiatric: Normal mood and affect.  Laboratory Data: Lab Results  Component Value Date   WBC 6.0 03/11/2020   HGB 15.1 03/11/2020  HCT 45.9 03/11/2020   MCV 100.2 (H) 03/11/2020   PLT 165 03/11/2020    Lab Results  Component Value Date   CREATININE 0.98 03/11/2020    No results found for: PSA  No results found for: TESTOSTERONE  No results found for: HGBA1C  Urinalysis    Component Value Date/Time   COLORURINE YELLOW 04/30/2019 2040   APPEARANCEUR Clear 03/10/2020 1443   LABSPEC 1.023 04/30/2019 2040   PHURINE 7.0 04/30/2019 2040   GLUCOSEU Negative 03/10/2020 1443   HGBUR SMALL (A) 04/30/2019 2040   BILIRUBINUR Negative 03/10/2020 1443   KETONESUR 5 (A) 04/30/2019 2040   PROTEINUR Trace (A) 03/10/2020 1443   PROTEINUR NEGATIVE 04/30/2019 2040   UROBILINOGEN negative (A) 01/28/2020 1612   NITRITE Negative 03/10/2020 1443   NITRITE POSITIVE (A) 04/30/2019 2040   LEUKOCYTESUR Negative 03/10/2020 1443   LEUKOCYTESUR MODERATE (A) 04/30/2019 2040    Lab Results  Component Value Date   LABMICR See below: 03/10/2020   WBCUA None seen 03/10/2020   LABEPIT None seen 03/10/2020   MUCUS Present 03/10/2020   BACTERIA None seen 03/10/2020    Pertinent Imaging:  No results found for this or any previous visit.  No results found for this or any previous visit.  No results found for this or any previous  visit.  No results found for this or any previous visit.  No results found for this or any previous visit.  No results found for this or any previous visit.  No results found for this or any previous visit.  No results found for this or any previous visit.   Assessment & Plan:    1. Benign prostatic hyperplasia with urinary obstruction -We will trial uroxatral 10mg  qhs - Urinalysis, Routine w reflex microscopic  2. Weak urinary stream -uroxatral 10mg  qhs  3. Urinary incontinence -We will start uroxtral and if this fails to improve his incontinence we will start mirabegron   No follow-ups on file.  Nicolette Bang, MD  [facility] [place]

## 2020-05-10 ENCOUNTER — Other Ambulatory Visit: Payer: Self-pay

## 2020-05-10 DIAGNOSIS — N138 Other obstructive and reflux uropathy: Secondary | ICD-10-CM

## 2020-05-10 MED ORDER — MIRABEGRON ER 25 MG PO TB24: 25.0000 mg | ORAL_TABLET | Freq: Every day | ORAL | 1 refills | Status: DC

## 2020-05-10 MED ORDER — ALFUZOSIN HCL ER 10 MG PO TB24: 10.0000 mg | ORAL_TABLET | Freq: Every evening | ORAL | 3 refills | Status: DC

## 2020-05-10 NOTE — Progress Notes (Signed)
Pt wife called this am after receiving a call from the pharmacy of the prescription that was sent in last week for pt. Pt is unable to tolerate alfuzosin. I spoke with Dr. Alyson Ingles new order for myrbetriq 25mg  1 tablet by mouth daily order received.  I called and spoke with wife and new prescription sent in for pt.

## 2020-05-12 ENCOUNTER — Other Ambulatory Visit: Payer: Self-pay

## 2020-05-14 MED ORDER — ALFUZOSIN HCL ER 10 MG PO TB24: 10.0000 mg | ORAL_TABLET | Freq: Every day | ORAL | 11 refills | Status: DC

## 2020-05-28 DIAGNOSIS — I4891 Unspecified atrial fibrillation: Secondary | ICD-10-CM | POA: Diagnosis not present

## 2020-05-28 DIAGNOSIS — M159 Polyosteoarthritis, unspecified: Secondary | ICD-10-CM | POA: Diagnosis not present

## 2020-05-28 DIAGNOSIS — I10 Essential (primary) hypertension: Secondary | ICD-10-CM | POA: Diagnosis not present

## 2020-05-28 DIAGNOSIS — E7849 Other hyperlipidemia: Secondary | ICD-10-CM | POA: Diagnosis not present

## 2020-06-01 ENCOUNTER — Telehealth: Payer: Self-pay | Admitting: Cardiology

## 2020-06-01 NOTE — Telephone Encounter (Signed)
Patient does not need to hold anticoagulation for molar extraction

## 2020-06-01 NOTE — Telephone Encounter (Signed)
° °  [facility] Pre-operative Risk Assessment    HEARTCARE STAFF: - Please ensure there is not already an duplicate clearance open for this procedure. - Under Visit Info/Reason for Call, type in Other and utilize the format Clearance MM/DD/YY or Clearance TBD. Do not use dashes or single digits. - If request is for dental extraction, please clarify the # of teeth to be extracted.  Request for surgical clearance:  1. What type of surgery is being performed? 1. Mandibular Molar Extraction- potential need to remove surrounding bone with surgical handpiece  2. Extraction-Simple  2. When is this surgery scheduled?  1. TBD  3. What type of clearance is required (medical clearance vs. Pharmacy clearance to hold med vs. Both)?  1. Pharmacy clearance  4. Are there any medications that need to be held prior to surgery and how long? 1. Pt is on blood thinners   5. Practice name and name of physician performing surgery?  1. Raford Pitcher - Dentist with [facility]   6. What is the office phone number?  1. [phone]   7.   What is the office fax number?   1.   [phone]  8.   Anesthesia type (None, local, MAC, general) ?   1.   Local Anesthetic with Epinephrine    Desma Paganini 06/01/2020, 1:27 PM  _________________________________________________________________   (provider comments below)

## 2020-06-01 NOTE — Telephone Encounter (Signed)
Spoke with dentist office, patient is having one molar extraction and subsequent filling. Will check with our clinical pharmacist to make sure Eliquis does not need to be held even for molar extraction.

## 2020-06-02 ENCOUNTER — Other Ambulatory Visit: Payer: Self-pay

## 2020-06-02 ENCOUNTER — Encounter: Payer: Self-pay | Admitting: Urology

## 2020-06-02 ENCOUNTER — Ambulatory Visit (INDEPENDENT_AMBULATORY_CARE_PROVIDER_SITE_OTHER): Payer: MEDICARE | Admitting: Urology

## 2020-06-02 VITALS — BP 99/66 | HR 71 | Temp 98.5°F | Ht 67.0 in | Wt 220.8 lb

## 2020-06-02 DIAGNOSIS — N138 Other obstructive and reflux uropathy: Secondary | ICD-10-CM | POA: Diagnosis not present

## 2020-06-02 DIAGNOSIS — N3281 Overactive bladder: Secondary | ICD-10-CM | POA: Diagnosis not present

## 2020-06-02 DIAGNOSIS — N401 Enlarged prostate with lower urinary tract symptoms: Secondary | ICD-10-CM | POA: Diagnosis not present

## 2020-06-02 DIAGNOSIS — R3912 Poor urinary stream: Secondary | ICD-10-CM

## 2020-06-02 DIAGNOSIS — I25119 Atherosclerotic heart disease of native coronary artery with unspecified angina pectoris: Secondary | ICD-10-CM | POA: Diagnosis not present

## 2020-06-02 MED ORDER — ALFUZOSIN HCL ER 10 MG PO TB24: 10.0000 mg | ORAL_TABLET | Freq: Every evening | ORAL | 11 refills | Status: DC

## 2020-06-02 MED ORDER — TROSPIUM CHLORIDE 20 MG PO TABS: 20.0000 mg | ORAL_TABLET | Freq: Two times a day (BID) | ORAL | 11 refills | Status: DC

## 2020-06-02 NOTE — Progress Notes (Signed)
06/02/2020 11:17 AM   David Combs 08/03/1945 [id number]  Referring provider: Glenda Chroman, MD [place],  [place] [address]  Followup weak urinary stream  HPI: [name] is a 74yo here for follouwp for BPH and weak urinary stream. He was given rx for alfuzosin and mirabegron last visit. He did not take the alfuzosin.  The mirabegron did decrease the nocturia to 1-2x. Stream remains weak. His has urinary frequency. No urge incontinence.  No dysuria or hematuria   PMH: Past Medical History:  Diagnosis Date  . Agent orange exposure   . Anxiety   . Arthritis   . Atrial fibrillation ([place])   . Chronic lower back pain   . Coronary atherosclerosis of native coronary artery    70-80% small diagonal - medically managed, LVEF 55%  . Depression   . Dermatophytosis of nail   . ED (erectile dysfunction)   . Essential hypertension   . GERD (gastroesophageal reflux disease)   . History of colonic polyps 10/08/2017  . History of pneumonia   . Lumbago   . Mixed hyperlipidemia   . Non-ST elevation myocardial infarction (NSTEMI) ([facility]) 05/2010  . Obesity   . Parkinson disease ([place])   . Paroxysmal atrial fibrillation ([facility])   . Prostatitis   . Rotator cuff tear, left   . Sleep apnea    does not use CPAP    Surgical History: Past Surgical History:  Procedure Laterality Date  . ANTERIOR CERVICAL DECOMP/DISCECTOMY FUSION    . APPENDECTOMY    . BACK SURGERY    . CARDIAC CATHETERIZATION  X 3  . CATARACT EXTRACTION W/ INTRAOCULAR LENS  IMPLANT, BILATERAL Bilateral   . CHOLECYSTECTOMY OPEN  05/2014   rt upper quadrant pain   . COLONOSCOPY N/A 11/29/2017   Procedure: COLONOSCOPY;  Surgeon: Rogene Houston, MD;  [facility];  Service: Endoscopy;  Laterality: N/A;  8:30  . ESOPHAGOGASTRODUODENOSCOPY N/A 12/02/2014   Procedure: ESOPHAGOGASTRODUODENOSCOPY (EGD);  Surgeon: Rogene Houston, MD;  [facility];  Service: Endoscopy;  Laterality: N/A;  200  . FRACTURE  SURGERY    . KNEE ARTHROSCOPY Right   . LEFT HEART CATH AND CORONARY ANGIOGRAPHY N/A 02/12/2018   Procedure: LEFT HEART CATH AND CORONARY ANGIOGRAPHY;  Surgeon: Troy Sine, MD;  Location: [facility];  Service: Cardiovascular;  Laterality: N/A;  . LUMBAR [place]    . POLYPECTOMY  11/29/2017   Procedure: POLYPECTOMY;  Surgeon: Rogene Houston, MD;  [facility];  Service: Endoscopy;;  transverse  . SHOULDER ARTHROSCOPY WITH ROTATOR CUFF REPAIR AND OPEN BICEPS TENODESIS Left 12/30/2018   Procedure: LEFT SHOULDER ARTHROSCOPY WITH ROTATOR CUFF REPAIR;  Surgeon: Netta Cedars, MD;  Location: [facility];  Service: Orthopedics;  Laterality: Left;  . TONSILLECTOMY  1958  . WRIST FRACTURE SURGERY Left 1994    Home Medications:  Allergies as of 06/02/2020      Reactions   Iodinated Diagnostic Agents Swelling   Sulfa Drugs Cross Reactors Itching      Medication List       Accurate as of June 02, 2020 11:17 AM. If you have any questions, ask your nurse or doctor.        STOP taking these medications   alfuzosin 10 MG 24 hr tablet Commonly known as: UROXATRAL Stopped by: Nicolette Bang, MD   alprostadil 1000 MCG pellet Commonly known as: Muse Stopped by: Nicolette Bang, MD   glucosamine-chondroitin  500-400 MG tablet Stopped by: Nicolette Bang, MD   metoprolol tartrate 25 MG tablet Commonly known as: LOPRESSOR Stopped by: Nicolette Bang, MD   sildenafil 50 MG tablet Commonly known as: VIAGRA Stopped by: Nicolette Bang, MD     TAKE these medications   amoxicillin 500 MG capsule Commonly known as: AMOXIL Take 500 mg by mouth 3 (three) times daily.   apixaban 5 MG Tabs tablet Commonly known as: Eliquis Take 1 tablet (5 mg total) by mouth 2 (two) times daily.   buPROPion 100 MG tablet Commonly known as: WELLBUTRIN Take 100 mg by mouth 2 (two) times daily.   carbidopa-levodopa 25-250 MG tablet Commonly known as: SINEMET  IR Take 1 tablet by mouth 3 (three) times daily.   carbidopa-levodopa 50-200 MG tablet Commonly known as: SINEMET CR Take 1 tablet by mouth at bedtime.   carboxymethylcellulose 0.5 % Soln Commonly known as: REFRESH PLUS Place 1 drop into both eyes 3 (three) times daily as needed (for dry eyes).   docusate sodium 100 MG capsule Commonly known as: COLACE Take 100 mg by mouth daily.   donepezil 10 MG tablet Commonly known as: ARICEPT Take 10 mg by mouth at bedtime.   ibuprofen 800 MG tablet Commonly known as: ADVIL Take 800 mg by mouth 3 (three) times daily.   melatonin 3 MG Tabs tablet Take 6 mg by mouth at bedtime.   memantine 10 MG tablet Commonly known as: NAMENDA Take 10 mg by mouth 2 (two) times daily.   mirabegron ER 25 MG Tb24 tablet Commonly known as: MYRBETRIQ Take 1 tablet (25 mg total) by mouth daily.   nitroGLYCERIN 0.4 MG SL tablet Commonly known as: NITROSTAT Place 1 tablet (0.4 mg total) under the tongue every 5 (five) minutes as needed for chest pain.   omeprazole 20 MG capsule Commonly known as: PRILOSEC Take 20 mg by mouth 2 (two) times daily before a meal.   polyethylene glycol powder 17 GM/SCOOP powder Commonly known as: GLYCOLAX/MIRALAX Take 8.5 g by mouth daily as needed. What changed:   how much to take  reasons to take this   rosuvastatin 5 MG tablet Commonly known as: CRESTOR Take 2.5 mg by mouth daily.   Tylenol 325 MG tablet Generic drug: acetaminophen Take 650 mg by mouth every 6 (six) hours as needed for moderate pain or headache.   Vitamin D3 25 MCG (1000 UT) Caps Take 1,000 Units by mouth daily.       Allergies:  Allergies  Allergen Reactions  . Iodinated Diagnostic Agents Swelling  . Sulfa Drugs Cross Reactors Itching    Family History: Family History  Problem Relation Age of Onset  . Lymphoma Sister     Social History:  reports that he quit smoking about 18 years ago. His smoking use included cigarettes. He  has a 40.00 pack-year smoking history. He has never used smokeless tobacco. He reports previous alcohol use. He reports that he does not use drugs.  ROS: All other review of systems were reviewed and are negative except what is noted above in HPI  Physical Exam: BP 99/66   Pulse 71   Temp 98.5 F (36.9 C)   Ht 5\' 7"  (1.702 m)   Wt 220 lb 12.8 oz (100.2 kg)   BMI 34.58 kg/m   Constitutional:  Alert and oriented, No acute distress. HEENT: [place] AT, moist mucus membranes.  Trachea midline, no masses. Cardiovascular: No clubbing, cyanosis, or edema. Respiratory: Normal respiratory effort, no increased work of  breathing. GI: Abdomen is soft, nontender, nondistended, no abdominal masses GU: No CVA tenderness.  Lymph: No cervical or inguinal lymphadenopathy. Skin: No rashes, bruises or suspicious lesions. Neurologic: Grossly intact, no focal deficits, moving all 4 extremities. Psychiatric: Normal mood and affect.  Laboratory Data: Lab Results  Component Value Date   WBC 6.0 03/11/2020   HGB 15.1 03/11/2020   HCT 45.9 03/11/2020   MCV 100.2 (H) 03/11/2020   PLT 165 03/11/2020    Lab Results  Component Value Date   CREATININE 0.98 03/11/2020    No results found for: PSA  No results found for: TESTOSTERONE  No results found for: HGBA1C  Urinalysis    Component Value Date/Time   COLORURINE YELLOW 04/30/2019 2040   APPEARANCEUR Clear 05/06/2020 0925   LABSPEC 1.023 04/30/2019 2040   PHURINE 7.0 04/30/2019 2040   GLUCOSEU Negative 05/06/2020 0925   HGBUR SMALL (A) 04/30/2019 2040   BILIRUBINUR Negative 05/06/2020 0925   KETONESUR 5 (A) 04/30/2019 2040   PROTEINUR 2+ (A) 05/06/2020 0925   PROTEINUR NEGATIVE 04/30/2019 2040   UROBILINOGEN negative (A) 01/28/2020 1612   NITRITE Negative 05/06/2020 0925   NITRITE POSITIVE (A) 04/30/2019 2040   LEUKOCYTESUR Negative 05/06/2020 0925   LEUKOCYTESUR MODERATE (A) 04/30/2019 2040    Lab Results  Component Value Date    LABMICR See below: 05/06/2020   WBCUA None seen 05/06/2020   LABEPIT None seen 05/06/2020   MUCUS Present 05/06/2020   BACTERIA None seen 05/06/2020    Pertinent Imaging:  No results found for this or any previous visit.  No results found for this or any previous visit.  No results found for this or any previous visit.  No results found for this or any previous visit.  No results found for this or any previous visit.  No results found for this or any previous visit.  No results found for this or any previous visit.  No results found for this or any previous visit.   Assessment & Plan:    1. Benign prostatic hyperplasia with urinary obstruction -Restart uroxatral 10mg  - Urinalysis, Routine w reflex microscopic  2. Nocturia --restart uroxatral 10mg  qhs  3. OAB -start sanctura 20mg  BID   No follow-ups on file.  Nicolette Bang, MD  [facility]

## 2020-06-02 NOTE — Patient Instructions (Signed)

## 2020-06-02 NOTE — Progress Notes (Signed)
Urological Symptom Review  Patient is experiencing the following symptoms: Get up at night to urinate Trouble starting stream Weak stream Erection problems (male only)   Review of Systems  Gastrointestinal (upper)  : Negative for upper GI symptoms  Gastrointestinal (lower) : Negative for lower GI symptoms  Constitutional : Negative for symptoms  Skin: Negative for skin symptoms  Eyes: Negative for eye symptoms  Ear/Nose/Throat : Negative for Ear/Nose/Throat symptoms  Hematologic/Lymphatic: Easy bruising  Cardiovascular : Negative for cardiovascular symptoms  Respiratory : Negative for respiratory symptoms  Endocrine: Negative for endocrine symptoms  Musculoskeletal: Joint pain  Neurological: Parkinson's   Psychologic: Negative for psychiatric symptoms

## 2020-06-02 NOTE — Telephone Encounter (Signed)
   Primary Cardiologist: Rozann Lesches, MD  Chart reviewed as part of pre-operative protocol coverage.   Simple dental extractions are considered low risk procedures per guidelines and generally do not require any specific cardiac clearance. It is also generally accepted that for simple extractions and dental cleanings, there is no need to interrupt blood thinner therapy.  SBE prophylaxis is not required for the patient from a cardiac standpoint.  I will route this recommendation to the requesting party via Epic fax function and remove from pre-op pool.  Please call with questions.  [place], [place] 06/02/2020, 11:55 AM

## 2020-06-03 DIAGNOSIS — Z23 Encounter for immunization: Secondary | ICD-10-CM | POA: Diagnosis not present

## 2020-06-25 DIAGNOSIS — S1093XA Contusion of unspecified part of neck, initial encounter: Secondary | ICD-10-CM | POA: Diagnosis not present

## 2020-06-25 DIAGNOSIS — S0101XA Laceration without foreign body of scalp, initial encounter: Secondary | ICD-10-CM | POA: Diagnosis not present

## 2020-06-25 DIAGNOSIS — I4891 Unspecified atrial fibrillation: Secondary | ICD-10-CM | POA: Diagnosis not present

## 2020-06-25 DIAGNOSIS — R9431 Abnormal electrocardiogram [ECG] [EKG]: Secondary | ICD-10-CM | POA: Diagnosis not present

## 2020-06-25 DIAGNOSIS — S0990XA Unspecified injury of head, initial encounter: Secondary | ICD-10-CM | POA: Diagnosis not present

## 2020-06-25 DIAGNOSIS — S199XXA Unspecified injury of neck, initial encounter: Secondary | ICD-10-CM | POA: Diagnosis not present

## 2020-06-25 DIAGNOSIS — G20 Parkinson's disease: Secondary | ICD-10-CM | POA: Diagnosis not present

## 2020-06-29 ENCOUNTER — Telehealth: Payer: Self-pay

## 2020-06-29 NOTE — Telephone Encounter (Signed)
Wife called stating that husband was having a drop in bp in the morning since taking uroxatral and sanctura. Per Dr. Alyson Ingles pt is to take the uroxatral every other night instead of nightly. Wife voiced understanding.

## 2020-07-01 ENCOUNTER — Telehealth: Payer: Self-pay | Admitting: Cardiology

## 2020-07-01 NOTE — Telephone Encounter (Signed)
If he is having trouble tolerating alfuzosin due to hypotension, would suggest that they review with urologist whether there might be an alternate medication to consider.

## 2020-07-01 NOTE — Telephone Encounter (Signed)
Santiago Glad (spouse) called in regards to patient's BP continues to stay low.

## 2020-07-01 NOTE — Telephone Encounter (Signed)
Wife reports starting alfuzosin around the the first of November by urology and noticed BP getting low again. Wife reports prior to starting alfuzosin, BP was normal. Says she discussed with urologist-and suggested patient take alfuzosin every other day. Wife reported recent BP's-see below. Advised that BP's are similar to readings from September and October and were previously reported to [facility]. Reports BP cuff is accurate. Reports patient is staying well hydrated. Wife says patient is not using compression stockings. Encouraged to go to [facility] or [facility] to purchase compression stockings. Advised that low blood pressure and dizziness are side effects of alpha blockers and that she should check with urologist to see if there are any other alternatives.  12/02 95/66 HR 79 12/01 106/73  11/30 86/59

## 2020-07-07 DIAGNOSIS — Z299 Encounter for prophylactic measures, unspecified: Secondary | ICD-10-CM | POA: Diagnosis not present

## 2020-07-07 DIAGNOSIS — I10 Essential (primary) hypertension: Secondary | ICD-10-CM | POA: Diagnosis not present

## 2020-07-07 DIAGNOSIS — G20 Parkinson's disease: Secondary | ICD-10-CM | POA: Diagnosis not present

## 2020-07-07 DIAGNOSIS — I4891 Unspecified atrial fibrillation: Secondary | ICD-10-CM | POA: Diagnosis not present

## 2020-07-07 DIAGNOSIS — R42 Dizziness and giddiness: Secondary | ICD-10-CM | POA: Diagnosis not present

## 2020-07-16 ENCOUNTER — Encounter: Payer: Self-pay | Admitting: Urology

## 2020-07-16 ENCOUNTER — Ambulatory Visit (INDEPENDENT_AMBULATORY_CARE_PROVIDER_SITE_OTHER): Payer: MEDICARE | Admitting: Urology

## 2020-07-16 ENCOUNTER — Other Ambulatory Visit: Payer: Self-pay

## 2020-07-16 VITALS — BP 108/69 | HR 71 | Temp 98.7°F | Ht 67.0 in | Wt 220.8 lb

## 2020-07-16 DIAGNOSIS — I25119 Atherosclerotic heart disease of native coronary artery with unspecified angina pectoris: Secondary | ICD-10-CM | POA: Diagnosis not present

## 2020-07-16 DIAGNOSIS — R32 Unspecified urinary incontinence: Secondary | ICD-10-CM

## 2020-07-16 DIAGNOSIS — N138 Other obstructive and reflux uropathy: Secondary | ICD-10-CM

## 2020-07-16 DIAGNOSIS — N401 Enlarged prostate with lower urinary tract symptoms: Secondary | ICD-10-CM

## 2020-07-16 DIAGNOSIS — R3912 Poor urinary stream: Secondary | ICD-10-CM | POA: Diagnosis not present

## 2020-07-16 LAB — URINALYSIS, ROUTINE W REFLEX MICROSCOPIC
Bilirubin, UA: NEGATIVE
Glucose, UA: NEGATIVE
Ketones, UA: NEGATIVE
Leukocytes,UA: NEGATIVE
Nitrite, UA: NEGATIVE
Protein,UA: NEGATIVE
RBC, UA: NEGATIVE
Specific Gravity, UA: 1.025 (ref 1.005–1.030)
Urobilinogen, Ur: 0.2 mg/dL (ref 0.2–1.0)
pH, UA: 5.5 (ref 5.0–7.5)

## 2020-07-16 MED ORDER — TROSPIUM CHLORIDE 20 MG PO TABS
20.0000 mg | ORAL_TABLET | Freq: Two times a day (BID) | ORAL | 11 refills | Status: DC
Start: 2020-07-16 — End: 2021-04-26

## 2020-07-16 NOTE — Progress Notes (Signed)
07/16/2020 11:32 AM   [name] [birth year] [id number]  Referring provider: Glenda Chroman, MD [place],  [place] [address]  followup urinary incontinence  HPI: Mr Broxterman is a 74yo here for followup for BPH, urinary frequency and urinary incontinence. Last visit he was started on uroxatral and he became dizzy and fell. He cannot tolerate alpha blockers. His parkingsons has progressed since last visit. He is on sanctrua 20mg  BID which has improved his incontinent episodes. Rare noctural enuresis. He has urinary frequency every 30-60 minutes.    PMH: Past Medical History:  Diagnosis Date  . Agent orange exposure   . Anxiety   . Arthritis   . Atrial fibrillation ([place])   . Chronic lower back pain   . Coronary atherosclerosis of native coronary artery    70-80% small diagonal - medically managed, LVEF 55%  . Depression   . Dermatophytosis of nail   . ED (erectile dysfunction)   . Essential hypertension   . GERD (gastroesophageal reflux disease)   . History of colonic polyps 10/08/2017  . History of pneumonia   . Lumbago   . Mixed hyperlipidemia   . Non-ST elevation myocardial infarction (NSTEMI) ([place]) 05/2010  . Obesity   . Parkinson disease ([place])   . Paroxysmal atrial fibrillation ([facility])   . Prostatitis   . Rotator cuff tear, left   . Sleep apnea    does not use CPAP    Surgical History: Past Surgical History:  Procedure Laterality Date  . ANTERIOR CERVICAL DECOMP/DISCECTOMY FUSION    . APPENDECTOMY    . BACK SURGERY    . CARDIAC CATHETERIZATION  X 3  . CATARACT EXTRACTION W/ INTRAOCULAR LENS  IMPLANT, BILATERAL Bilateral   . CHOLECYSTECTOMY OPEN  05/2014   rt upper quadrant pain   . COLONOSCOPY N/A 11/29/2017   Procedure: COLONOSCOPY;  Surgeon: Rogene Houston, MD;  [facility];  Service: Endoscopy;  Laterality: N/A;  8:30  . ESOPHAGOGASTRODUODENOSCOPY N/A 12/02/2014   Procedure: ESOPHAGOGASTRODUODENOSCOPY (EGD);  Surgeon: Rogene Houston, MD;   [facility];  Service: Endoscopy;  Laterality: N/A;  200  . FRACTURE SURGERY    . KNEE ARTHROSCOPY Right   . LEFT HEART CATH AND CORONARY ANGIOGRAPHY N/A 02/12/2018   Procedure: LEFT HEART CATH AND CORONARY ANGIOGRAPHY;  Surgeon: Troy Sine, MD;  Location: [facility];  Service: Cardiovascular;  Laterality: N/A;  . LUMBAR [place]    . POLYPECTOMY  11/29/2017   Procedure: POLYPECTOMY;  Surgeon: Rogene Houston, MD;  [facility];  Service: Endoscopy;;  transverse  . SHOULDER ARTHROSCOPY WITH ROTATOR CUFF REPAIR AND OPEN BICEPS TENODESIS Left 12/30/2018   Procedure: LEFT SHOULDER ARTHROSCOPY WITH ROTATOR CUFF REPAIR;  Surgeon: Netta Cedars, MD;  Location: [facility];  Service: Orthopedics;  Laterality: Left;  . TONSILLECTOMY  1958  . WRIST FRACTURE SURGERY Left 1994    Home Medications:  Allergies as of 07/16/2020      Reactions   Iodinated Diagnostic Agents Swelling   Prednisone    Sulfa Drugs Cross Reactors Itching      Medication List       Accurate as of July 16, 2020 11:32 AM. If you have any questions, ask your nurse or doctor.        STOP taking these medications   alfuzosin 10 MG 24 hr tablet Commonly known as: UROXATRAL Stopped by: Nicolette Bang, MD   amoxicillin 500 MG capsule  Commonly known as: AMOXIL Stopped by: Nicolette Bang, MD   mirabegron ER 25 MG Tb24 tablet Commonly known as: MYRBETRIQ Stopped by: Nicolette Bang, MD   Tylenol 325 MG tablet Generic drug: acetaminophen Stopped by: Nicolette Bang, MD     TAKE these medications   apixaban 5 MG Tabs tablet Commonly known as: Eliquis Take 1 tablet (5 mg total) by mouth 2 (two) times daily.   buPROPion 100 MG tablet Commonly known as: WELLBUTRIN Take 100 mg by mouth 2 (two) times daily.   carbidopa-levodopa 25-250 MG tablet Commonly known as: SINEMET IR Take 1 tablet by mouth 3 (three) times daily.   carbidopa-levodopa 50-200 MG  tablet Commonly known as: SINEMET CR Take 1 tablet by mouth at bedtime.   carboxymethylcellulose 0.5 % Soln Commonly known as: REFRESH PLUS Place 1 drop into both eyes 3 (three) times daily as needed (for dry eyes).   docusate sodium 100 MG capsule Commonly known as: COLACE Take 100 mg by mouth daily.   donepezil 10 MG tablet Commonly known as: ARICEPT Take 10 mg by mouth at bedtime.   ibuprofen 800 MG tablet Commonly known as: ADVIL Take 800 mg by mouth 3 (three) times daily.   melatonin 3 MG Tabs tablet Take 6 mg by mouth at bedtime.   memantine 10 MG tablet Commonly known as: NAMENDA Take 10 mg by mouth 2 (two) times daily.   nitroGLYCERIN 0.4 MG SL tablet Commonly known as: NITROSTAT Place 1 tablet (0.4 mg total) under the tongue every 5 (five) minutes as needed for chest pain.   omeprazole 20 MG capsule Commonly known as: PRILOSEC Take 20 mg by mouth 2 (two) times daily before a meal.   polyethylene glycol powder 17 GM/SCOOP powder Commonly known as: GLYCOLAX/MIRALAX Take 8.5 g by mouth daily as needed. What changed:   how much to take  reasons to take this   rosuvastatin 5 MG tablet Commonly known as: CRESTOR Take 2.5 mg by mouth daily.   trospium 20 MG tablet Commonly known as: SANCTURA Take 1 tablet (20 mg total) by mouth 2 (two) times daily.   Vitamin D3 25 MCG (1000 UT) Caps Take 1,000 Units by mouth daily.       Allergies:  Allergies  Allergen Reactions  . Iodinated Diagnostic Agents Swelling  . Prednisone   . Sulfa Drugs Cross Reactors Itching    Family History: Family History  Problem Relation Age of Onset  . Lymphoma Sister     Social History:  reports that he quit smoking about 18 years ago. His smoking use included cigarettes. He has a 40.00 pack-year smoking history. He has never used smokeless tobacco. He reports previous alcohol use. He reports that he does not use drugs.  ROS: All other review of systems were reviewed and  are negative except what is noted above in HPI  Physical Exam: BP 108/69   Pulse 71   Temp 98.7 F (37.1 C)   Ht 5\' 7"  (1.702 m)   Wt 220 lb 12.8 oz (100.2 kg)   BMI 34.58 kg/m   Constitutional:  Alert and oriented, No acute distress. HEENT: [place] AT, moist mucus membranes.  Trachea midline, no masses. Cardiovascular: No clubbing, cyanosis, or edema. Respiratory: Normal respiratory effort, no increased work of breathing. GI: Abdomen is soft, nontender, nondistended, no abdominal masses GU: No CVA tenderness.  Lymph: No cervical or inguinal lymphadenopathy. Skin: No rashes, bruises or suspicious lesions. Neurologic: Grossly intact, no focal deficits, moving all 4 extremities.  Psychiatric: Normal mood and affect.  Laboratory Data: Lab Results  Component Value Date   WBC 6.0 03/11/2020   HGB 15.1 03/11/2020   HCT 45.9 03/11/2020   MCV 100.2 (H) 03/11/2020   PLT 165 03/11/2020    Lab Results  Component Value Date   CREATININE 0.98 03/11/2020    No results found for: PSA  No results found for: TESTOSTERONE  No results found for: HGBA1C  Urinalysis    Component Value Date/Time   COLORURINE YELLOW 04/30/2019 2040   APPEARANCEUR Clear 07/16/2020 1102   LABSPEC 1.023 04/30/2019 2040   PHURINE 7.0 04/30/2019 2040   GLUCOSEU Negative 07/16/2020 1102   HGBUR SMALL (A) 04/30/2019 2040   BILIRUBINUR Negative 07/16/2020 1102   KETONESUR 5 (A) 04/30/2019 2040   PROTEINUR Negative 07/16/2020 [place] 04/30/2019 2040   UROBILINOGEN negative (A) 01/28/2020 1612   NITRITE Negative 07/16/2020 1102   NITRITE POSITIVE (A) 04/30/2019 2040   LEUKOCYTESUR Negative 07/16/2020 1102   LEUKOCYTESUR MODERATE (A) 04/30/2019 2040    Lab Results  Component Value Date   LABMICR Comment 07/16/2020   WBCUA None seen 05/06/2020   LABEPIT None seen 05/06/2020   MUCUS Present 05/06/2020   BACTERIA None seen 05/06/2020    Pertinent Imaging:  No results found for this or  any previous visit.  No results found for this or any previous visit.  No results found for this or any previous visit.  No results found for this or any previous visit.  No results found for this or any previous visit.  No results found for this or any previous visit.  No results found for this or any previous visit.  No results found for this or any previous visit.   Assessment & Plan:    1. Benign prostatic hyperplasia with urinary obstruction -continue observation since patient cannot tolerate alpha blockers - Urinalysis, Routine w reflex microscopic  2. Weak urinary stream -timed voiding and observation since patient cannot tolerate alpha blockers 3. OAB -continue sanctura 20mg  BID   No follow-ups on file.  Nicolette Bang, MD  [facility] [place]

## 2020-07-16 NOTE — Patient Instructions (Signed)

## 2020-07-16 NOTE — Progress Notes (Signed)
Urological Symptom Review  Patient is experiencing the following symptoms: Hard to postpone urination Get up at night to urinate Leakage of urine Trouble starting stream Weak stream Erection problems (male only)   Review of Systems  Gastrointestinal (upper)  : Negative for upper GI symptoms  Gastrointestinal (lower) : Constipation  Constitutional : Fatigue  Skin: Itching  Eyes: Negative for eye symptoms  Ear/Nose/Throat : Negative for Ear/Nose/Throat symptoms  Hematologic/Lymphatic: Easy bruising  Cardiovascular : Negative for cardiovascular symptoms  Respiratory : Negative for respiratory symptoms  Endocrine: Negative for endocrine symptoms  Musculoskeletal: Joint pain  Neurological: Dizziness  Psychologic: Depression

## 2020-07-30 DIAGNOSIS — I4891 Unspecified atrial fibrillation: Secondary | ICD-10-CM | POA: Diagnosis not present

## 2020-07-30 DIAGNOSIS — E7849 Other hyperlipidemia: Secondary | ICD-10-CM | POA: Diagnosis not present

## 2020-07-30 DIAGNOSIS — I10 Essential (primary) hypertension: Secondary | ICD-10-CM | POA: Diagnosis not present

## 2020-08-25 DIAGNOSIS — Z6834 Body mass index (BMI) 34.0-34.9, adult: Secondary | ICD-10-CM | POA: Diagnosis not present

## 2020-08-25 DIAGNOSIS — Z86010 Personal history of colonic polyps: Secondary | ICD-10-CM | POA: Diagnosis not present

## 2020-08-30 DIAGNOSIS — I4891 Unspecified atrial fibrillation: Secondary | ICD-10-CM | POA: Diagnosis not present

## 2020-08-30 DIAGNOSIS — I10 Essential (primary) hypertension: Secondary | ICD-10-CM | POA: Diagnosis not present

## 2020-08-30 DIAGNOSIS — E7849 Other hyperlipidemia: Secondary | ICD-10-CM | POA: Diagnosis not present

## 2020-09-10 ENCOUNTER — Ambulatory Visit: Payer: MEDICARE | Admitting: Urology

## 2020-09-13 ENCOUNTER — Other Ambulatory Visit: Payer: Self-pay

## 2020-09-13 ENCOUNTER — Encounter: Payer: Self-pay | Admitting: Urology

## 2020-09-13 ENCOUNTER — Ambulatory Visit (INDEPENDENT_AMBULATORY_CARE_PROVIDER_SITE_OTHER): Payer: MEDICARE | Admitting: Urology

## 2020-09-13 VITALS — BP 119/76 | HR 65 | Temp 98.2°F | Ht 67.0 in | Wt 218.0 lb

## 2020-09-13 DIAGNOSIS — N138 Other obstructive and reflux uropathy: Secondary | ICD-10-CM | POA: Diagnosis not present

## 2020-09-13 DIAGNOSIS — R3912 Poor urinary stream: Secondary | ICD-10-CM

## 2020-09-13 DIAGNOSIS — N401 Enlarged prostate with lower urinary tract symptoms: Secondary | ICD-10-CM

## 2020-09-13 DIAGNOSIS — N3281 Overactive bladder: Secondary | ICD-10-CM | POA: Diagnosis not present

## 2020-09-13 LAB — BLADDER SCAN AMB NON-IMAGING: Scan Result: 48

## 2020-09-13 NOTE — Patient Instructions (Signed)
Overactive Bladder, Adult  Overactive bladder is a condition in which a person has a sudden and frequent need to urinate. A person might also leak urine if he or she cannot get to the bathroom fast enough (urinary incontinence). Sometimes, symptoms can interfere with work or social activities. What are the causes? Overactive bladder is associated with poor nerve signals between your bladder and your brain. Your bladder may get the signal to empty before it is full. You may also have very sensitive muscles that make your bladder squeeze too soon. This condition may also be caused by other factors, such as:  Medical conditions: ? Urinary tract infection. ? Infection of nearby tissues. ? Prostate enlargement. ? Bladder stones, inflammation, or tumors. ? Diabetes. ? Muscle or nerve weakness, especially from these conditions:  A spinal cord injury.  Stroke.  Multiple sclerosis.  Parkinson's disease.  Other causes: ? Surgery on the uterus or urethra. ? Drinking too much caffeine or alcohol. ? Certain medicines, especially those that eliminate extra fluid in the body (diuretics). ? Constipation. What increases the risk? You may be at greater risk for overactive bladder if you:  Are an older adult.  Smoke.  Are going through menopause.  Have prostate problems.  Have a neurological disease, such as stroke, dementia, Parkinson's disease, or multiple sclerosis (MS).  Eat or drink alcohol, spicy food, caffeine, and other things that irritate the bladder.  Are overweight or obese. What are the signs or symptoms? Symptoms of this condition include a sudden, strong urge to urinate. Other symptoms include:  Leaking urine.  Urinating 8 or more times a day.  Waking up to urinate 2 or more times overnight. How is this diagnosed? This condition may be diagnosed based on:  Your symptoms and medical history.  A physical exam.  Blood or urine tests to check for possible causes,  such as infection. You may also need to see a health care provider who specializes in urinary tract problems. This is called a urologist. How is this treated? Treatment for overactive bladder depends on the cause of your condition and whether it is mild or severe. Treatment may include:  Bladder training, such as: ? Learning to control the urge to urinate by following a schedule to urinate at regular intervals. ? Doing Kegel exercises to strengthen the pelvic floor muscles that support your bladder.  Special devices, such as: ? Biofeedback. This uses sensors to help you become aware of your body's signals. ? Electrical stimulation. This uses electrodes placed inside the body (implanted) or outside the body. These electrodes send gentle pulses of electricity to strengthen the nerves or muscles that control the bladder. ? Women may use a plastic device, called a pessary, that fits into the vagina and supports the bladder.  Medicines, such as: ? Antibiotics to treat bladder infection. ? Antispasmodics to stop the bladder from releasing urine at the wrong time. ? Tricyclic antidepressants to relax bladder muscles. ? Injections of botulinum toxin type A directly into the bladder tissue to relax bladder muscles.  Surgery, such as: ? A device may be implanted to help manage the nerve signals that control urination. ? An electrode may be implanted to stimulate electrical signals in the bladder. ? A procedure may be done to change the shape of the bladder. This is done only in very severe cases. Follow these instructions at home: Eating and drinking  Make diet or lifestyle changes recommended by your health care provider. These may include: ? Drinking fluids   throughout the day and not only with meals. ? Cutting down on caffeine or alcohol. ? Eating a healthy and balanced diet to prevent constipation. This may include:  Choosing foods that are high in fiber, such as beans, whole grains, and  fresh fruits and vegetables.  Limiting foods that are high in fat and processed sugars, such as fried and sweet foods.   Lifestyle  Lose weight if needed.  Do not use any products that contain nicotine or tobacco. These include cigarettes, chewing tobacco, and vaping devices, such as e-cigarettes. If you need help quitting, ask your health care provider.   General instructions  Take over-the-counter and prescription medicines only as told by your health care provider.  If you were prescribed an antibiotic medicine, take it as told by your health care provider. Do not stop taking the antibiotic even if you start to feel better.  Use any implants or pessary as told by your health care provider.  If needed, wear pads to absorb urine leakage.  Keep a log to track how much and when you drink, and when you need to urinate. This will help your health care provider monitor your condition.  Keep all follow-up visits. This is important. Contact a health care provider if:  You have a fever or chills.  Your symptoms do not get better with treatment.  Your pain and discomfort get worse.  You have more frequent urges to urinate. Get help right away if:  You are not able to control your bladder. Summary  Overactive bladder refers to a condition in which a person has a sudden and frequent need to urinate.  Several conditions may lead to an overactive bladder.  Treatment for overactive bladder depends on the cause and severity of your condition.  Making lifestyle changes, doing Kegel exercises, keeping a log, and taking medicines can help with this condition. This information is not intended to replace advice given to you by your health care provider. Make sure you discuss any questions you have with your health care provider. Document Revised: 04/05/2020 Document Reviewed: 04/05/2020 Elsevier Patient Education  2021 Elsevier Inc.  

## 2020-09-13 NOTE — Progress Notes (Signed)
Bladder Scan Patient cannot void: 48 ml Performed By: Durenda Guthrie, lpn   Urological Symptom Review  Patient is experiencing the following symptoms: Hard to postpone urination Get up at night to urinate Stream starts and stops Trouble starting stream Have to strain to urinate Weak stream Erection problems (male only)  Parkinson's Disease   Review of Systems  Gastrointestinal (upper)  : Negative for upper GI symptoms  Gastrointestinal (lower) : Negative for lower GI symptoms  Constitutional : Negative for symptoms  Skin: Negative for skin symptoms  Eyes: Negative for eye symptoms  Ear/Nose/Throat : Negative for Ear/Nose/Throat symptoms  Hematologic/Lymphatic: Negative for Hematologic/Lymphatic symptoms  Cardiovascular : Negative for cardiovascular symptoms  Respiratory : Negative for respiratory symptoms  Endocrine: Negative for endocrine symptoms  Musculoskeletal: Joint pain  Neurological: Negative for neurological symptoms  Psychologic: Negative for psychiatric symptoms

## 2020-09-13 NOTE — Progress Notes (Signed)
09/13/2020 10:09 AM   David Combs [birth year] [id number]  Referring provider: Glenda Chroman, MD [place],  [place] [address]  Followup BPH and OAB  HPI: David Combs is a 75yo here for followup for BPH with Nocturia and OAB. He is on tropsium 20mg  BID. He urinates twice a day and twice at night. He has severe urgency when he has to urinate and the stream is weak. He has nocturia 2x. He has not tried timed voiding. NO hesitancy, starting/stopping. He has rare urge incontinence.    PMH: Past Medical History:  Diagnosis Date  . Agent orange exposure   . Anxiety   . Arthritis   . Atrial fibrillation ([place])   . Chronic lower back pain   . Coronary atherosclerosis of native coronary artery    70-80% small diagonal - medically managed, LVEF 55%  . Depression   . Dermatophytosis of nail   . ED (erectile dysfunction)   . Essential hypertension   . GERD (gastroesophageal reflux disease)   . History of colonic polyps 10/08/2017  . History of pneumonia   . Lumbago   . Mixed hyperlipidemia   . Non-ST elevation myocardial infarction (NSTEMI) ([place]) 05/2010  . Obesity   . Parkinson disease ([place])   . Paroxysmal atrial fibrillation ([facility])   . Prostatitis   . Rotator cuff tear, left   . Sleep apnea    does not use CPAP    Surgical History: Past Surgical History:  Procedure Laterality Date  . ANTERIOR CERVICAL DECOMP/DISCECTOMY FUSION    . APPENDECTOMY    . BACK SURGERY    . CARDIAC CATHETERIZATION  X 3  . CATARACT EXTRACTION W/ INTRAOCULAR LENS  IMPLANT, BILATERAL Bilateral   . CHOLECYSTECTOMY OPEN  05/2014   rt upper quadrant pain   . COLONOSCOPY N/A 11/29/2017   Procedure: COLONOSCOPY;  Surgeon: Rogene Houston, MD;  [facility];  Service: Endoscopy;  Laterality: N/A;  8:30  . ESOPHAGOGASTRODUODENOSCOPY N/A 12/02/2014   Procedure: ESOPHAGOGASTRODUODENOSCOPY (EGD);  Surgeon: Rogene Houston, MD;  [facility];  Service: Endoscopy;  Laterality: N/A;  200   . FRACTURE SURGERY    . KNEE ARTHROSCOPY Right   . LEFT HEART CATH AND CORONARY ANGIOGRAPHY N/A 02/12/2018   Procedure: LEFT HEART CATH AND CORONARY ANGIOGRAPHY;  Surgeon: Troy Sine, MD;  Location: [facility];  Service: Cardiovascular;  Laterality: N/A;  . LUMBAR [place]    . POLYPECTOMY  11/29/2017   Procedure: POLYPECTOMY;  Surgeon: Rogene Houston, MD;  [facility];  Service: Endoscopy;;  transverse  . SHOULDER ARTHROSCOPY WITH ROTATOR CUFF REPAIR AND OPEN BICEPS TENODESIS Left 12/30/2018   Procedure: LEFT SHOULDER ARTHROSCOPY WITH ROTATOR CUFF REPAIR;  Surgeon: Netta Cedars, MD;  Location: [facility];  Service: Orthopedics;  Laterality: Left;  . TONSILLECTOMY  1958  . WRIST FRACTURE SURGERY Left 1994    Home Medications:  Allergies as of 09/13/2020      Reactions   Iodinated Diagnostic Agents Swelling   Prednisone    Sulfa Drugs Cross Reactors Itching      Medication List       Accurate as of September 13, 2020 10:09 AM. If you have any questions, ask your nurse or doctor.        STOP taking these medications   donepezil 10 MG tablet Commonly known as: ARICEPT Stopped by: Nicolette Bang, MD   ibuprofen 800 MG tablet Commonly known  as: ADVIL Stopped by: Nicolette Bang, MD     TAKE these medications   apixaban 5 MG Tabs tablet Commonly known as: Eliquis Take 1 tablet (5 mg total) by mouth 2 (two) times daily.   buPROPion 100 MG tablet Commonly known as: WELLBUTRIN Take 100 mg by mouth 2 (two) times daily.   carbidopa-levodopa 25-250 MG tablet Commonly known as: SINEMET IR Take 1 tablet by mouth 3 (three) times daily.   carbidopa-levodopa 50-200 MG tablet Commonly known as: SINEMET CR Take 1 tablet by mouth at bedtime.   carboxymethylcellulose 0.5 % Soln Commonly known as: REFRESH PLUS Place 1 drop into both eyes 3 (three) times daily as needed (for dry eyes).   docusate sodium 100 MG capsule Commonly known  as: COLACE Take 100 mg by mouth daily.   melatonin 3 MG Tabs tablet Take 6 mg by mouth at bedtime.   memantine 10 MG tablet Commonly known as: NAMENDA Take 10 mg by mouth 2 (two) times daily.   nitroGLYCERIN 0.4 MG SL tablet Commonly known as: NITROSTAT Place 1 tablet (0.4 mg total) under the tongue every 5 (five) minutes as needed for chest pain.   omeprazole 20 MG capsule Commonly known as: PRILOSEC Take 20 mg by mouth 2 (two) times daily before a meal.   polyethylene glycol powder 17 GM/SCOOP powder Commonly known as: GLYCOLAX/MIRALAX Take 8.5 g by mouth daily as needed. What changed:   how much to take  reasons to take this   rosuvastatin 5 MG tablet Commonly known as: CRESTOR Take 2.5 mg by mouth daily.   trospium 20 MG tablet Commonly known as: SANCTURA Take 1 tablet (20 mg total) by mouth 2 (two) times daily.   Vitamin D3 25 MCG (1000 UT) Caps Take 1,000 Units by mouth daily.       Allergies:  Allergies  Allergen Reactions  . Iodinated Diagnostic Agents Swelling  . Prednisone   . Sulfa Drugs Cross Reactors Itching    Family History: Family History  Problem Relation Age of Onset  . Lymphoma Sister     Social History:  reports that he quit smoking about 18 years ago. His smoking use included cigarettes. He has a 40.00 pack-year smoking history. He has never used smokeless tobacco. He reports previous alcohol use. He reports that he does not use drugs.  ROS: All other review of systems were reviewed and are negative except what is noted above in HPI  Physical Exam: BP 119/76   Pulse 65   Temp 98.2 F (36.8 C)   Ht 5\' 7"  (1.702 m)   Wt 218 lb (98.9 kg)   BMI 34.14 kg/m   Constitutional:  Alert and oriented, No acute distress. HEENT: [place] AT, moist mucus membranes.  Trachea midline, no masses. Cardiovascular: No clubbing, cyanosis, or edema. Respiratory: Normal respiratory effort, no increased work of breathing. GI: Abdomen is soft, nontender,  nondistended, no abdominal masses GU: No CVA tenderness.  Lymph: No cervical or inguinal lymphadenopathy. Skin: No rashes, bruises or suspicious lesions. Neurologic: Grossly intact, no focal deficits, moving all 4 extremities. Psychiatric: Normal mood and affect.  Laboratory Data: Lab Results  Component Value Date   WBC 6.0 03/11/2020   HGB 15.1 03/11/2020   HCT 45.9 03/11/2020   MCV 100.2 (H) 03/11/2020   PLT 165 03/11/2020    Lab Results  Component Value Date   CREATININE 0.98 03/11/2020    No results found for: PSA  No results found for: TESTOSTERONE  No results  found for: HGBA1C  Urinalysis    Component Value Date/Time   COLORURINE YELLOW 04/30/2019 2040   APPEARANCEUR Clear 07/16/2020 1102   LABSPEC 1.023 04/30/2019 2040   PHURINE 7.0 04/30/2019 2040   GLUCOSEU Negative 07/16/2020 1102   HGBUR SMALL (A) 04/30/2019 2040   BILIRUBINUR Negative 07/16/2020 1102   KETONESUR 5 (A) 04/30/2019 2040   PROTEINUR Negative 07/16/2020 1102   PROTEINUR NEGATIVE 04/30/2019 2040   UROBILINOGEN negative (A) 01/28/2020 1612   NITRITE Negative 07/16/2020 1102   NITRITE POSITIVE (A) 04/30/2019 2040   LEUKOCYTESUR Negative 07/16/2020 1102   LEUKOCYTESUR MODERATE (A) 04/30/2019 2040    Lab Results  Component Value Date   LABMICR Comment 07/16/2020   WBCUA None seen 05/06/2020   LABEPIT None seen 05/06/2020   MUCUS Present 05/06/2020   BACTERIA None seen 05/06/2020    Pertinent Imaging:  No results found for this or any previous visit.  No results found for this or any previous visit.  No results found for this or any previous visit.  No results found for this or any previous visit.  No results found for this or any previous visit.  No results found for this or any previous visit.  No results found for this or any previous visit.  No results found for this or any previous visit.   Assessment & Plan:    1. Benign prostatic hyperplasia with urinary  obstruction -observation - Urinalysis, Routine w reflex microscopic - BLADDER SCAN AMB NON-IMAGING  2. OAB (overactive bladder) -sanctura 20mg  BID  3. Weak urinary stream -observation   No follow-ups on file.  Nicolette Bang, MD  [facility] [place]

## 2020-09-15 ENCOUNTER — Ambulatory Visit: Payer: MEDICARE | Admitting: Urology

## 2020-09-24 ENCOUNTER — Ambulatory Visit: Payer: MEDICARE | Admitting: Urology

## 2020-09-27 DIAGNOSIS — Z01818 Encounter for other preprocedural examination: Secondary | ICD-10-CM | POA: Diagnosis not present

## 2020-09-28 ENCOUNTER — Encounter: Payer: Self-pay | Admitting: *Deleted

## 2020-09-29 ENCOUNTER — Encounter: Payer: Self-pay | Admitting: Cardiology

## 2020-09-29 ENCOUNTER — Ambulatory Visit (INDEPENDENT_AMBULATORY_CARE_PROVIDER_SITE_OTHER): Payer: MEDICARE | Admitting: Cardiology

## 2020-09-29 VITALS — BP 110/82 | HR 65 | Ht 67.0 in | Wt 220.0 lb

## 2020-09-29 DIAGNOSIS — I25119 Atherosclerotic heart disease of native coronary artery with unspecified angina pectoris: Secondary | ICD-10-CM | POA: Diagnosis not present

## 2020-09-29 DIAGNOSIS — I480 Paroxysmal atrial fibrillation: Secondary | ICD-10-CM | POA: Diagnosis not present

## 2020-09-29 NOTE — Patient Instructions (Addendum)

## 2020-09-29 NOTE — Progress Notes (Signed)
Cardiology Office Note  Date: 09/29/2020   ID: David Combs, David Combs Jun 16, 1946, MRN [id number]  PCP:  Glenda Chroman, MD  Cardiologist:  Rozann Lesches, MD Electrophysiologist:  None   Chief Complaint  Patient presents with  . Cardiac follow-up    History of Present Illness: David Combs is a 75 y.o. male last seen in August 2021.  He is here today with his wife for a follow-up visit.  Reports no progressive angina symptoms on current regimen.  He has limitations related to Parkinson's disease, some degree of gait instability and weakness as before.  I reviewed his medications which are outlined below.  He continues on Eliquis for stroke prophylaxis, no spontaneous bleeding problems.  Also Crestor and as needed nitroglycerin.  Past Medical History:  Diagnosis Date  . Agent orange exposure   . Anxiety   . Arthritis   . Atrial fibrillation ([place])   . Chronic lower back pain   . Coronary atherosclerosis of native coronary artery    70-80% small diagonal - medically managed, LVEF 55%  . Depression   . Dermatophytosis of nail   . ED (erectile dysfunction)   . Essential hypertension   . GERD (gastroesophageal reflux disease)   . History of colonic polyps 10/08/2017  . History of pneumonia   . Lumbago   . Mixed hyperlipidemia   . Non-ST elevation myocardial infarction (NSTEMI) ([place]) 05/2010  . Obesity   . Parkinson disease ([place])   . Paroxysmal atrial fibrillation ([facility])   . Prostatitis   . Rotator cuff tear, left   . Sleep apnea    does not use CPAP    Past Surgical History:  Procedure Laterality Date  . ANTERIOR CERVICAL DECOMP/DISCECTOMY FUSION    . APPENDECTOMY    . BACK SURGERY    . CARDIAC CATHETERIZATION  X 3  . CATARACT EXTRACTION W/ INTRAOCULAR LENS  IMPLANT, BILATERAL Bilateral   . CHOLECYSTECTOMY OPEN  05/2014   rt upper quadrant pain   . COLONOSCOPY N/A 11/29/2017   Procedure: COLONOSCOPY;  Surgeon: Rogene Houston, MD;  [facility];   Service: Endoscopy;  Laterality: N/A;  8:30  . ESOPHAGOGASTRODUODENOSCOPY N/A 12/02/2014   Procedure: ESOPHAGOGASTRODUODENOSCOPY (EGD);  Surgeon: Rogene Houston, MD;  [facility];  Service: Endoscopy;  Laterality: N/A;  200  . FRACTURE SURGERY    . KNEE ARTHROSCOPY Right   . LEFT HEART CATH AND CORONARY ANGIOGRAPHY N/A 02/12/2018   Procedure: LEFT HEART CATH AND CORONARY ANGIOGRAPHY;  Surgeon: Troy Sine, MD;  Location: [facility];  Service: Cardiovascular;  Laterality: N/A;  . LUMBAR [place]    . POLYPECTOMY  11/29/2017   Procedure: POLYPECTOMY;  Surgeon: Rogene Houston, MD;  [facility];  Service: Endoscopy;;  transverse  . SHOULDER ARTHROSCOPY WITH ROTATOR CUFF REPAIR AND OPEN BICEPS TENODESIS Left 12/30/2018   Procedure: LEFT SHOULDER ARTHROSCOPY WITH ROTATOR CUFF REPAIR;  Surgeon: Netta Cedars, MD;  Location: [facility];  Service: Orthopedics;  Laterality: Left;  . TONSILLECTOMY  1958  . WRIST FRACTURE SURGERY Left 1994    Current Outpatient Medications  Medication Sig Dispense Refill  . apixaban (ELIQUIS) 5 MG TABS tablet Take 1 tablet (5 mg total) by mouth 2 (two) times daily. 180 tablet 2  . buPROPion (WELLBUTRIN) 100 MG tablet Take 100 mg by mouth 2 (two) times daily.    . carbidopa-levodopa (SINEMET CR) 50-200 MG tablet Take 1 tablet by  mouth at bedtime.     . carbidopa-levodopa (SINEMET IR) 25-250 MG tablet Take 1 tablet by mouth 3 (three) times daily.     . carboxymethylcellulose (REFRESH PLUS) 0.5 % SOLN Place 1 drop into both eyes 3 (three) times daily as needed (for dry eyes).     . Cholecalciferol (VITAMIN D3) 1000 units CAPS Take 1,000 Units by mouth daily.     Marland Kitchen docusate sodium (COLACE) 100 MG capsule Take 100 mg by mouth daily.    . Melatonin 3 MG TABS Take 6 mg by mouth at bedtime.     . memantine (NAMENDA) 10 MG tablet Take 10 mg by mouth 2 (two) times daily.    . nitroGLYCERIN (NITROSTAT) 0.4 MG SL tablet Place 1  tablet (0.4 mg total) under the tongue every 5 (five) minutes as needed for chest pain. 75 tablet 3  . omeprazole (PRILOSEC) 20 MG capsule Take 20 mg by mouth 2 (two) times daily before a meal.     . polyethylene glycol powder (GLYCOLAX/MIRALAX) powder Take 8.5 g by mouth daily as needed. (Patient taking differently: Take 17 g by mouth daily as needed for mild constipation.) 255 g 0  . rosuvastatin (CRESTOR) 5 MG tablet Take 2.5 mg by mouth daily.     . trospium (SANCTURA) 20 MG tablet Take 1 tablet (20 mg total) by mouth 2 (two) times daily. 60 tablet 11   No current facility-administered medications for this visit.   Allergies:  Iodinated diagnostic agents, Prednisone, and Sulfa drugs cross reactors   ROS: No palpitations or syncope.  Physical Exam: VS:  BP 110/82   Pulse 65   Ht 5\' 7"  (1.702 m)   Wt 220 lb (99.8 kg)   SpO2 98%   BMI 34.46 kg/m , BMI Body mass index is 34.46 kg/m.  Wt Readings from Last 3 Encounters:  09/29/20 220 lb (99.8 kg)  09/13/20 218 lb (98.9 kg)  07/16/20 220 lb 12.8 oz (100.2 kg)    General: Patient appears comfortable at rest. HEENT: Conjunctiva and lids normal, wearing a mask. Neck: Supple, no elevated JVP or carotid bruits, no thyromegaly. Lungs: Clear to auscultation, nonlabored breathing at rest. Cardiac: Regular rate and rhythm, no S3 or significant systolic murmur, no pericardial rub. Extremities: No pitting edema.  ECG:  An ECG dated 03/23/2020 was personally reviewed today and demonstrated:  Sinus bradycardia.  Recent Labwork: 03/11/2020: BUN 12; Creat 0.98; Hemoglobin 15.1; Platelets 165; Potassium 5.1; Sodium 141     Component Value Date/Time   CHOL 132 02/12/2018 0344   TRIG 84 02/12/2018 0344   HDL 50 02/12/2018 0344   CHOLHDL 2.6 02/12/2018 0344   VLDL 17 02/12/2018 0344   LDLCALC 65 02/12/2018 0344  November 2021: Hemoglobin 14.7, platelets 159, BUN 18, creatinine 1.06, potassium 4.4, AST 15, ALT 9  Other Studies Reviewed  Today:  Cardiac catheterization 02/12/2018:  Prox RCA to Mid RCA lesion is 20% stenosed.  Ost 2nd Mrg lesion is 50% stenosed.  Ost 1st Diag to 1st Diag lesion is 40% stenosed.  Prox LAD lesion is 20% stenosed.  Evidence for mild coronary calcification in all coronary arteries with 20% smooth LAD stenosis, 40% mid first diagonal stenosis; 50% proximal narrowing in the circumflex marginal branch, and large dominant RCA with smooth 20% mid stenosis.  LVEDP 19 mmHg.  Echocardiogram 02/09/2018: Study Conclusions  - Left ventricle: The cavity size was normal. Wall thickness was normal. Systolic function was vigorous. The estimated ejection fraction was in the  range of 65% to 70%. Wall motion was normal; there were no regional wall motion abnormalities.  Assessment and Plan:  1.  History of atrial fibrillation.  No active palpitations.  CHA2DS2-VASc score is 3.  He remains on Eliquis for stroke prophylaxis.  He is no longer on low-dose Lopressor due to low blood pressures.  2.  Mild to moderate nonobstructive CAD.  Continue statin therapy, as needed nitroglycerin available.  Medication Adjustments/Labs and Tests Ordered: Current medicines are reviewed at length with the patient today.  Concerns regarding medicines are outlined above.   Tests Ordered: No orders of the defined types were placed in this encounter.   Medication Changes: No orders of the defined types were placed in this encounter.   Disposition:  Follow up 6 months in the [place] office.  Signed, Satira Sark, MD, [facility] 09/29/2020 9:44 AM    [facility] [place], [place], [place] [address] Phone: [phone]; Fax: [phone]

## 2020-10-01 DIAGNOSIS — Z86010 Personal history of colonic polyps: Secondary | ICD-10-CM | POA: Diagnosis not present

## 2020-10-01 DIAGNOSIS — Z1211 Encounter for screening for malignant neoplasm of colon: Secondary | ICD-10-CM | POA: Diagnosis not present

## 2020-10-01 DIAGNOSIS — Z87891 Personal history of nicotine dependence: Secondary | ICD-10-CM | POA: Diagnosis not present

## 2020-10-01 DIAGNOSIS — K635 Polyp of colon: Secondary | ICD-10-CM | POA: Diagnosis not present

## 2020-10-01 DIAGNOSIS — Z882 Allergy status to sulfonamides status: Secondary | ICD-10-CM | POA: Diagnosis not present

## 2020-10-01 DIAGNOSIS — I10 Essential (primary) hypertension: Secondary | ICD-10-CM | POA: Diagnosis not present

## 2020-10-01 DIAGNOSIS — D126 Benign neoplasm of colon, unspecified: Secondary | ICD-10-CM | POA: Diagnosis not present

## 2020-10-01 DIAGNOSIS — Z7901 Long term (current) use of anticoagulants: Secondary | ICD-10-CM | POA: Diagnosis not present

## 2020-10-01 DIAGNOSIS — E7800 Pure hypercholesterolemia, unspecified: Secondary | ICD-10-CM | POA: Diagnosis not present

## 2020-10-01 DIAGNOSIS — K5730 Diverticulosis of large intestine without perforation or abscess without bleeding: Secondary | ICD-10-CM | POA: Diagnosis not present

## 2020-10-01 DIAGNOSIS — K641 Second degree hemorrhoids: Secondary | ICD-10-CM | POA: Diagnosis not present

## 2020-10-01 DIAGNOSIS — Z79899 Other long term (current) drug therapy: Secondary | ICD-10-CM | POA: Diagnosis not present

## 2020-10-01 DIAGNOSIS — G20 Parkinson's disease: Secondary | ICD-10-CM | POA: Diagnosis not present

## 2020-10-01 DIAGNOSIS — D122 Benign neoplasm of ascending colon: Secondary | ICD-10-CM | POA: Diagnosis not present

## 2020-10-01 DIAGNOSIS — K219 Gastro-esophageal reflux disease without esophagitis: Secondary | ICD-10-CM | POA: Diagnosis not present

## 2020-10-01 DIAGNOSIS — K644 Residual hemorrhoidal skin tags: Secondary | ICD-10-CM | POA: Diagnosis not present

## 2020-10-08 IMAGING — CR DG CHEST 1V PORT
1 series · 1 of 1 positions shown · non-contrast
Comparison: 02/08/2018

CLINICAL DATA: Fever.  Tachycardia.  Previous myocardial infarct.

EXAM:
PORTABLE CHEST 1 VIEW

[ap]
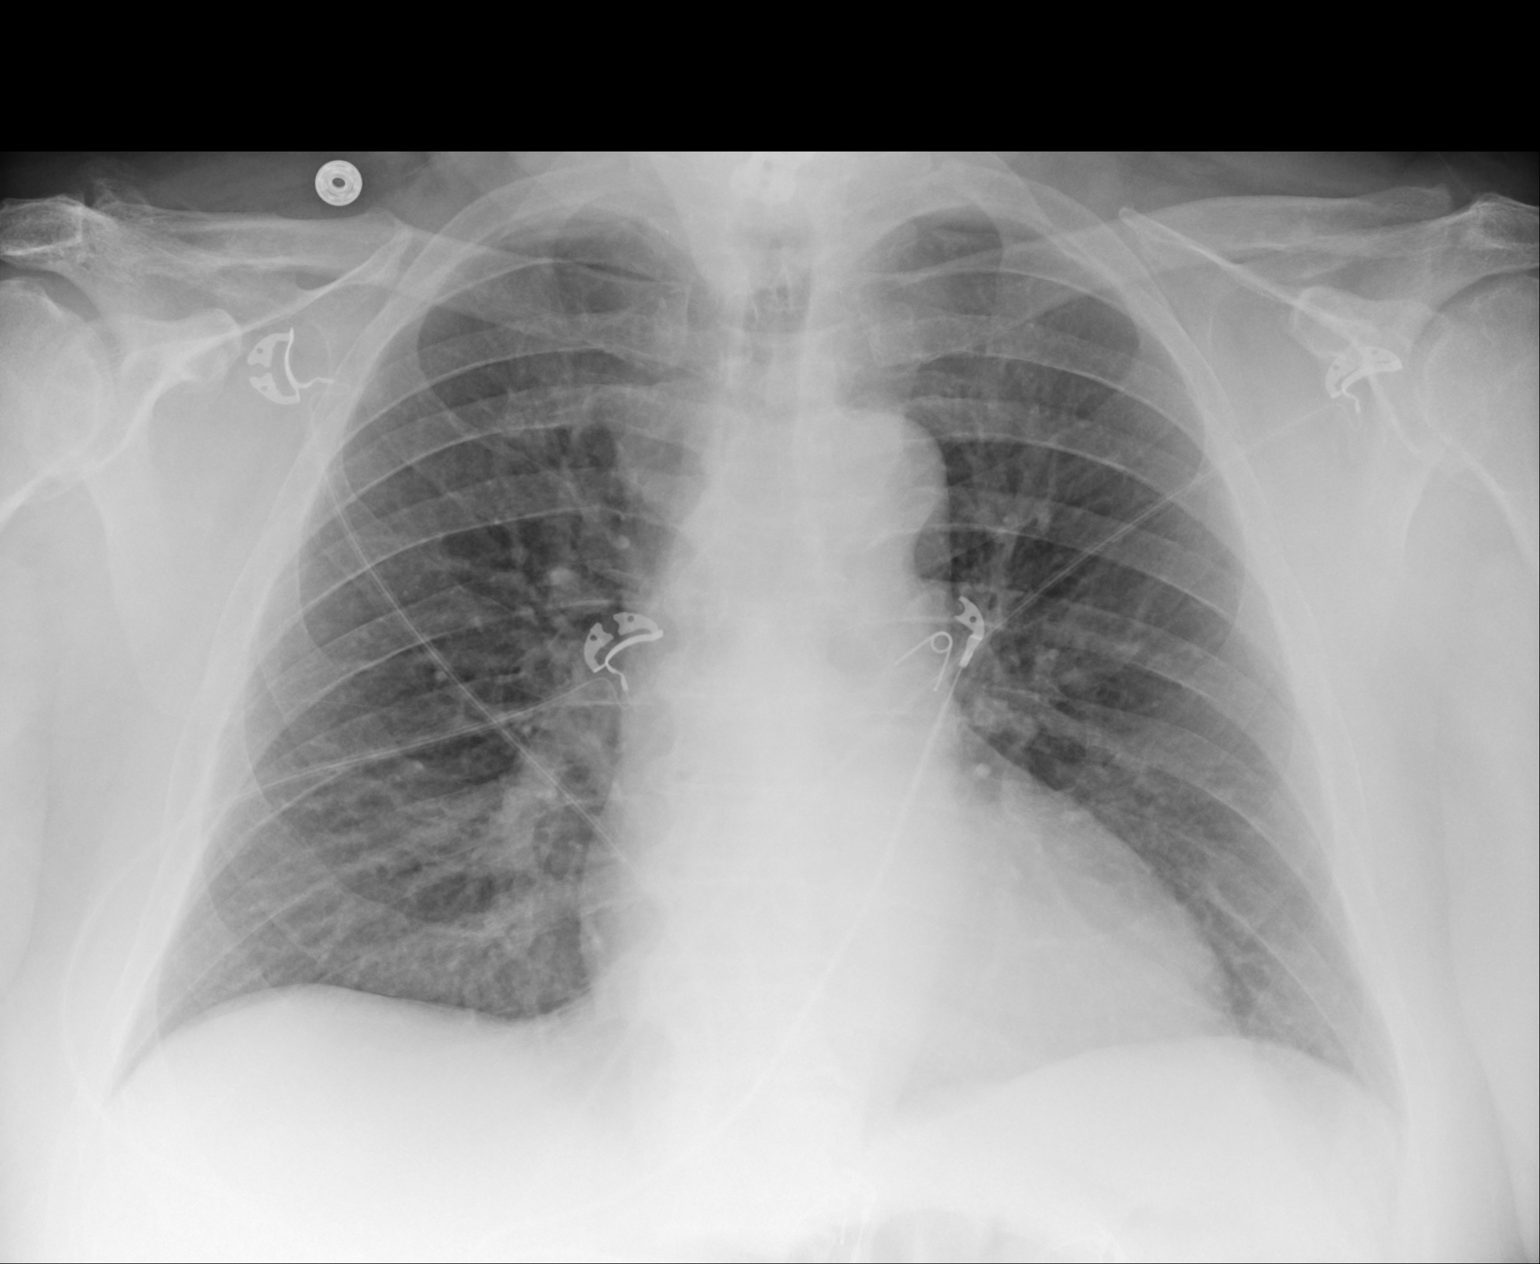

[1 of 1 positions shown; findings below may reference images not displayed]

FINDINGS: The heart size and mediastinal contours are within normal limits.
Both lungs are clear. The visualized skeletal structures are
unremarkable.
IMPRESSION: No active disease.

## 2020-10-20 DIAGNOSIS — D126 Benign neoplasm of colon, unspecified: Secondary | ICD-10-CM | POA: Diagnosis not present

## 2020-10-27 DIAGNOSIS — I4891 Unspecified atrial fibrillation: Secondary | ICD-10-CM | POA: Diagnosis not present

## 2020-10-27 DIAGNOSIS — E7849 Other hyperlipidemia: Secondary | ICD-10-CM | POA: Diagnosis not present

## 2020-10-27 DIAGNOSIS — E785 Hyperlipidemia, unspecified: Secondary | ICD-10-CM | POA: Diagnosis not present

## 2020-11-16 DIAGNOSIS — R0781 Pleurodynia: Secondary | ICD-10-CM | POA: Diagnosis not present

## 2020-11-16 DIAGNOSIS — S2232XA Fracture of one rib, left side, initial encounter for closed fracture: Secondary | ICD-10-CM | POA: Diagnosis not present

## 2020-11-16 DIAGNOSIS — Z299 Encounter for prophylactic measures, unspecified: Secondary | ICD-10-CM | POA: Diagnosis not present

## 2020-11-16 DIAGNOSIS — G20 Parkinson's disease: Secondary | ICD-10-CM | POA: Diagnosis not present

## 2020-11-16 DIAGNOSIS — I4891 Unspecified atrial fibrillation: Secondary | ICD-10-CM | POA: Diagnosis not present

## 2020-11-16 DIAGNOSIS — F4310 Post-traumatic stress disorder, unspecified: Secondary | ICD-10-CM | POA: Diagnosis not present

## 2020-11-16 DIAGNOSIS — I700 Atherosclerosis of aorta: Secondary | ICD-10-CM | POA: Diagnosis not present

## 2020-12-07 DIAGNOSIS — Z299 Encounter for prophylactic measures, unspecified: Secondary | ICD-10-CM | POA: Diagnosis not present

## 2020-12-07 DIAGNOSIS — Z79899 Other long term (current) drug therapy: Secondary | ICD-10-CM | POA: Diagnosis not present

## 2020-12-07 DIAGNOSIS — Z0000 Encounter for general adult medical examination without abnormal findings: Secondary | ICD-10-CM | POA: Diagnosis not present

## 2020-12-07 DIAGNOSIS — Z125 Encounter for screening for malignant neoplasm of prostate: Secondary | ICD-10-CM | POA: Diagnosis not present

## 2020-12-07 DIAGNOSIS — E7800 Pure hypercholesterolemia, unspecified: Secondary | ICD-10-CM | POA: Diagnosis not present

## 2020-12-07 DIAGNOSIS — Z7189 Other specified counseling: Secondary | ICD-10-CM | POA: Diagnosis not present

## 2020-12-07 DIAGNOSIS — Z6833 Body mass index (BMI) 33.0-33.9, adult: Secondary | ICD-10-CM | POA: Diagnosis not present

## 2020-12-07 DIAGNOSIS — R5383 Other fatigue: Secondary | ICD-10-CM | POA: Diagnosis not present

## 2020-12-07 DIAGNOSIS — I10 Essential (primary) hypertension: Secondary | ICD-10-CM | POA: Diagnosis not present

## 2020-12-07 DIAGNOSIS — Z1331 Encounter for screening for depression: Secondary | ICD-10-CM | POA: Diagnosis not present

## 2020-12-07 DIAGNOSIS — Z1339 Encounter for screening examination for other mental health and behavioral disorders: Secondary | ICD-10-CM | POA: Diagnosis not present

## 2020-12-31 DIAGNOSIS — I739 Peripheral vascular disease, unspecified: Secondary | ICD-10-CM | POA: Diagnosis not present

## 2020-12-31 DIAGNOSIS — M1000 Idiopathic gout, unspecified site: Secondary | ICD-10-CM | POA: Diagnosis not present

## 2020-12-31 DIAGNOSIS — Z86010 Personal history of colonic polyps: Secondary | ICD-10-CM | POA: Diagnosis not present

## 2020-12-31 DIAGNOSIS — Z1331 Encounter for screening for depression: Secondary | ICD-10-CM | POA: Diagnosis not present

## 2020-12-31 DIAGNOSIS — I10 Essential (primary) hypertension: Secondary | ICD-10-CM | POA: Diagnosis not present

## 2020-12-31 DIAGNOSIS — Z0000 Encounter for general adult medical examination without abnormal findings: Secondary | ICD-10-CM | POA: Diagnosis not present

## 2020-12-31 DIAGNOSIS — Z6841 Body Mass Index (BMI) 40.0 and over, adult: Secondary | ICD-10-CM | POA: Diagnosis not present

## 2020-12-31 DIAGNOSIS — E782 Mixed hyperlipidemia: Secondary | ICD-10-CM | POA: Diagnosis not present

## 2020-12-31 DIAGNOSIS — R5383 Other fatigue: Secondary | ICD-10-CM | POA: Diagnosis not present

## 2020-12-31 DIAGNOSIS — E1142 Type 2 diabetes mellitus with diabetic polyneuropathy: Secondary | ICD-10-CM | POA: Diagnosis not present

## 2020-12-31 DIAGNOSIS — Z1339 Encounter for screening examination for other mental health and behavioral disorders: Secondary | ICD-10-CM | POA: Diagnosis not present

## 2020-12-31 DIAGNOSIS — R9431 Abnormal electrocardiogram [ECG] [EKG]: Secondary | ICD-10-CM | POA: Diagnosis not present

## 2020-12-31 DIAGNOSIS — E559 Vitamin D deficiency, unspecified: Secondary | ICD-10-CM | POA: Diagnosis not present

## 2020-12-31 DIAGNOSIS — E1140 Type 2 diabetes mellitus with diabetic neuropathy, unspecified: Secondary | ICD-10-CM | POA: Diagnosis not present

## 2021-01-24 DIAGNOSIS — M5441 Lumbago with sciatica, right side: Secondary | ICD-10-CM | POA: Diagnosis not present

## 2021-01-24 DIAGNOSIS — M5416 Radiculopathy, lumbar region: Secondary | ICD-10-CM | POA: Diagnosis not present

## 2021-01-24 DIAGNOSIS — M545 Low back pain, unspecified: Secondary | ICD-10-CM | POA: Diagnosis not present

## 2021-01-24 DIAGNOSIS — G20 Parkinson's disease: Secondary | ICD-10-CM | POA: Diagnosis not present

## 2021-01-27 DIAGNOSIS — I10 Essential (primary) hypertension: Secondary | ICD-10-CM | POA: Diagnosis not present

## 2021-01-27 DIAGNOSIS — E7849 Other hyperlipidemia: Secondary | ICD-10-CM | POA: Diagnosis not present

## 2021-01-27 DIAGNOSIS — K219 Gastro-esophageal reflux disease without esophagitis: Secondary | ICD-10-CM | POA: Diagnosis not present

## 2021-02-02 DIAGNOSIS — G20 Parkinson's disease: Secondary | ICD-10-CM | POA: Diagnosis not present

## 2021-02-02 DIAGNOSIS — M545 Low back pain, unspecified: Secondary | ICD-10-CM | POA: Diagnosis not present

## 2021-02-02 DIAGNOSIS — M2560 Stiffness of unspecified joint, not elsewhere classified: Secondary | ICD-10-CM | POA: Diagnosis not present

## 2021-02-02 DIAGNOSIS — M6281 Muscle weakness (generalized): Secondary | ICD-10-CM | POA: Diagnosis not present

## 2021-02-02 DIAGNOSIS — R262 Difficulty in walking, not elsewhere classified: Secondary | ICD-10-CM | POA: Diagnosis not present

## 2021-02-08 DIAGNOSIS — G20 Parkinson's disease: Secondary | ICD-10-CM | POA: Diagnosis not present

## 2021-02-08 DIAGNOSIS — M545 Low back pain, unspecified: Secondary | ICD-10-CM | POA: Diagnosis not present

## 2021-02-08 DIAGNOSIS — M6281 Muscle weakness (generalized): Secondary | ICD-10-CM | POA: Diagnosis not present

## 2021-02-08 DIAGNOSIS — R262 Difficulty in walking, not elsewhere classified: Secondary | ICD-10-CM | POA: Diagnosis not present

## 2021-02-08 DIAGNOSIS — M2560 Stiffness of unspecified joint, not elsewhere classified: Secondary | ICD-10-CM | POA: Diagnosis not present

## 2021-02-15 DIAGNOSIS — M6281 Muscle weakness (generalized): Secondary | ICD-10-CM | POA: Diagnosis not present

## 2021-02-15 DIAGNOSIS — M545 Low back pain, unspecified: Secondary | ICD-10-CM | POA: Diagnosis not present

## 2021-02-15 DIAGNOSIS — R262 Difficulty in walking, not elsewhere classified: Secondary | ICD-10-CM | POA: Diagnosis not present

## 2021-02-15 DIAGNOSIS — G20 Parkinson's disease: Secondary | ICD-10-CM | POA: Diagnosis not present

## 2021-02-15 DIAGNOSIS — M2560 Stiffness of unspecified joint, not elsewhere classified: Secondary | ICD-10-CM | POA: Diagnosis not present

## 2021-02-17 DIAGNOSIS — G20 Parkinson's disease: Secondary | ICD-10-CM | POA: Diagnosis not present

## 2021-02-17 DIAGNOSIS — M2560 Stiffness of unspecified joint, not elsewhere classified: Secondary | ICD-10-CM | POA: Diagnosis not present

## 2021-02-17 DIAGNOSIS — M545 Low back pain, unspecified: Secondary | ICD-10-CM | POA: Diagnosis not present

## 2021-02-17 DIAGNOSIS — R262 Difficulty in walking, not elsewhere classified: Secondary | ICD-10-CM | POA: Diagnosis not present

## 2021-02-17 DIAGNOSIS — M6281 Muscle weakness (generalized): Secondary | ICD-10-CM | POA: Diagnosis not present

## 2021-02-22 DIAGNOSIS — M2560 Stiffness of unspecified joint, not elsewhere classified: Secondary | ICD-10-CM | POA: Diagnosis not present

## 2021-02-22 DIAGNOSIS — G20 Parkinson's disease: Secondary | ICD-10-CM | POA: Diagnosis not present

## 2021-02-22 DIAGNOSIS — R262 Difficulty in walking, not elsewhere classified: Secondary | ICD-10-CM | POA: Diagnosis not present

## 2021-02-22 DIAGNOSIS — M545 Low back pain, unspecified: Secondary | ICD-10-CM | POA: Diagnosis not present

## 2021-02-22 DIAGNOSIS — M6281 Muscle weakness (generalized): Secondary | ICD-10-CM | POA: Diagnosis not present

## 2021-02-24 DIAGNOSIS — M545 Low back pain, unspecified: Secondary | ICD-10-CM | POA: Diagnosis not present

## 2021-02-24 DIAGNOSIS — R262 Difficulty in walking, not elsewhere classified: Secondary | ICD-10-CM | POA: Diagnosis not present

## 2021-02-24 DIAGNOSIS — M6281 Muscle weakness (generalized): Secondary | ICD-10-CM | POA: Diagnosis not present

## 2021-02-24 DIAGNOSIS — G20 Parkinson's disease: Secondary | ICD-10-CM | POA: Diagnosis not present

## 2021-02-24 DIAGNOSIS — M2560 Stiffness of unspecified joint, not elsewhere classified: Secondary | ICD-10-CM | POA: Diagnosis not present

## 2021-03-01 DIAGNOSIS — G20 Parkinson's disease: Secondary | ICD-10-CM | POA: Diagnosis not present

## 2021-03-01 DIAGNOSIS — M2560 Stiffness of unspecified joint, not elsewhere classified: Secondary | ICD-10-CM | POA: Diagnosis not present

## 2021-03-01 DIAGNOSIS — M545 Low back pain, unspecified: Secondary | ICD-10-CM | POA: Diagnosis not present

## 2021-03-01 DIAGNOSIS — R262 Difficulty in walking, not elsewhere classified: Secondary | ICD-10-CM | POA: Diagnosis not present

## 2021-03-01 DIAGNOSIS — M6281 Muscle weakness (generalized): Secondary | ICD-10-CM | POA: Diagnosis not present

## 2021-03-02 DIAGNOSIS — E782 Mixed hyperlipidemia: Secondary | ICD-10-CM | POA: Diagnosis not present

## 2021-03-02 DIAGNOSIS — M1000 Idiopathic gout, unspecified site: Secondary | ICD-10-CM | POA: Diagnosis not present

## 2021-03-02 DIAGNOSIS — E1140 Type 2 diabetes mellitus with diabetic neuropathy, unspecified: Secondary | ICD-10-CM | POA: Diagnosis not present

## 2021-03-03 DIAGNOSIS — M545 Low back pain, unspecified: Secondary | ICD-10-CM | POA: Diagnosis not present

## 2021-03-03 DIAGNOSIS — G20 Parkinson's disease: Secondary | ICD-10-CM | POA: Diagnosis not present

## 2021-03-03 DIAGNOSIS — R262 Difficulty in walking, not elsewhere classified: Secondary | ICD-10-CM | POA: Diagnosis not present

## 2021-03-03 DIAGNOSIS — M2560 Stiffness of unspecified joint, not elsewhere classified: Secondary | ICD-10-CM | POA: Diagnosis not present

## 2021-03-03 DIAGNOSIS — M6281 Muscle weakness (generalized): Secondary | ICD-10-CM | POA: Diagnosis not present

## 2021-03-09 DIAGNOSIS — G20 Parkinson's disease: Secondary | ICD-10-CM | POA: Diagnosis not present

## 2021-03-09 DIAGNOSIS — M2560 Stiffness of unspecified joint, not elsewhere classified: Secondary | ICD-10-CM | POA: Diagnosis not present

## 2021-03-09 DIAGNOSIS — M6281 Muscle weakness (generalized): Secondary | ICD-10-CM | POA: Diagnosis not present

## 2021-03-09 DIAGNOSIS — R262 Difficulty in walking, not elsewhere classified: Secondary | ICD-10-CM | POA: Diagnosis not present

## 2021-03-09 DIAGNOSIS — M545 Low back pain, unspecified: Secondary | ICD-10-CM | POA: Diagnosis not present

## 2021-03-14 DIAGNOSIS — R262 Difficulty in walking, not elsewhere classified: Secondary | ICD-10-CM | POA: Diagnosis not present

## 2021-03-14 DIAGNOSIS — M6281 Muscle weakness (generalized): Secondary | ICD-10-CM | POA: Diagnosis not present

## 2021-03-14 DIAGNOSIS — M545 Low back pain, unspecified: Secondary | ICD-10-CM | POA: Diagnosis not present

## 2021-03-14 DIAGNOSIS — M2560 Stiffness of unspecified joint, not elsewhere classified: Secondary | ICD-10-CM | POA: Diagnosis not present

## 2021-03-14 DIAGNOSIS — G20 Parkinson's disease: Secondary | ICD-10-CM | POA: Diagnosis not present

## 2021-03-16 ENCOUNTER — Ambulatory Visit: Payer: MEDICARE | Admitting: Urology

## 2021-03-16 DIAGNOSIS — M6281 Muscle weakness (generalized): Secondary | ICD-10-CM | POA: Diagnosis not present

## 2021-03-16 DIAGNOSIS — M2560 Stiffness of unspecified joint, not elsewhere classified: Secondary | ICD-10-CM | POA: Diagnosis not present

## 2021-03-16 DIAGNOSIS — R262 Difficulty in walking, not elsewhere classified: Secondary | ICD-10-CM | POA: Diagnosis not present

## 2021-03-16 DIAGNOSIS — M545 Low back pain, unspecified: Secondary | ICD-10-CM | POA: Diagnosis not present

## 2021-03-16 DIAGNOSIS — G20 Parkinson's disease: Secondary | ICD-10-CM | POA: Diagnosis not present

## 2021-03-21 DIAGNOSIS — M2560 Stiffness of unspecified joint, not elsewhere classified: Secondary | ICD-10-CM | POA: Diagnosis not present

## 2021-03-21 DIAGNOSIS — G20 Parkinson's disease: Secondary | ICD-10-CM | POA: Diagnosis not present

## 2021-03-21 DIAGNOSIS — M6281 Muscle weakness (generalized): Secondary | ICD-10-CM | POA: Diagnosis not present

## 2021-03-21 DIAGNOSIS — M545 Low back pain, unspecified: Secondary | ICD-10-CM | POA: Diagnosis not present

## 2021-03-21 DIAGNOSIS — R262 Difficulty in walking, not elsewhere classified: Secondary | ICD-10-CM | POA: Diagnosis not present

## 2021-03-23 DIAGNOSIS — M2560 Stiffness of unspecified joint, not elsewhere classified: Secondary | ICD-10-CM | POA: Diagnosis not present

## 2021-03-23 DIAGNOSIS — M545 Low back pain, unspecified: Secondary | ICD-10-CM | POA: Diagnosis not present

## 2021-03-23 DIAGNOSIS — M6281 Muscle weakness (generalized): Secondary | ICD-10-CM | POA: Diagnosis not present

## 2021-03-23 DIAGNOSIS — R262 Difficulty in walking, not elsewhere classified: Secondary | ICD-10-CM | POA: Diagnosis not present

## 2021-03-23 DIAGNOSIS — G20 Parkinson's disease: Secondary | ICD-10-CM | POA: Diagnosis not present

## 2021-03-29 DIAGNOSIS — E860 Dehydration: Secondary | ICD-10-CM | POA: Diagnosis not present

## 2021-03-29 DIAGNOSIS — Z7901 Long term (current) use of anticoagulants: Secondary | ICD-10-CM | POA: Diagnosis not present

## 2021-03-29 DIAGNOSIS — I10 Essential (primary) hypertension: Secondary | ICD-10-CM | POA: Diagnosis not present

## 2021-03-29 DIAGNOSIS — Z87891 Personal history of nicotine dependence: Secondary | ICD-10-CM | POA: Diagnosis not present

## 2021-03-29 DIAGNOSIS — G20 Parkinson's disease: Secondary | ICD-10-CM | POA: Diagnosis not present

## 2021-03-29 DIAGNOSIS — I2510 Atherosclerotic heart disease of native coronary artery without angina pectoris: Secondary | ICD-10-CM | POA: Diagnosis not present

## 2021-03-29 DIAGNOSIS — N3289 Other specified disorders of bladder: Secondary | ICD-10-CM | POA: Diagnosis not present

## 2021-03-29 DIAGNOSIS — R079 Chest pain, unspecified: Secondary | ICD-10-CM | POA: Diagnosis not present

## 2021-03-29 DIAGNOSIS — I252 Old myocardial infarction: Secondary | ICD-10-CM | POA: Diagnosis not present

## 2021-03-29 DIAGNOSIS — I4892 Unspecified atrial flutter: Secondary | ICD-10-CM | POA: Diagnosis not present

## 2021-03-29 DIAGNOSIS — E7800 Pure hypercholesterolemia, unspecified: Secondary | ICD-10-CM | POA: Diagnosis not present

## 2021-03-29 DIAGNOSIS — R110 Nausea: Secondary | ICD-10-CM | POA: Diagnosis not present

## 2021-03-29 DIAGNOSIS — Z9049 Acquired absence of other specified parts of digestive tract: Secondary | ICD-10-CM | POA: Diagnosis not present

## 2021-03-29 DIAGNOSIS — D7389 Other diseases of spleen: Secondary | ICD-10-CM | POA: Diagnosis not present

## 2021-03-29 DIAGNOSIS — R197 Diarrhea, unspecified: Secondary | ICD-10-CM | POA: Diagnosis not present

## 2021-03-29 DIAGNOSIS — R109 Unspecified abdominal pain: Secondary | ICD-10-CM | POA: Diagnosis not present

## 2021-03-29 DIAGNOSIS — K921 Melena: Secondary | ICD-10-CM | POA: Diagnosis not present

## 2021-03-29 DIAGNOSIS — I700 Atherosclerosis of aorta: Secondary | ICD-10-CM | POA: Diagnosis not present

## 2021-03-29 DIAGNOSIS — R339 Retention of urine, unspecified: Secondary | ICD-10-CM | POA: Diagnosis not present

## 2021-03-29 DIAGNOSIS — I4891 Unspecified atrial fibrillation: Secondary | ICD-10-CM | POA: Diagnosis not present

## 2021-03-29 DIAGNOSIS — R9431 Abnormal electrocardiogram [ECG] [EKG]: Secondary | ICD-10-CM | POA: Diagnosis not present

## 2021-03-30 DIAGNOSIS — E7849 Other hyperlipidemia: Secondary | ICD-10-CM | POA: Diagnosis not present

## 2021-03-30 DIAGNOSIS — K219 Gastro-esophageal reflux disease without esophagitis: Secondary | ICD-10-CM | POA: Diagnosis not present

## 2021-03-30 DIAGNOSIS — I10 Essential (primary) hypertension: Secondary | ICD-10-CM | POA: Diagnosis not present

## 2021-03-31 ENCOUNTER — Emergency Department (HOSPITAL_COMMUNITY)
Admission: EM | Admit: 2021-03-31 | Discharge: 2021-03-31 | Disposition: A | Discharge status: Home / Self Care | Payer: No Typology Code available for payment source | Attending: Emergency Medicine | Admitting: Emergency Medicine

## 2021-03-31 ENCOUNTER — Other Ambulatory Visit: Payer: Self-pay

## 2021-03-31 DIAGNOSIS — Z955 Presence of coronary angioplasty implant and graft: Secondary | ICD-10-CM | POA: Diagnosis not present

## 2021-03-31 DIAGNOSIS — K5900 Constipation, unspecified: Secondary | ICD-10-CM | POA: Insufficient documentation

## 2021-03-31 DIAGNOSIS — Z87891 Personal history of nicotine dependence: Secondary | ICD-10-CM | POA: Insufficient documentation

## 2021-03-31 DIAGNOSIS — Z79899 Other long term (current) drug therapy: Secondary | ICD-10-CM | POA: Insufficient documentation

## 2021-03-31 DIAGNOSIS — I2510 Atherosclerotic heart disease of native coronary artery without angina pectoris: Secondary | ICD-10-CM | POA: Diagnosis not present

## 2021-03-31 DIAGNOSIS — Z7901 Long term (current) use of anticoagulants: Secondary | ICD-10-CM | POA: Diagnosis not present

## 2021-03-31 DIAGNOSIS — R339 Retention of urine, unspecified: Secondary | ICD-10-CM | POA: Diagnosis not present

## 2021-03-31 DIAGNOSIS — G20 Parkinson's disease: Secondary | ICD-10-CM | POA: Insufficient documentation

## 2021-03-31 DIAGNOSIS — I10 Essential (primary) hypertension: Secondary | ICD-10-CM | POA: Insufficient documentation

## 2021-03-31 DIAGNOSIS — F0280 Dementia in other diseases classified elsewhere without behavioral disturbance: Secondary | ICD-10-CM | POA: Insufficient documentation

## 2021-03-31 DIAGNOSIS — R197 Diarrhea, unspecified: Secondary | ICD-10-CM | POA: Insufficient documentation

## 2021-03-31 DIAGNOSIS — I4891 Unspecified atrial fibrillation: Secondary | ICD-10-CM | POA: Diagnosis not present

## 2021-03-31 LAB — COMPREHENSIVE METABOLIC PANEL
ALT: 9 U/L (ref 0–44)
AST: 49 U/L — ABNORMAL HIGH (ref 15–41)
Albumin: 4.1 g/dL (ref 3.5–5.0)
Alkaline Phosphatase: 98 U/L (ref 38–126)
Anion gap: 7 (ref 5–15)
BUN: 14 mg/dL (ref 8–23)
CO2: 24 mmol/L (ref 22–32)
Calcium: 9.1 mg/dL (ref 8.9–10.3)
Chloride: 106 mmol/L (ref 98–111)
Creatinine, Ser: 0.81 mg/dL (ref 0.61–1.24)
GFR, Estimated: >60 mL/min (ref >60)
Glucose, Bld: 102 mg/dL — ABNORMAL HIGH (ref 70–99)
Potassium: 4 mmol/L (ref 3.5–5.1)
Sodium: 137 mmol/L (ref 135–145)
Total Bilirubin: 1.4 mg/dL — ABNORMAL HIGH (ref 0.3–1.2)
Total Protein: 7 g/dL (ref 6.5–8.1)

## 2021-03-31 LAB — CBC WITH DIFFERENTIAL/PLATELET
Abs Immature Granulocytes: 0.03 10*3/uL (ref 0.00–0.07)
Basophils Absolute: 0 10*3/uL (ref 0.0–0.1)
Basophils Relative: 1 %
Eosinophils Absolute: 0.1 10*3/uL (ref 0.0–0.5)
Eosinophils Relative: 1 %
HCT: 45 % (ref 39.0–52.0)
Hemoglobin: 14.9 g/dL (ref 13.0–17.0)
Immature Granulocytes: 0 %
Lymphocytes Relative: 12 %
Lymphs Abs: 1 10*3/uL (ref 0.7–4.0)
MCH: 33.9 pg (ref 26.0–34.0)
MCHC: 33.1 g/dL (ref 30.0–36.0)
MCV: 102.5 fL — ABNORMAL HIGH (ref 80.0–100.0)
Monocytes Absolute: 0.7 10*3/uL (ref 0.1–1.0)
Monocytes Relative: 9 %
Neutro Abs: 6.1 10*3/uL (ref 1.7–7.7)
Neutrophils Relative %: 77 %
Platelets: 148 10*3/uL — ABNORMAL LOW (ref 150–400)
RBC: 4.39 MIL/uL (ref 4.22–5.81)
RDW: 12 % (ref 11.5–15.5)
WBC: 8 10*3/uL (ref 4.0–10.5)
nRBC: 0 % (ref 0.0–0.2)

## 2021-03-31 LAB — LIPASE, BLOOD: Lipase: 23 U/L (ref 11–51)

## 2021-03-31 NOTE — ED Provider Notes (Signed)
[facility] EMERGENCY DEPARTMENT Provider Note   CSN: [id number] Arrival date & time: 03/31/21  1030     History Chief Complaint  Patient presents with   Urinary Retention    David Combs is a 75 y.o. male with history of Parkinson's and afib on Eliquis, who presents with urinary retention and constipation. Wife at bedside is full-time caregiver and can provide history.  Patient reports constipation x 1 week with some intermittent diarrhea of small volume with intermittent blood. Patient was seen in emergency department on 8/30, CT abdomen/pelvis at that time showed large stool burden without obstruction.  I/O catheter at that time drained 1100cc of urine. Patient has not urinated or had BM since being discharged.   Level 5 caveat due to dementia.  HPI     Past Medical History:  Diagnosis Date   Agent orange exposure    Anxiety    Arthritis    Atrial fibrillation (HCC)    Chronic lower back pain    Coronary atherosclerosis of native coronary artery    70-80% small diagonal - medically managed, LVEF 55%   Depression    Dermatophytosis of nail    ED (erectile dysfunction)    Essential hypertension    GERD (gastroesophageal reflux disease)    History of colonic polyps 10/08/2017   History of pneumonia    Lumbago    Mixed hyperlipidemia    Non-ST elevation myocardial infarction (NSTEMI) ([place]) 05/2010   Obesity    Parkinson disease ([place])    Paroxysmal atrial fibrillation (HCC)    Prostatitis    Rotator cuff tear, left    Sleep apnea    does not use CPAP    Patient Active Problem List   Diagnosis Date Noted   OAB (overactive bladder) 06/02/2020   Weak urinary stream 03/10/2020   Benign prostatic hyperplasia with urinary obstruction 03/10/2020   Erectile dysfunction due to arterial insufficiency 03/10/2020   Urinary incontinence 01/28/2020   Unstable angina ([place]) 02/11/2018   Parkinson's disease ([place]) 02/11/2018   Atrial flutter with rapid ventricular response  ([place]) 02/08/2018   History of colonic polyps 10/08/2017   Precordial chest pain 05/02/2017   Coronary atherosclerosis of native coronary artery    Essential hypertension    Hyperlipemia    Obesity     Past Surgical History:  Procedure Laterality Date   ANTERIOR CERVICAL DECOMP/DISCECTOMY FUSION     APPENDECTOMY     BACK SURGERY     CARDIAC CATHETERIZATION  X 3   CATARACT EXTRACTION W/ INTRAOCULAR LENS  IMPLANT, BILATERAL Bilateral    CHOLECYSTECTOMY OPEN  05/2014   rt upper quadrant pain    COLONOSCOPY N/A 11/29/2017   Procedure: COLONOSCOPY;  Surgeon: Rogene Houston, MD;  [facility];  Service: Endoscopy;  Laterality: N/A;  8:30   ESOPHAGOGASTRODUODENOSCOPY N/A 12/02/2014   Procedure: ESOPHAGOGASTRODUODENOSCOPY (EGD);  Surgeon: Rogene Houston, MD;  [facility];  Service: Endoscopy;  Laterality: N/A;  200   FRACTURE SURGERY     KNEE ARTHROSCOPY Right    LEFT HEART CATH AND CORONARY ANGIOGRAPHY N/A 02/12/2018   Procedure: LEFT HEART CATH AND CORONARY ANGIOGRAPHY;  Surgeon: Troy Sine, MD;  Location: [place] CV LAB;  Service: Cardiovascular;  Laterality: N/A;   LUMBAR DISC SURGERY     POLYPECTOMY  11/29/2017   Procedure: POLYPECTOMY;  Surgeon: Rogene Houston, MD;  [facility];  Service: Endoscopy;;  transverse   SHOULDER ARTHROSCOPY WITH ROTATOR CUFF REPAIR  AND OPEN BICEPS TENODESIS Left 12/30/2018   Procedure: LEFT SHOULDER ARTHROSCOPY WITH ROTATOR CUFF REPAIR;  Surgeon: Netta Cedars, MD;  Location: [place];  Service: Orthopedics;  Laterality: Left;   TONSILLECTOMY  1958   WRIST FRACTURE SURGERY Left 1994       Family History  Problem Relation Age of Onset   Lymphoma Sister     Social History   Tobacco Use   Smoking status: Former    Packs/day: 1.00    Years: 40.00    Pack years: 40.00    Types: Cigarettes    Quit date: 05/31/2002    Years since quitting: 18.8   Smokeless tobacco: Never  Substance Use  Topics   Alcohol use: Not Currently    Alcohol/week: 0.0 standard drinks   Drug use: Never    Home Medications Prior to Admission medications   Medication Sig Start Date End Date Taking? Authorizing Provider  apixaban (ELIQUIS) 5 MG TABS tablet Take 1 tablet (5 mg total) by mouth 2 (two) times daily. 04/15/19   Satira Sark, MD  buPROPion (WELLBUTRIN) 100 MG tablet Take 100 mg by mouth 2 (two) times daily.    [provider]  carbidopa-levodopa (SINEMET CR) 50-200 MG tablet Take 1 tablet by mouth at bedtime.     [provider]  carbidopa-levodopa (SINEMET IR) 25-250 MG tablet Take 1 tablet by mouth 3 (three) times daily.     [provider]  carboxymethylcellulose (REFRESH PLUS) 0.5 % SOLN Place 1 drop into both eyes 3 (three) times daily as needed (for dry eyes).     [provider]  Cholecalciferol (VITAMIN D3) 1000 units CAPS Take 1,000 Units by mouth daily.     [provider]  docusate sodium (COLACE) 100 MG capsule Take 100 mg by mouth daily.    [provider]  Melatonin 3 MG TABS Take 6 mg by mouth at bedtime.     [provider]  memantine (NAMENDA) 10 MG tablet Take 10 mg by mouth 2 (two) times daily.    [provider]  nitroGLYCERIN (NITROSTAT) 0.4 MG SL tablet Place 1 tablet (0.4 mg total) under the tongue every 5 (five) minutes as needed for chest pain. 08/06/15   Satira Sark, MD  omeprazole (PRILOSEC) 20 MG capsule Take 20 mg by mouth 2 (two) times daily before a meal.     [provider]  polyethylene glycol powder (GLYCOLAX/MIRALAX) powder Take 8.5 g by mouth daily as needed. Patient taking differently: Take 17 g by mouth daily as needed for mild constipation. 11/29/17   Rehman, Mechele Dawley, MD  rosuvastatin (CRESTOR) 5 MG tablet Take 2.5 mg by mouth daily.     [provider]  trospium (SANCTURA) 20 MG tablet Take 1 tablet (20 mg total) by mouth 2 (two) times daily. 07/16/20    McKenzie, Candee Furbish, MD    Allergies    Iodinated diagnostic agents, Prednisone, and Sulfa drugs cross reactors  Review of Systems   Review of Systems  Unable to perform ROS: Dementia   Physical Exam Updated Vital Signs BP (!) 148/78   Pulse 78   Temp 98 F (36.7 C) (Oral)   Resp 18   SpO2 99%   Physical Exam Vitals and nursing note reviewed.  Constitutional:      Appearance: Normal appearance.  HENT:     Head: Normocephalic and atraumatic.  Eyes:     Conjunctiva/sclera: Conjunctivae normal.  Cardiovascular:  Rate and Rhythm: Normal rate and regular rhythm.  Pulmonary:     Effort: Pulmonary effort is normal. No respiratory distress.     Breath sounds: Normal breath sounds.  Abdominal:     General: There is no distension.     Palpations: Abdomen is soft.     Tenderness: There is abdominal tenderness in the epigastric area. There is no guarding or rebound.  Skin:    General: Skin is warm and dry.  Neurological:     General: No focal deficit present.     Mental Status: He is alert. Mental status is at baseline.     Comments: Patient is alert, speech is clear. Difficulty following commands. 5/5 grip strength. Sensation in tact in bilateral upper and lower extremities. Can wiggle toes and plantar flex feet, has difficulty with foot dorsiflexion.    ED Results / Procedures / Treatments   Labs (all labs ordered are listed, but only abnormal results are displayed) Labs Reviewed  CBC WITH DIFFERENTIAL/PLATELET - Abnormal; Notable for the following components:      Result Value   MCV 102.5 (*)    Platelets 148 (*)    All other components within normal limits  COMPREHENSIVE METABOLIC PANEL - Abnormal; Notable for the following components:   Glucose, Bld 102 (*)    AST 49 (*)    Total Bilirubin 1.4 (*)    All other components within normal limits  LIPASE, BLOOD    EKG None  Radiology No results found.  Procedures Procedures   Medications Ordered in  ED Medications - No data to display  ED Course  I have reviewed the triage vital signs and the nursing notes.  Pertinent labs & imaging results that were available during my care of the patient were reviewed by me and considered in my medical decision making (see chart for details).    MDM Rules/Calculators/A&P                           Patient is 75 y/o male with history of Parkinson's and afib on Eliquis, who presents with urinary retention, diarrhea, and upper abdominal pain. Patient seen on 8/30 with similar symptoms, CT scan showed large stool burden without obstruction. Patient has not urinated or had formed BM since.   On exam patient's abdomen is distended with some tenderness to palpation of epigastrium. No guarding or rebound tenderness. Constipation and urinary retention likely due to autonomic dysfunction in setting of parkinson's disease. Foley catheter placed and draining. Soap sud enema performed. Patient had difficulty following commands and was stooling on the floor. On reevaluation, patient is feeling more comfortable.  Lab work and exam is reassuring that patient does not require admission or inpatient treatment of his symptoms at this time. Patient seen in conjunction with Dr Kathrynn Humble MD, who discussed with patient and wife decision to leave in foley catheter. Wife has decided to keep the catheter in and follow up with patient's urologist on 9/7. Given instructions for caring for foley. Patient is stable for discharge. Discussed reasons for returning to ED. Patient and wife are agreeable to plan.  Final Clinical Impression(s) / ED Diagnoses Final diagnoses:  Urinary retention  Constipation, unspecified constipation type    Rx / DC Orders ED Discharge Orders     None        Zavannah Deblois T, PA-C 03/31/21 [place], Ankit, MD 04/01/21 0732

## 2021-03-31 NOTE — ED Notes (Signed)
Soap suds enema used to help pt with constipation, only approx 500 ml was placed d/t pt unable to hold more currently, pt moved to bedside commode and will re-evaluate effectiveness

## 2021-03-31 NOTE — ED Triage Notes (Signed)
Abdominal pain with urinary retention for days, states has watery stools and last known formed BM unknown

## 2021-03-31 NOTE — ED Notes (Signed)
Pt with parkinsons in room with wife who provides pt's medical hx. Pt seen in [facility] ED 2 days ago, pt presents today with ongoing urinary retention and diarrhea. CT from [facility] shows large stool burden. Wife reports pt also has hemmorhoid

## 2021-03-31 NOTE — Discharge Instructions (Addendum)
We gave an enema to help his constipation, and I've attached directions for caring for the foley catheter. Make sure to follow up with his urologist at the upcoming appointment.  Continue to monitor his symptoms and please return to ED for new or worsening symptoms such as urinary retention, constipation, profuse diarrhea.

## 2021-04-02 ENCOUNTER — Encounter (HOSPITAL_COMMUNITY): Payer: Self-pay

## 2021-04-02 ENCOUNTER — Other Ambulatory Visit: Payer: Self-pay

## 2021-04-02 ENCOUNTER — Emergency Department (HOSPITAL_COMMUNITY): Payer: No Typology Code available for payment source

## 2021-04-02 ENCOUNTER — Emergency Department (HOSPITAL_COMMUNITY)
Admission: EM | Admit: 2021-04-02 | Discharge: 2021-04-02 | Disposition: A | Discharge status: Home / Self Care | Payer: No Typology Code available for payment source | Attending: Emergency Medicine | Admitting: Emergency Medicine

## 2021-04-02 DIAGNOSIS — K5900 Constipation, unspecified: Secondary | ICD-10-CM | POA: Insufficient documentation

## 2021-04-02 DIAGNOSIS — F0390 Unspecified dementia without behavioral disturbance: Secondary | ICD-10-CM | POA: Insufficient documentation

## 2021-04-02 DIAGNOSIS — I2510 Atherosclerotic heart disease of native coronary artery without angina pectoris: Secondary | ICD-10-CM | POA: Insufficient documentation

## 2021-04-02 DIAGNOSIS — Z87891 Personal history of nicotine dependence: Secondary | ICD-10-CM | POA: Diagnosis not present

## 2021-04-02 DIAGNOSIS — R1084 Generalized abdominal pain: Secondary | ICD-10-CM | POA: Diagnosis not present

## 2021-04-02 DIAGNOSIS — G20 Parkinson's disease: Secondary | ICD-10-CM | POA: Insufficient documentation

## 2021-04-02 DIAGNOSIS — N200 Calculus of kidney: Secondary | ICD-10-CM | POA: Diagnosis not present

## 2021-04-02 DIAGNOSIS — Z7901 Long term (current) use of anticoagulants: Secondary | ICD-10-CM | POA: Diagnosis not present

## 2021-04-02 DIAGNOSIS — I10 Essential (primary) hypertension: Secondary | ICD-10-CM | POA: Diagnosis not present

## 2021-04-02 DIAGNOSIS — D7389 Other diseases of spleen: Secondary | ICD-10-CM | POA: Diagnosis not present

## 2021-04-02 DIAGNOSIS — R310 Gross hematuria: Secondary | ICD-10-CM

## 2021-04-02 DIAGNOSIS — K3189 Other diseases of stomach and duodenum: Secondary | ICD-10-CM | POA: Diagnosis not present

## 2021-04-02 LAB — COMPREHENSIVE METABOLIC PANEL
ALT: 7 U/L (ref 0–44)
AST: 26 U/L (ref 15–41)
Albumin: 3.7 g/dL (ref 3.5–5.0)
Alkaline Phosphatase: 78 U/L (ref 38–126)
Anion gap: 6 (ref 5–15)
BUN: 12 mg/dL (ref 8–23)
CO2: 30 mmol/L (ref 22–32)
Calcium: 9.2 mg/dL (ref 8.9–10.3)
Chloride: 103 mmol/L (ref 98–111)
Creatinine, Ser: 0.84 mg/dL (ref 0.61–1.24)
GFR, Estimated: >60 mL/min (ref >60)
Glucose, Bld: 114 mg/dL — ABNORMAL HIGH (ref 70–99)
Potassium: 4 mmol/L (ref 3.5–5.1)
Sodium: 139 mmol/L (ref 135–145)
Total Bilirubin: 1 mg/dL (ref 0.3–1.2)
Total Protein: 6.6 g/dL (ref 6.5–8.1)

## 2021-04-02 LAB — URINALYSIS, ROUTINE W REFLEX MICROSCOPIC
Bilirubin Urine: NEGATIVE
Glucose, UA: NEGATIVE mg/dL
Ketones, ur: 15 mg/dL — AB
Nitrite: NEGATIVE
Specific Gravity, Urine: 1.02 (ref 1.005–1.030)
pH: 6 (ref 5.0–8.0)

## 2021-04-02 LAB — CBC WITH DIFFERENTIAL/PLATELET
Abs Immature Granulocytes: 0.01 10*3/uL (ref 0.00–0.07)
Basophils Absolute: 0 10*3/uL (ref 0.0–0.1)
Basophils Relative: 0 %
Eosinophils Absolute: 0.1 10*3/uL (ref 0.0–0.5)
Eosinophils Relative: 1 %
HCT: 42.8 % (ref 39.0–52.0)
Hemoglobin: 14.5 g/dL (ref 13.0–17.0)
Immature Granulocytes: 0 %
Lymphocytes Relative: 11 %
Lymphs Abs: 1.1 10*3/uL (ref 0.7–4.0)
MCH: 34.4 pg — ABNORMAL HIGH (ref 26.0–34.0)
MCHC: 33.9 g/dL (ref 30.0–36.0)
MCV: 101.7 fL — ABNORMAL HIGH (ref 80.0–100.0)
Monocytes Absolute: 0.9 10*3/uL (ref 0.1–1.0)
Monocytes Relative: 10 %
Neutro Abs: 7.3 10*3/uL (ref 1.7–7.7)
Neutrophils Relative %: 78 %
Platelets: 158 10*3/uL (ref 150–400)
RBC: 4.21 MIL/uL — ABNORMAL LOW (ref 4.22–5.81)
RDW: 12.1 % (ref 11.5–15.5)
WBC: 9.5 10*3/uL (ref 4.0–10.5)
nRBC: 0 % (ref 0.0–0.2)

## 2021-04-02 LAB — URINALYSIS, MICROSCOPIC (REFLEX)

## 2021-04-02 MED ORDER — SODIUM CHLORIDE 0.9 % IV SOLN: INTRAVENOUS | Status: DC

## 2021-04-02 MED ORDER — SODIUM CHLORIDE 0.9 % IV BOLUS
500.0000 mL | Freq: Once | INTRAVENOUS | Status: AC
  Administered 2021-04-02: 500 mL via INTRAVENOUS

## 2021-04-02 NOTE — ED Provider Notes (Signed)
[facility] EMERGENCY DEPARTMENT Provider Note   CSN: [id number] Arrival date & time: 04/02/21  1036     History No chief complaint on file.   David Gunnell is a 75 y.o. male.  She has a history of dementia and Parkinson's.  Patient brought in by wife.  She is concerned about the dark color of urine and the fact that his mental status is still slightly off from baseline for him.  Patient was seen for the same concern on August 30 at [facility].  Patient had CAT scan done there that showed a large stool burden.  Was given soapsuds enemas.  Also had In-N-Out cath that showed 1100 cc urine.  Foley catheter was not placed.  Patient does take stool softener and MiraLAX at home but only takes it once a day.  Patient returned and was seen here on September 1.  Did not have repeat CAT scan.  Just had basic labs done.  Labs done eaten without significant abnormalities.  Cannot tell whether urine culture was sent.  But on September 1 patient did have Foley catheter with leg bag placed.  Basic labs had no significant changes.  Patient is alert will answer some questions but obviously there is a component of confusion.  Patient is on the blood thinner Eliquis.  Patient has a history of atrial fibrillation.  Patient's caregiver states that he still has not been having good bowel movements.  She is worried about obstruction.  No nausea or vomiting.      Past Medical History:  Diagnosis Date   Agent orange exposure    Anxiety    Arthritis    Atrial fibrillation (HCC)    Chronic lower back pain    Coronary atherosclerosis of native coronary artery    70-80% small diagonal - medically managed, LVEF 55%   Depression    Dermatophytosis of nail    ED (erectile dysfunction)    Essential hypertension    GERD (gastroesophageal reflux disease)    History of colonic polyps 10/08/2017   History of pneumonia    Lumbago    Mixed hyperlipidemia    Non-ST elevation myocardial infarction (NSTEMI) ([place])  05/2010   Obesity    Parkinson disease ([place])    Paroxysmal atrial fibrillation (HCC)    Prostatitis    Rotator cuff tear, left    Sleep apnea    does not use CPAP    Patient Active Problem List   Diagnosis Date Noted   OAB (overactive bladder) 06/02/2020   Weak urinary stream 03/10/2020   Benign prostatic hyperplasia with urinary obstruction 03/10/2020   Erectile dysfunction due to arterial insufficiency 03/10/2020   Urinary incontinence 01/28/2020   Unstable angina ([place]) 02/11/2018   Parkinson's disease ([place]) 02/11/2018   Atrial flutter with rapid ventricular response ([place]) 02/08/2018   History of colonic polyps 10/08/2017   Precordial chest pain 05/02/2017   Coronary atherosclerosis of native coronary artery    Essential hypertension    Hyperlipemia    Obesity     Past Surgical History:  Procedure Laterality Date   ANTERIOR CERVICAL DECOMP/DISCECTOMY FUSION     APPENDECTOMY     BACK SURGERY     CARDIAC CATHETERIZATION  X 3   CATARACT EXTRACTION W/ INTRAOCULAR LENS  IMPLANT, BILATERAL Bilateral    CHOLECYSTECTOMY OPEN  05/2014   rt upper quadrant pain    COLONOSCOPY N/A 11/29/2017   Procedure: COLONOSCOPY;  Surgeon: Rogene Houston, MD;  [facility];  Service: Endoscopy;  Laterality: N/A;  8:30   ESOPHAGOGASTRODUODENOSCOPY N/A 12/02/2014   Procedure: ESOPHAGOGASTRODUODENOSCOPY (EGD);  Surgeon: Rogene Houston, MD;  [facility];  Service: Endoscopy;  Laterality: N/A;  200   FRACTURE SURGERY     KNEE ARTHROSCOPY Right    LEFT HEART CATH AND CORONARY ANGIOGRAPHY N/A 02/12/2018   Procedure: LEFT HEART CATH AND CORONARY ANGIOGRAPHY;  Surgeon: Troy Sine, MD;  Location: [place] CV LAB;  Service: Cardiovascular;  Laterality: N/A;   LUMBAR DISC SURGERY     POLYPECTOMY  11/29/2017   Procedure: POLYPECTOMY;  Surgeon: Rogene Houston, MD;  [facility];  Service: Endoscopy;;  transverse   SHOULDER ARTHROSCOPY WITH ROTATOR CUFF REPAIR AND  OPEN BICEPS TENODESIS Left 12/30/2018   Procedure: LEFT SHOULDER ARTHROSCOPY WITH ROTATOR CUFF REPAIR;  Surgeon: Netta Cedars, MD;  Location: [place];  Service: Orthopedics;  Laterality: Left;   TONSILLECTOMY  1958   WRIST FRACTURE SURGERY Left 1994       Family History  Problem Relation Age of Onset   Lymphoma Sister     Social History   Tobacco Use   Smoking status: Former    Packs/day: 1.00    Years: 40.00    Pack years: 40.00    Types: Cigarettes    Quit date: 05/31/2002    Years since quitting: 18.8   Smokeless tobacco: Never  Substance Use Topics   Alcohol use: Not Currently    Alcohol/week: 0.0 standard drinks   Drug use: Never    Home Medications Prior to Admission medications   Medication Sig Start Date End Date Taking? Authorizing Provider  apixaban (ELIQUIS) 5 MG TABS tablet Take 1 tablet (5 mg total) by mouth 2 (two) times daily. 04/15/19  Yes Satira Sark, MD  buPROPion (WELLBUTRIN) 100 MG tablet Take 100 mg by mouth 2 (two) times daily.   Yes [provider]  carbidopa-levodopa (SINEMET CR) 50-200 MG tablet Take 1 tablet by mouth at bedtime.    Yes [provider]  carbidopa-levodopa (SINEMET IR) 10-100 MG tablet Take 3 tablets by mouth 3 (three) times daily. 07/08/20  Yes [provider]  carboxymethylcellulose (REFRESH PLUS) 0.5 % SOLN Place 1 drop into both eyes 3 (three) times daily as needed (for dry eyes).    Yes [provider]  Cholecalciferol (VITAMIN D3) 1000 units CAPS Take 1,000 Units by mouth daily.    Yes [provider]  docusate sodium (COLACE) 100 MG capsule Take 100 mg by mouth daily.   Yes [provider]  Melatonin 3 MG TABS Take 10 mg by mouth at bedtime.   Yes [provider]  memantine (NAMENDA) 10 MG tablet Take 10 mg by mouth 2 (two) times daily.   Yes [provider]  nitroGLYCERIN (NITROSTAT) 0.4 MG SL tablet Place 1 tablet (0.4 mg total) under  the tongue every 5 (five) minutes as needed for chest pain. 08/06/15  Yes Satira Sark, MD  omeprazole (PRILOSEC) 20 MG capsule Take 20 mg by mouth every evening.   Yes [provider]  rosuvastatin (CRESTOR) 5 MG tablet Take 2.5 mg by mouth daily.    Yes [provider]  trospium (SANCTURA) 20 MG tablet Take 1 tablet (20 mg total) by mouth 2 (two) times daily. 07/16/20  Yes McKenzie, Candee Furbish, MD  Vitamin D-Vitamin K (K2 PLUS D3) [phone] MCG-UNIT TABS Take by mouth.   Yes [provider]  carbidopa-levodopa (SINEMET  IR) 25-250 MG tablet Take 1 tablet by mouth 3 (three) times daily.  Patient not taking: Reported on 04/02/2021    [provider]  polyethylene glycol powder (GLYCOLAX/MIRALAX) powder Take 8.5 g by mouth daily as needed. Patient taking differently: Take 17 g by mouth daily as needed for mild constipation. 11/29/17   Rogene Houston, MD    Allergies    Iodinated diagnostic agents, Prednisone, and Sulfa drugs cross reactors  Review of Systems   Review of Systems  Unable to perform ROS: Dementia   Physical Exam Updated Vital Signs BP 128/74   Pulse 70   Temp 98.1 F (36.7 C) (Oral)   Resp 20   Ht 1.702 m ('5\' 7"'$ )   Wt 93.1 kg   SpO2 98%   BMI 32.15 kg/m   Physical Exam Vitals and nursing note reviewed.  Constitutional:      General: He is not in acute distress.    Appearance: He is well-developed. He is not ill-appearing.  HENT:     Head: Normocephalic and atraumatic.  Eyes:     Conjunctiva/sclera: Conjunctivae normal.  Cardiovascular:     Rate and Rhythm: Normal rate and regular rhythm.     Heart sounds: No murmur heard. Pulmonary:     Effort: Pulmonary effort is normal. No respiratory distress.     Breath sounds: Normal breath sounds.  Abdominal:     General: There is distension.     Palpations: Abdomen is soft.     Tenderness: There is abdominal tenderness. There is no guarding.     Comments: Slight distention some  mild diffuse tenderness no guarding  Musculoskeletal:        General: Normal range of motion.     Cervical back: Neck supple. No rigidity.  Skin:    General: Skin is warm and dry.  Neurological:     Mental Status: He is alert. Mental status is at baseline.    ED Results / Procedures / Treatments   Labs (all labs ordered are listed, but only abnormal results are displayed) Labs Reviewed  COMPREHENSIVE METABOLIC PANEL - Abnormal; Notable for the following components:      Result Value   Glucose, Bld 114 (*)    All other components within normal limits  CBC WITH DIFFERENTIAL/PLATELET - Abnormal; Notable for the following components:   RBC 4.21 (*)    MCV 101.7 (*)    MCH 34.4 (*)    All other components within normal limits  URINE CULTURE  URINALYSIS, ROUTINE W REFLEX MICROSCOPIC    EKG None  Radiology CT Abdomen Pelvis Wo Contrast  Result Date: 04/02/2021 CLINICAL DATA:  Bowel obstruction suspected. Dark urine. Constipation for 1 day. EXAM: CT ABDOMEN AND PELVIS WITHOUT CONTRAST TECHNIQUE: Multidetector CT imaging of the abdomen and pelvis was performed following the standard protocol without IV contrast. COMPARISON:  CT abdomen and pelvis exam 03/29/2021 [facility]. FINDINGS: Lower chest: The lung bases are clear without focal nodule, mass, or airspace disease. Coronary artery calcifications noted. Heart is mildly enlarged. Hepatobiliary: No focal liver abnormality is seen. Status post cholecystectomy. No biliary dilatation. Pancreas: Unremarkable. No pancreatic ductal dilatation or surrounding inflammatory changes. Spleen: Scattered calcifications noted throughout the spleen. Adrenals/Urinary Tract: Adrenal glands are normal bilaterally. Two punctate nonobstructing stones are present in the lower pole of the right kidney. No additional stones are present. No obstruction is present. No mass lesion is present. Ureters are within normal limits. The urinary bladder is decompressed  with a  Foley catheter in place. Stomach/Bowel: Stomach and duodenum are within normal limits. Small bowel is unremarkable. Terminal ileum is normal. Moderate stool is present throughout the colon. No obstruction mass lesion is present. Rectum is dilated up to 7.5 cm. Vascular/Lymphatic: Atherosclerotic calcifications are present in the aorta and branch vessels without aneurysm. No significant adenopathy is present. Reproductive: Prostate is unremarkable. Other: No abdominal wall hernia or abnormality. No abdominopelvic ascites. Musculoskeletal: Degenerative changes are noted in the thoracolumbar spine. Fused anterior osteophytes are present in the lower thoracic spine. No focal osseous lesions are present. Bony pelvis intact. Mild degenerative changes of the hips are worse on the right. IMPRESSION: 1. Moderate stool throughout the colon without obstruction or mass lesion. 2. Two punctate nonobstructing stones in the lower pole of the right kidney. 3. Coronary artery disease. 4. Aortic Atherosclerosis (ICD10-I70.0). Electronically Signed   By: San Morelle M.D.   On: 04/02/2021 14:40    Procedures Procedures   Medications Ordered in ED Medications  0.9 %  sodium chloride infusion (has no administration in time range)  sodium chloride 0.9 % bolus 500 mL (500 mLs Intravenous New Bag/Given 04/02/21 1408)    ED Course  I have reviewed the triage vital signs and the nursing notes.  Pertinent labs & imaging results that were available during my care of the patient were reviewed by me and considered in my medical decision making (see chart for details).    MDM Rules/Calculators/A&P                           Patient's caregiver spouse is concerned about urinary tract infection or either urinary or bladder or bowel obstruction.  She feels that urine output is down.  But he has not been eating or drinking as well.  Patient will receive IV fluids here.  We will go ahead and repeat the CT scan of the  abdomen because there is some mild diffuse tenderness without guarding.  Urinalysis to be sent for urine culture no matter what the urinalysis results are.  Urinalysis is still pending.  The patient's basic labs including complete metabolic panel and CBC without any significant abnormalities.  Renal functions very normal.  No leukocytosis no significant anemia.  CT scan of the abdomen shows moderate stool.  No signs of obstruction.  There is some calculi up in the kidney but there is no ureteral calculi.  Patient turned over to the evening emergency physician Sherwood Gambler who will follow-up on the urinalysis.  If it seems to be consistent with urinary tract infection will need to get started on antibiotics.  Patient already has follow-up with urology scheduled for Wednesday.  Foley catheter should remain in place. Final Clinical Impression(s) / ED Diagnoses Final diagnoses:  Constipation, unspecified constipation type  Parkinson's disease [facility])    Rx / DC Orders ED Discharge Orders     None        Fredia Sorrow, MD 04/02/21 1551

## 2021-04-02 NOTE — ED Provider Notes (Signed)
Care transferred to me.  Urinalysis does not show an obvious UTI and is more consistent with hematuria.  Vitals and labs are stable.  At this point he appears stable for discharge home to follow-up with urology.  Given significant RBCs in the urine this seems unlikely to be rhabdomyolysis/myoglobinuria.    Sherwood Gambler, MD 04/02/21 [phone]

## 2021-04-02 NOTE — ED Triage Notes (Signed)
Pt presents to ED with wife. Wife concerned and states she thinks he is obstructed. Wife states his urine was the color of coffee yesterday, unable to pass urine or have BM except for small amount and confused all the time since the beginning of the week. Pt oriented to name, DOB, year and president.

## 2021-04-02 NOTE — Discharge Instructions (Addendum)
Follow-up with urology as scheduled for Wednesday.  For the constipation would increase his MiraLAX to about 4 times a day.

## 2021-04-04 LAB — URINE CULTURE: Culture: NO GROWTH

## 2021-04-06 ENCOUNTER — Other Ambulatory Visit: Payer: Self-pay

## 2021-04-06 ENCOUNTER — Encounter: Payer: Self-pay | Admitting: Urology

## 2021-04-06 ENCOUNTER — Ambulatory Visit (INDEPENDENT_AMBULATORY_CARE_PROVIDER_SITE_OTHER): Payer: MEDICARE | Admitting: Urology

## 2021-04-06 VITALS — BP 116/79 | HR 82

## 2021-04-06 DIAGNOSIS — I25119 Atherosclerotic heart disease of native coronary artery with unspecified angina pectoris: Secondary | ICD-10-CM

## 2021-04-06 DIAGNOSIS — N401 Enlarged prostate with lower urinary tract symptoms: Secondary | ICD-10-CM | POA: Diagnosis not present

## 2021-04-06 DIAGNOSIS — N138 Other obstructive and reflux uropathy: Secondary | ICD-10-CM

## 2021-04-06 DIAGNOSIS — R339 Retention of urine, unspecified: Secondary | ICD-10-CM

## 2021-04-06 NOTE — Patient Instructions (Signed)
Acute Urinary Retention, Male °Acute urinary retention is when a person cannot pee (urinate) at all, or can only pee a little. This can come on all of a sudden. If it is not treated, it can lead to kidney problems or other serious problems. °What are the causes? °A problem with the tube that drains the bladder (urethra). °Problems with the nerves in the bladder. °Tumors. °Certain medicines. °An infection. °Having trouble pooping (constipation). °What increases the risk? °Older men are more at risk because their prostate gland may become larger as they age. Other conditions also can increase risk. These include: °Diseases, such as multiple sclerosis. °Injury to the spinal cord. °Diabetes. °A condition that affects the way the brain works, such as dementia. °Holding back urine due to trauma or because you do not want to use the bathroom. °What are the signs or symptoms? °Trouble peeing. °Pain in the lower belly. °How is this treated? °Treatment for this condition may include: °Medicines. °Placing a thin, germ-free tube (catheter) into the bladder to drain pee out of the body. °Therapy to treat mental health conditions. °Treatment for conditions that may cause this. °If needed, you may be treated in the hospital for kidney problems or to manage other problems. °Follow these instructions at home: °Medicines °Take over-the-counter and prescription medicines only as told by your doctor. Ask your doctor what medicines you should stay away from. °If you were given an antibiotic medicine, take it as told by your doctor. Do not stop taking it, even if you start to feel better. °General instructions °Do not smoke or use any products that contain nicotine or tobacco. If you need help quitting, ask your doctor. °Drink enough fluid to keep your pee pale yellow. °If you were sent home with a tube that drains the bladder, take care of it as told by your doctor. °Watch for changes in your symptoms. Tell your doctor about them. °If  told, keep track of changes in your blood pressure at home. Tell your doctor about them. °Keep all follow-up visits. °Contact a doctor if: °You have spasms in your bladder that you cannot stop. °You leak pee when you have spasms. °Get help right away if: °You have chills or a fever. °You have blood in your pee. °You have a tube that drains pee from the bladder and these things happen: °The tube stops draining pee. °The tube falls out. °Summary °Acute urinary retention is when you cannot pee at all or you pee too little. °If this condition is not treated, it can lead to kidney problems or other serious problems. °If you were sent home with a tube (catheter) that drains the bladder, take care of it as told by your doctor. °Watch for changes in your symptoms. Tell your doctor about them. °This information is not intended to replace advice given to you by your health care provider. Make sure you discuss any questions you have with your health care provider. °Document Revised: 04/07/2020 Document Reviewed: 04/07/2020 °Elsevier Patient Education © 2022 Elsevier Inc. ° °

## 2021-04-06 NOTE — Progress Notes (Signed)
Urological Symptom Review  Patient is experiencing the following symptoms: Frequent urination Get up at night to urinate Have to strain to urinate Blood in urine Weak stream Erection problems (male only) Penile pain (male only)    Review of Systems  Gastrointestinal (upper)  : Negative for upper GI symptoms  Gastrointestinal (lower) : Constipation  Constitutional : Negative for symptoms  Skin: Negative for skin symptoms  Eyes: Negative for eye symptoms  Ear/Nose/Throat : Negative for Ear/Nose/Throat symptoms  Hematologic/Lymphatic: Easy bruising  Cardiovascular : Negative for cardiovascular symptoms  Respiratory : Negative for respiratory symptoms  Endocrine: Negative for endocrine symptoms  Musculoskeletal: Back pain Joint pain  Neurological: Negative for neurological symptoms  Psychologic: Depression Anxiety

## 2021-04-06 NOTE — Progress Notes (Signed)
04/06/2021 11:00 AM   David Combs 05/22/1946 [id number]  Referring provider: Glenda Chroman, MD [place],  [place] [address]  Urinary retention   HPI: David Combs is a 75yo here for followup for BPH. He was doing well until 5 days ago when he developed urinary urgency, frequency and incomplete emptying. He presented to the ER and had a foley placed. Since the foley has been placed he has had poor PO intake and is not drinking any water. His urine is very concentrated today. He remains on trospium for his urinary urgency., He cannot tolerate alpha blockers. He is requesting the foley be removed today.    PMH: Past Medical History:  Diagnosis Date   Agent orange exposure    Anxiety    Arthritis    Atrial fibrillation (HCC)    Chronic lower back pain    Coronary atherosclerosis of native coronary artery    70-80% small diagonal - medically managed, LVEF 55%   Depression    Dermatophytosis of nail    ED (erectile dysfunction)    Essential hypertension    GERD (gastroesophageal reflux disease)    History of colonic polyps 10/08/2017   History of pneumonia    Lumbago    Mixed hyperlipidemia    Non-ST elevation myocardial infarction (NSTEMI) ([facility]) 05/2010   Obesity    Parkinson disease ([place])    Paroxysmal atrial fibrillation (HCC)    Prostatitis    Rotator cuff tear, left    Sleep apnea    does not use CPAP    Surgical History: Past Surgical History:  Procedure Laterality Date   ANTERIOR CERVICAL DECOMP/DISCECTOMY FUSION     APPENDECTOMY     BACK SURGERY     CARDIAC CATHETERIZATION  X 3   CATARACT EXTRACTION W/ INTRAOCULAR LENS  IMPLANT, BILATERAL Bilateral    CHOLECYSTECTOMY OPEN  05/2014   rt upper quadrant pain    COLONOSCOPY N/A 11/29/2017   Procedure: COLONOSCOPY;  Surgeon: Rogene Houston, MD;  [facility];  Service: Endoscopy;  Laterality: N/A;  8:30   ESOPHAGOGASTRODUODENOSCOPY N/A 12/02/2014   Procedure: ESOPHAGOGASTRODUODENOSCOPY (EGD);   Surgeon: Rogene Houston, MD;  [facility]: [facility];  Service: Endoscopy;  Laterality: N/A;  200   FRACTURE SURGERY     KNEE ARTHROSCOPY Right    LEFT HEART CATH AND CORONARY ANGIOGRAPHY N/A 02/12/2018   Procedure: LEFT HEART CATH AND CORONARY ANGIOGRAPHY;  Surgeon: Troy Sine, MD;  Location: [facility];  Service: Cardiovascular;  Laterality: N/A;   LUMBAR DISC SURGERY     POLYPECTOMY  11/29/2017   Procedure: POLYPECTOMY;  Surgeon: Rogene Houston, MD;  [facility]: [facility];  Service: Endoscopy;;  transverse   SHOULDER ARTHROSCOPY WITH ROTATOR CUFF REPAIR AND OPEN BICEPS TENODESIS Left 12/30/2018   Procedure: LEFT SHOULDER ARTHROSCOPY WITH ROTATOR CUFF REPAIR;  Surgeon: Netta Cedars, MD;  Location: [facility];  Service: Orthopedics;  Laterality: Left;   TONSILLECTOMY  1958   WRIST FRACTURE SURGERY Left 1994    Home Medications:  Allergies as of 04/06/2021       Reactions   Iodinated Diagnostic Agents Swelling   Prednisone    Sulfa Drugs Cross Reactors Itching        Medication List        Accurate as of April 06, 2021 11:00 AM. If you have any questions, ask your nurse or doctor.          apixaban  5 MG Tabs tablet Commonly known as: Eliquis Take 1 tablet (5 mg total) by mouth 2 (two) times daily.   buPROPion 100 MG tablet Commonly known as: WELLBUTRIN Take 100 mg by mouth 2 (two) times daily.   carbidopa-levodopa 10-100 MG tablet Commonly known as: SINEMET IR Take 1 tablet by mouth 3 (three) times daily. What changed: Another medication with the same name was removed. Continue taking this medication, and follow the directions you see here. Changed by: Nicolette Bang, MD   carbidopa-levodopa 50-200 MG tablet Commonly known as: SINEMET CR Take 1 tablet by mouth at bedtime. What changed: Another medication with the same name was removed. Continue taking this medication, and follow the directions you see here. Changed by: Nicolette Bang, MD   carboxymethylcellulose 0.5 % Soln Commonly known as: REFRESH PLUS Place 1 drop into both eyes 3 (three) times daily as needed (for dry eyes).   docusate sodium 100 MG capsule Commonly known as: COLACE Take 100 mg by mouth daily.   famotidine 20 MG tablet Commonly known as: PEPCID Take 20 mg by mouth 2 (two) times daily.   K2 Plus D3 [phone] MCG-UNIT Tabs Take by mouth.   melatonin 3 MG Tabs tablet Take 10 mg by mouth at bedtime.   memantine 10 MG tablet Commonly known as: NAMENDA Take 10 mg by mouth 2 (two) times daily.   nitroGLYCERIN 0.4 MG SL tablet Commonly known as: NITROSTAT Place 1 tablet (0.4 mg total) under the tongue every 5 (five) minutes as needed for chest pain.   omeprazole 20 MG capsule Commonly known as: PRILOSEC Take 20 mg by mouth every evening.   polyethylene glycol powder 17 GM/SCOOP powder Commonly known as: GLYCOLAX/MIRALAX Take 8.5 g by mouth daily as needed. What changed:  how much to take reasons to take this   rosuvastatin 5 MG tablet Commonly known as: CRESTOR Take 2.5 mg by mouth daily.   trospium 20 MG tablet Commonly known as: SANCTURA Take 1 tablet (20 mg total) by mouth 2 (two) times daily.   Vitamin D3 25 MCG (1000 UT) Caps Take 1,000 Units by mouth daily.        Allergies:  Allergies  Allergen Reactions   Iodinated Diagnostic Agents Swelling   Prednisone    Sulfa Drugs Cross Reactors Itching    Family History: Family History  Problem Relation Age of Onset   Lymphoma Sister     Social History:  reports that he quit smoking about 18 years ago. His smoking use included cigarettes. He has a 40.00 pack-year smoking history. He has never used smokeless tobacco. He reports that he does not currently use alcohol. He reports that he does not use drugs.  ROS: All other review of systems were reviewed and are negative except what is noted above in HPI  Physical Exam: BP 116/79   Pulse 82    Constitutional:  Alert and oriented, No acute distress. HEENT: [place] AT, moist mucus membranes.  Trachea midline, no masses. Cardiovascular: No clubbing, cyanosis, or edema. Respiratory: Normal respiratory effort, no increased work of breathing. GI: Abdomen is soft, nontender, nondistended, no abdominal masses GU: No CVA tenderness.  Lymph: No cervical or inguinal lymphadenopathy. Skin: No rashes, bruises or suspicious lesions. Neurologic: Grossly intact, no focal deficits, moving all 4 extremities. Psychiatric: Normal mood and affect.  Laboratory Data: Lab Results  Component Value Date   WBC 9.5 04/02/2021   HGB 14.5 04/02/2021   HCT 42.8 04/02/2021   MCV 101.7 (H)  04/02/2021   PLT 158 04/02/2021    Lab Results  Component Value Date   CREATININE 0.84 04/02/2021    No results found for: PSA  No results found for: TESTOSTERONE  No results found for: HGBA1C  Urinalysis    Component Value Date/Time   COLORURINE YELLOW 04/02/2021 1557   APPEARANCEUR HAZY (A) 04/02/2021 1557   APPEARANCEUR Clear 07/16/2020 1102   LABSPEC 1.020 04/02/2021 1557   PHURINE 6.0 04/02/2021 1557   GLUCOSEU NEGATIVE 04/02/2021 1557   HGBUR LARGE (A) 04/02/2021 1557   BILIRUBINUR NEGATIVE 04/02/2021 1557   BILIRUBINUR Negative 07/16/2020 1102   KETONESUR 15 (A) 04/02/2021 1557   PROTEINUR TRACE (A) 04/02/2021 1557   UROBILINOGEN negative (A) 01/28/2020 1612   NITRITE NEGATIVE 04/02/2021 1557   LEUKOCYTESUR TRACE (A) 04/02/2021 1557    Lab Results  Component Value Date   LABMICR Comment 07/16/2020   WBCUA None seen 05/06/2020   LABEPIT None seen 05/06/2020   MUCUS Present 05/06/2020   BACTERIA FEW (A) 04/02/2021    Pertinent Imaging:  No results found for this or any previous visit.  No results found for this or any previous visit.  No results found for this or any previous visit.  No results found for this or any previous visit.  No results found for this or any previous  visit.  No results found for this or any previous visit.  No results found for this or any previous visit.  No results found for this or any previous visit.   Assessment & Plan:    1. Urinary retention -Patient to increase his water intake and I will see him back on Monday for a voiding trial  2. Benign prostatic hyperplasia with urinary obstruction -We will stop tropsium. Patient to increase water intake.    No follow-ups on file.  Nicolette Bang, MD  [facility] [place]

## 2021-04-08 ENCOUNTER — Ambulatory Visit: Payer: MEDICARE | Admitting: Cardiology

## 2021-04-11 ENCOUNTER — Encounter: Payer: Self-pay | Admitting: Urology

## 2021-04-11 ENCOUNTER — Ambulatory Visit (INDEPENDENT_AMBULATORY_CARE_PROVIDER_SITE_OTHER): Payer: MEDICARE | Admitting: Urology

## 2021-04-11 ENCOUNTER — Other Ambulatory Visit: Payer: Self-pay

## 2021-04-11 VITALS — BP 96/63 | HR 74

## 2021-04-11 DIAGNOSIS — R339 Retention of urine, unspecified: Secondary | ICD-10-CM | POA: Diagnosis not present

## 2021-04-11 MED ORDER — CIPROFLOXACIN HCL 500 MG PO TABS
500.0000 mg | ORAL_TABLET | Freq: Once | ORAL | Status: AC
  Administered 2021-04-11: 500 mg via ORAL

## 2021-04-11 NOTE — Progress Notes (Signed)
Simple Catheter Placement  Due to urinary retention patient is present today for a foley cath placement.  Patient was cleaned and prepped in a sterile fashion with betadine. A 16 FR coude foley catheter was inserted, urine return was noted  250 ml, urine was yellow in color.  The balloon was filled with 10cc of sterile water.  A leg bag was attached for drainage. Patient was also given a night bag to take home and was given instruction on how to change from one bag to another.  Patient was given instruction on proper catheter care.  Patient tolerated well, no complications were noted   Performed by: Bryleigh Ottaway LPN  Additional notes/ Follow up: Per MD note

## 2021-04-11 NOTE — Progress Notes (Signed)
     04/11/21  CC: urinary retention   HPI: Mr Wandling is a 75yo here for followup for BPH. He has failed his voiding trial today Blood pressure 96/63, pulse 74. NED. A&Ox3.   No respiratory distress   Abd soft, NT, ND Normal phallus with bilateral descended testicles  Cystoscopy Procedure Note  Patient identification was confirmed, informed consent was obtained, and patient was prepped using Betadine solution.  Lidocaine jelly was administered per urethral meatus.     Pre-Procedure: - Inspection reveals a normal caliber ureteral meatus.  Procedure: The flexible cystoscope was introduced without difficulty - No urethral strictures/lesions are present. - Enlarged prostate No median lobe - Normal bladder neck - Bilateral ureteral orifices identified - Bladder mucosa  reveals no ulcers, tumors, or lesions - No bladder stones - No trabeculation  Retroflexion shows No intravesical prostatic protrusion   Post-Procedure: - Patient tolerated the procedure well  Assessment/ Plan: We discussed the management of his BPH including continued medical therapy, Rezum, Urolift, TURP and simple prostatectomy. After discussing the options the patient has elected to proceed with Urolift. Risks/benefits/alternatives discussed.   Nicolette Bang, MD

## 2021-04-11 NOTE — Patient Instructions (Signed)
Benign Prostatic Hyperplasia Benign prostatic hyperplasia (BPH) is an enlarged prostate gland that is caused by the normal aging process and not by cancer. The prostate is a walnut-sized gland that is involved in the production of semen. It is located in front of the rectum and below the bladder. The bladder stores urine and the urethra is the tube that carries the urine out of the body. The prostate may get bigger as a man gets older. An enlarged prostate can press on the urethra. This can make it harder to pass urine. The build-up of urine in the bladder can cause infection. Back pressure and infection may progress to bladder damage and kidney (renal) failure. What are the causes? This condition is part of a normal aging process. However, not all men develop problems from this condition. If the prostate enlarges away from the urethra, urine flow will not be blocked. If it enlarges toward the urethra and compresses it, there will be problems passing urine. What increases the risk? This condition is more likely to develop in men over the age of 50 years. What are the signs or symptoms? Symptoms of this condition include: Getting up often during the night to urinate. Needing to urinate frequently during the day. Difficulty starting urine flow. Decrease in size and strength of your urine stream. Leaking (dribbling) after urinating. Inability to pass urine. This needs immediate treatment. Inability to completely empty your bladder. Pain when you pass urine. This is more common if there is also an infection. Urinary tract infection (UTI). How is this diagnosed? This condition is diagnosed based on your medical history, a physical exam, and your symptoms. Tests will also be done, such as: A post-void bladder scan. This measures any amount of urine that may remain in your bladder after you finish urinating. A digital rectal exam. In a rectal exam, your health care provider checks your prostate by  putting a lubricated, gloved finger into your rectum to feel the back of your prostate gland. This exam detects the size of your gland and any abnormal lumps or growths. An exam of your urine (urinalysis). A prostate specific antigen (PSA) screening. This is a blood test used to screen for prostate cancer. An ultrasound. This test uses sound waves to electronically produce a picture of your prostate gland. Your health care provider may refer you to a specialist in kidney and prostate diseases (urologist). How is this treated? Once symptoms begin, your health care provider will monitor your condition (active surveillance or watchful waiting). Treatment for this condition will depend on the severity of your condition. Treatment may include: Observation and yearly exams. This may be the only treatment needed if your condition and symptoms are mild. Medicines to relieve your symptoms, including: Medicines to shrink the prostate. Medicines to relax the muscle of the prostate. Surgery in severe cases. Surgery may include: Prostatectomy. In this procedure, the prostate tissue is removed completely through an open incision or with a laparoscope or robotics. Transurethral resection of the prostate (TURP). In this procedure, a tool is inserted through the opening at the tip of the penis (urethra). It is used to cut away tissue of the inner core of the prostate. The pieces are removed through the same opening of the penis. This removes the blockage. Transurethral incision (TUIP). In this procedure, small cuts are made in the prostate. This lessens the prostate's pressure on the urethra. Transurethral microwave thermotherapy (TUMT). This procedure uses microwaves to create heat. The heat destroys and removes a   small amount of prostate tissue. Transurethral needle ablation (TUNA). This procedure uses radio frequencies to destroy and remove a small amount of prostate tissue. Interstitial laser coagulation (ILC).  This procedure uses a laser to destroy and remove a small amount of prostate tissue. Transurethral electrovaporization (TUVP). This procedure uses electrodes to destroy and remove a small amount of prostate tissue. Prostatic urethral lift. This procedure inserts an implant to push the lobes of the prostate away from the urethra. Follow these instructions at home: Take over-the-counter and prescription medicines only as told by your health care provider. Monitor your symptoms for any changes. Contact your health care provider with any changes. Avoid drinking large amounts of liquid before going to bed or out in public. Avoid or reduce how much caffeine or alcohol you drink. Give yourself time when you urinate. Keep all follow-up visits as told by your health care provider. This is important. Contact a health care provider if: You have unexplained back pain. Your symptoms do not get better with treatment. You develop side effects from the medicine you are taking. Your urine becomes very dark or has a bad smell. Your lower abdomen becomes distended and you have trouble passing your urine. Get help right away if: You have a fever or chills. You suddenly cannot urinate. You feel lightheaded, or very dizzy, or you faint. There are large amounts of blood or clots in the urine. Your urinary problems become hard to manage. You develop moderate to severe low back or flank pain. The flank is the side of your body between the ribs and the hip. These symptoms may represent a serious problem that is an emergency. Do not wait to see if the symptoms will go away. Get medical help right away. Call your local emergency services (911 in the [place] Do not drive yourself to the hospital. Summary Benign prostatic hyperplasia (BPH) is an enlarged prostate that is caused by the normal aging process and not by cancer. An enlarged prostate can press on the urethra. This can make it hard to pass urine. This  condition is part of a normal aging process and is more likely to develop in men over the age of 50 years. Get help right away if you suddenly cannot urinate. This information is not intended to replace advice given to you by your health care provider. Make sure you discuss any questions you have with your health care provider. Document Revised: 10/27/2020 Document Reviewed: 03/25/2020 Elsevier Patient Education  2022 Elsevier Inc.  

## 2021-04-11 NOTE — H&P (View-Only) (Signed)
     04/11/21  CC: urinary retention   HPI: Mr Traficante is a 75yo here for followup for BPH. He has failed his voiding trial today Blood pressure 96/63, pulse 74. NED. A&Ox3.   No respiratory distress   Abd soft, NT, ND Normal phallus with bilateral descended testicles  Cystoscopy Procedure Note  Patient identification was confirmed, informed consent was obtained, and patient was prepped using Betadine solution.  Lidocaine jelly was administered per urethral meatus.     Pre-Procedure: - Inspection reveals a normal caliber ureteral meatus.  Procedure: The flexible cystoscope was introduced without difficulty - No urethral strictures/lesions are present. - Enlarged prostate No median lobe - Normal bladder neck - Bilateral ureteral orifices identified - Bladder mucosa  reveals no ulcers, tumors, or lesions - No bladder stones - No trabeculation  Retroflexion shows No intravesical prostatic protrusion   Post-Procedure: - Patient tolerated the procedure well  Assessment/ Plan: We discussed the management of his BPH including continued medical therapy, Rezum, Urolift, TURP and simple prostatectomy. After discussing the options the patient has elected to proceed with Urolift. Risks/benefits/alternatives discussed.   Nicolette Bang, MD

## 2021-04-11 NOTE — Progress Notes (Signed)
Fill and Pull Catheter Removal  Patient is present today for a catheter removal.  Patient was cleaned and prepped in a sterile fashion 284m of sterile water/ saline was instilled into the bladder when the patient felt the urge to urinate. 1488mof water was then drained from the balloon.  A 16FR foley cath was removed from the bladder no complications were noted .  Patient as then given some time to void on their own.  Patient cannot void  88m14mn their own after some time.  Patient tolerated well.  Performed by: AmaEstill Bamberg  Follow up/ Additional notes: MD to cystoscopy

## 2021-04-13 MED ORDER — CLOTRIMAZOLE-BETAMETHASONE 1-0.05 % EX CREA: 1.0000 "application " | TOPICAL_CREAM | Freq: Two times a day (BID) | CUTANEOUS | 0 refills | Status: AC

## 2021-04-13 NOTE — Addendum Note (Signed)
Addended by: Cleon Gustin on: 04/13/2021 02:22 PM   Modules accepted: Orders

## 2021-04-15 ENCOUNTER — Telehealth: Payer: Self-pay | Admitting: Cardiology

## 2021-04-15 NOTE — Telephone Encounter (Signed)
   [facility] Pre-operative Risk Assessment    Patient Name: David Combs  DOB: [birth year] MRN: [id number]  HEARTCARE STAFF:  - IMPORTANT!!!!!! Under Visit Info/Reason for Call, type in Other and utilize the format Clearance MM/DD/YY or Clearance TBD. Do not use dashes or single digits. - Please review there is not already an duplicate clearance open for this procedure. - If request is for dental extraction, please clarify the # of teeth to be extracted. - If the patient is currently at the dentist's office, call Pre-Op Callback Staff (MA/nurse) to input urgent request.  - If the patient is not currently in the dentist office, please route to the Pre-Op pool.  Request for surgical clearance:  What type of surgery is being performed cystoscopy with Urolift   When is this surgery scheduled? 05/16/21  What type of clearance is required (medical clearance vs. Pharmacy clearance to hold med vs. Both)? both  Are there any medications that need to be held prior to surgery and how long? Eliquis 2 days prior to procedure and 3 days after   Practice name and name of physician performing surgery? Dr Nicolette Bang  What is the office phone number? [phone]   7.   What is the office fax number? [phone]   8.   Anesthesia type (None, local, MAC, general) ? Monitor anesthesia   Jannet Askew 04/15/2021, 2:47 PM  _________________________________________________________________   (provider comments below)

## 2021-04-15 NOTE — Progress Notes (Signed)
Cardiology clearance sent to cardiologist.

## 2021-04-19 NOTE — Progress Notes (Signed)
Cardiology Office Note  Date: 04/20/2021   ID: Leith, Szafranski 05-Jul-1946, MRN [id number]  PCP:  Glenda Chroman, MD  Cardiologist:  Rozann Lesches, MD Electrophysiologist:  None   Chief Complaint  Patient presents with   Cardiac follow-up    History of Present Illness: David Combs is a 75 y.o. male last seen in March.  He is here today with his wife for a follow-up visit.  He does not describe any active angina symptoms or palpitations on current medical therapy.  He has an indwelling Foley catheter in place with plan for UroLift procedure per Dr. Alyson Ingles in October.  Clarification of hold on Eliquis has already been made per review with our pharmacy team.  I went over his medications which are noted below.  Today's ECG shows normal sinus rhythm.  Past Medical History:  Diagnosis Date   Agent orange exposure    Anxiety    Arthritis    Atrial fibrillation (HCC)    Chronic lower back pain    Coronary atherosclerosis of native coronary artery    70-80% small diagonal - medically managed, LVEF 55%   Depression    Dermatophytosis of nail    ED (erectile dysfunction)    Essential hypertension    GERD (gastroesophageal reflux disease)    History of colonic polyps 10/08/2017   History of pneumonia    Lumbago    Mixed hyperlipidemia    Non-ST elevation myocardial infarction (NSTEMI) ([place]) 05/2010   Obesity    Parkinson disease ([place])    Paroxysmal atrial fibrillation (HCC)    Prostatitis    Rotator cuff tear, left    Sleep apnea    does not use CPAP    Past Surgical History:  Procedure Laterality Date   ANTERIOR CERVICAL DECOMP/DISCECTOMY FUSION     APPENDECTOMY     BACK SURGERY     CARDIAC CATHETERIZATION  X 3   CATARACT EXTRACTION W/ INTRAOCULAR LENS  IMPLANT, BILATERAL Bilateral    CHOLECYSTECTOMY OPEN  05/2014   rt upper quadrant pain    COLONOSCOPY N/A 11/29/2017   Procedure: COLONOSCOPY;  Surgeon: Rogene Houston, MD;  [facility];   Service: Endoscopy;  Laterality: N/A;  8:30   ESOPHAGOGASTRODUODENOSCOPY N/A 12/02/2014   Procedure: ESOPHAGOGASTRODUODENOSCOPY (EGD);  Surgeon: Rogene Houston, MD;  [facility]: [facility];  Service: Endoscopy;  Laterality: N/A;  200   FRACTURE SURGERY     KNEE ARTHROSCOPY Right    LEFT HEART CATH AND CORONARY ANGIOGRAPHY N/A 02/12/2018   Procedure: LEFT HEART CATH AND CORONARY ANGIOGRAPHY;  Surgeon: Troy Sine, MD;  Location: [facility];  Service: Cardiovascular;  Laterality: N/A;   LUMBAR DISC SURGERY     POLYPECTOMY  11/29/2017   Procedure: POLYPECTOMY;  Surgeon: Rogene Houston, MD;  [facility]: [facility];  Service: Endoscopy;;  transverse   SHOULDER ARTHROSCOPY WITH ROTATOR CUFF REPAIR AND OPEN BICEPS TENODESIS Left 12/30/2018   Procedure: LEFT SHOULDER ARTHROSCOPY WITH ROTATOR CUFF REPAIR;  Surgeon: Netta Cedars, MD;  Location: [facility];  Service: Orthopedics;  Laterality: Left;   TONSILLECTOMY  1958   WRIST FRACTURE SURGERY Left 1994    Current Outpatient Medications  Medication Sig Dispense Refill   apixaban (ELIQUIS) 5 MG TABS tablet Take 1 tablet (5 mg total) by mouth 2 (two) times daily. 180 tablet 2   buPROPion (WELLBUTRIN) 100 MG tablet Take 100 mg by mouth 2 (two) times daily.  carbidopa-levodopa (SINEMET CR) 50-200 MG tablet Take 3 tablets by mouth at bedtime.     carbidopa-levodopa (SINEMET IR) 10-100 MG tablet Take 1 tablet by mouth 3 (three) times daily.     carboxymethylcellulose (REFRESH PLUS) 0.5 % SOLN Place 1 drop into both eyes 3 (three) times daily as needed (for dry eyes).      Cholecalciferol (VITAMIN D3) 1000 units CAPS Take 1,000 Units by mouth daily.      clotrimazole-betamethasone (LOTRISONE) cream Apply 1 application topically 2 (two) times daily. 30 g 0   docusate sodium (COLACE) 100 MG capsule Take 100 mg by mouth daily.     Melatonin 3 MG TABS Take 10 mg by mouth at bedtime.     memantine (NAMENDA) 10 MG tablet Take 10 mg  by mouth 2 (two) times daily.     nitroGLYCERIN (NITROSTAT) 0.4 MG SL tablet Place 1 tablet (0.4 mg total) under the tongue every 5 (five) minutes as needed for chest pain. 75 tablet 3   omeprazole (PRILOSEC) 20 MG capsule Take 20 mg by mouth every evening.     polyethylene glycol powder (GLYCOLAX/MIRALAX) powder Take 8.5 g by mouth daily as needed. (Patient taking differently: Take 17 g by mouth daily as needed for mild constipation.) 255 g 0   rosuvastatin (CRESTOR) 5 MG tablet Take 2.5 mg by mouth daily.      Vitamin D-Vitamin K (K2 PLUS D3) 4072046735 MCG-UNIT TABS Take by mouth.     famotidine (PEPCID) 20 MG tablet Take 20 mg by mouth 2 (two) times daily. (Patient not taking: Reported on 04/20/2021)     trospium (SANCTURA) 20 MG tablet Take 1 tablet (20 mg total) by mouth 2 (two) times daily. (Patient not taking: Reported on 04/20/2021) 60 tablet 11   No current facility-administered medications for this visit.   Allergies:  Iodinated diagnostic agents, Prednisone, and Sulfa drugs cross reactors   ROS: No syncope.  Physical Exam: VS:  BP 140/62   Pulse 68   Ht 5\' 7"  (1.702 m)   Wt 199 lb 3.2 oz (90.4 kg)   SpO2 97%   BMI 31.20 kg/m , BMI Body mass index is 31.2 kg/m.  Wt Readings from Last 3 Encounters:  04/20/21 199 lb 3.2 oz (90.4 kg)  04/02/21 205 lb 4.8 oz (93.1 kg)  09/29/20 220 lb (99.8 kg)    General: Patient appears comfortable at rest. HEENT: Conjunctiva and lids normal, wearing a mask. Neck: Supple, no elevated JVP or carotid bruits, no thyromegaly. Lungs: Clear to auscultation, nonlabored breathing at rest. Cardiac: Regular rate and rhythm, no S3 or significant systolic murmur, no pericardial rub. Extremities: No pitting edema.  Foley catheter bag strapped to left leg.  ECG:  An ECG dated 03/23/2020 was personally reviewed today and demonstrated:  Sinus bradycardia.  Recent Labwork: 04/02/2021: ALT 7; AST 26; BUN 12; Creatinine, Ser 0.84; Hemoglobin 14.5; Platelets  158; Potassium 4.0; Sodium 139     Component Value Date/Time   CHOL 132 02/12/2018 0344   TRIG 84 02/12/2018 0344   HDL 50 02/12/2018 0344   CHOLHDL 2.6 02/12/2018 0344   VLDL 17 02/12/2018 0344   LDLCALC 65 02/12/2018 0344    Other Studies Reviewed Today:  Cardiac catheterization 02/12/2018: Prox RCA to Mid RCA lesion is 20% stenosed. Ost 2nd Mrg lesion is 50% stenosed. Ost 1st Diag to 1st Diag lesion is 40% stenosed. Prox LAD lesion is 20% stenosed.   Evidence for mild coronary calcification in all coronary arteries  with 20% smooth LAD stenosis, 40% mid first diagonal stenosis; 50% proximal narrowing in the circumflex marginal branch, and large dominant RCA with smooth 20% mid stenosis.   LVEDP 19 mmHg.   Echocardiogram 02/09/2018: Study Conclusions   - Left ventricle: The cavity size was normal. Wall thickness was   normal. Systolic function was vigorous. The estimated ejection   fraction was in the range of 65% to 70%. Wall motion was normal;   there were no regional wall motion abnormalities.  Assessment and Plan:  1.  Preoperative cardiac assessment in a 75 year old male with paroxysmal atrial fibrillation on Eliquis and branch vessel CAD that has been managed medically.  He does not describe any active angina symptoms or palpitations and is in sinus rhythm by ECG today.  He is planned to undergo UroLift procedure with Dr. Alyson Ingles and should be able to proceed at relatively low risk from a cardiac perspective.  He will need to stop Eliquis 2 days prior to the operation, and can typically resume 3 days postoperatively.  2.  Paroxysmal atrial fibrillation with CHA2DS2-VASc score of 3.  He is on Eliquis for stroke prophylaxis, otherwise no AV nodal blockers at this point.  3.  Branch vessel CAD, managed medically and without active angina symptoms.  He is not on aspirin given use of Eliquis.  Continue Crestor and as needed nitroglycerin.  Medication Adjustments/Labs and Tests  Ordered: Current medicines are reviewed at length with the patient today.  Concerns regarding medicines are outlined above.   Tests Ordered: Orders Placed This Encounter  Procedures   EKG 12-Lead    Medication Changes: No orders of the defined types were placed in this encounter.   Disposition:  Follow up  6 months.  Signed, Satira Sark, MD, [facility] 04/20/2021 9:31 AM    [facility] [place], [place], [place] [address] Phone: [phone]; Fax: [phone]

## 2021-04-20 ENCOUNTER — Encounter: Payer: Self-pay | Admitting: Cardiology

## 2021-04-20 ENCOUNTER — Ambulatory Visit (INDEPENDENT_AMBULATORY_CARE_PROVIDER_SITE_OTHER): Payer: MEDICARE | Admitting: Cardiology

## 2021-04-20 VITALS — BP 140/62 | HR 68 | Ht 67.0 in | Wt 199.2 lb

## 2021-04-20 DIAGNOSIS — I25119 Atherosclerotic heart disease of native coronary artery with unspecified angina pectoris: Secondary | ICD-10-CM

## 2021-04-20 DIAGNOSIS — I480 Paroxysmal atrial fibrillation: Secondary | ICD-10-CM

## 2021-04-20 DIAGNOSIS — Z01810 Encounter for preprocedural cardiovascular examination: Secondary | ICD-10-CM | POA: Diagnosis not present

## 2021-04-20 NOTE — Patient Instructions (Signed)

## 2021-04-20 NOTE — Telephone Encounter (Signed)
Pt came into the [place] office for an apt today with Dr. Domenic Polite and stated that Dr. Noland Fordyce office is holding a date for this procedure to be done on 05/02/2021 as long as pt is cleared for surgery.

## 2021-04-20 NOTE — Telephone Encounter (Addendum)
   Patient Name: David Combs  DOB: 1946/01/31 MRN: [id number]  Primary Cardiologist: Rozann Lesches, MD  Chart reviewed as part of pre-operative protocol coverage. Patient seen today for preop clearance by Dr. Domenic Polite. Will route this message and his note to requesting surgeon. Please call with questions.  Charlie Pitter, PA-C 04/20/2021, 1:33 PM

## 2021-04-20 NOTE — Telephone Encounter (Signed)
Patient with diagnosis of afib on Eliquis for anticoagulation.    Procedure: cystoscopy with Urolift  Date of procedure: 05/16/21  CHA2DS2-VASc Score = 3   This indicates a 3.2% annual risk of stroke. The patient's score is based upon: CHF History: 0 HTN History: 1 Diabetes History: 0 Stroke History: 0 Vascular Disease History: 1 Age Score: 1 Gender Score: 0     CrCl 83 ml/min  Per office protocol, patient can hold Eliquis for 2 days prior to procedure and 3 day post.

## 2021-04-21 ENCOUNTER — Telehealth: Payer: Self-pay

## 2021-04-21 NOTE — Telephone Encounter (Signed)
Wife, Santiago Glad, notified of cardiology clearance hold for eliquis 2 days prior and 3 days after for surgery. Wife voiced understanding.

## 2021-04-27 NOTE — Patient Instructions (Signed)
David Combs  04/27/2021     @PREFPERIOPPHARMACY @   Your procedure is scheduled on  05/02/2021.    Report to [facility] at  1200  P.M.   Call this number if you have problems the morning of surgery:  [phone]   Remember:  Do not eat or drink after midnight.      Take these medicines the morning of surgery with A SIP OF WATER                   Wellbutrin, sinemet IR, aricept, namenda.     Do not wear jewelry, make-up or nail polish.  Do not wear lotions, powders, or perfumes, or deodorant.  Do not shave 48 hours prior to surgery.  Men may shave face and neck.  Do not bring valuables to the hospital.  [facility] is not responsible for any belongings or valuables.  Contacts, dentures or bridgework may not be worn into surgery.  Leave your suitcase in the car.  After surgery it may be brought to your room.  For patients admitted to the hospital, discharge time will be determined by your treatment team.  Patients discharged the day of surgery will not be allowed to drive home and must have someone with them for 24 hours.    Special instructions:   DO NOT smoke tobacco or vape for 24 hours before your procedure.  Please read over the following fact sheets that you were given. Coughing and Deep Breathing, Surgical Site Infection Prevention, Anesthesia Post-op Instructions, and Care and Recovery After Surgery        Prostatic Urethral Lift Prostatic urethral lift is a surgical procedure to relieve symptoms of prostate gland enlargement that occurs with age (benign prostatic hypertrophy, BPH). The part of the body that drains urine from the bladder (urethra) passes between the two lobes of the prostate. As the prostate enlarges, it can push on the urethra and cause problems with urinating. This procedure involves placing an implant that holds the prostate away from the urethra. The procedure is performed with a thin operating telescope (cystoscope) that is  inserted through the tip of the penis and moved up the urethra to the prostate. This is less invasive than other procedures that require an incision. You may have this procedure if: You have symptoms of BPH. Your prostate is not severely enlarged. Medicines to treat BPH are not working or not tolerated. You want to avoid possible sexual side effects from medicines or other procedures that are used to treat BPH. Tell a health care provider about: Any allergies you have. All medicines you are taking, including vitamins, herbs, eye drops, creams, and over-the-counter medicines. Previous problems you or members of your family have had with the use of anesthetics. Any blood disorders you have. Previous surgeries you have had. Any medical conditions you have. What are the risks? Bleeding. Infection. Leaking of urine (incontinence). Allergic reactions to medicines. Return of BPH symptoms after 2 years, requiring more treatment. What happens before the procedure? Ask your health care provider about: Changing or stopping your regular medicines. This is especially important if you are taking diabetes medicines or blood thinners. Taking medicines such as aspirin and ibuprofen. These medicines can thin your blood. Do not take these medicines before your procedure if your health care provider instructs you not to. Follow instructions from your health care provider about eating or drinking restrictions. Plan to have someone take you home from the  hospital or clinic. What happens during the procedure? To lower your risk of infection: Your health care team will wash or sanitize their hands. Your skin will be washed with soap. You may be given an antibiotic medicine. An IV may be inserted into one of your veins. You will be given one or more of the following: A medicine to help you relax (sedative). A medicine that is injected into your urethra to numb the area (local anesthetic). A medicine to make  you fall asleep (general anesthetic). A cystoscope will be inserted into your penis and moved through your urethra to your prostate. A device will be inserted through the cystoscope and used to press the lobes of your prostate away from your urethra. Implants will be inserted through the device to hold the lobes of your prostate in the widened position. The device and cystoscope will be removed. The procedure may vary among health care providers and hospitals. What happens after the procedure? Your blood pressure, heart rate, breathing rate, and blood oxygen level will be monitored until the medicines you were given have worn off. Do not drive for 24 hours if you were given a sedative. Summary Prostatic urethral lift is a surgical procedure to relieve symptoms of prostate gland enlargement that occurs with age (benign prostatic hypertrophy, BPH). The procedure is performed with a thin operating telescope (cystoscope) that is inserted through the tip of the penis and moved up the part of the body that drains urine from the bladder (urethra) to reach the prostate. This is less invasive than other procedures that require an incision. Plan to have someone take you home from the hospital or clinic. You will not be allowed to drive for 24 hours if you were given a sedative during the procedure. This information is not intended to replace advice given to you by your health care provider. Make sure you discuss any questions you have with your health care provider. Document Revised: 10/27/2020 Document Reviewed: 03/25/2020 Elsevier Patient Education  [place] Anesthesia, Adult, Care After This sheet gives you information about how to care for yourself after your procedure. Your health care provider may also give you more specific instructions. If you have problems or questions, contact your health care provider. What can I expect after the procedure? After the procedure, the following  side effects are common: Pain or discomfort at the IV site. Nausea. Vomiting. Sore throat. Trouble concentrating. Feeling cold or chills. Feeling weak or tired. Sleepiness and fatigue. Soreness and body aches. These side effects can affect parts of the body that were not involved in surgery. Follow these instructions at home: For the time period you were told by your health care provider:  Rest. Do not participate in activities where you could fall or become injured. Do not drive or use machinery. Do not drink alcohol. Do not take sleeping pills or medicines that cause drowsiness. Do not make important decisions or sign legal documents. Do not take care of children on your own. Eating and drinking Follow any instructions from your health care provider about eating or drinking restrictions. When you feel hungry, start by eating small amounts of foods that are soft and easy to digest (bland), such as toast. Gradually return to your regular diet. Drink enough fluid to keep your urine pale yellow. If you vomit, rehydrate by drinking water, juice, or clear broth. General instructions If you have sleep apnea, surgery and certain medicines can increase your risk for breathing problems. Follow instructions  from your health care provider about wearing your sleep device: Anytime you are sleeping, including during daytime naps. While taking prescription pain medicines, sleeping medicines, or medicines that make you drowsy. Have a responsible adult stay with you for the time you are told. It is important to have someone help care for you until you are awake and alert. Return to your normal activities as told by your health care provider. Ask your health care provider what activities are safe for you. Take over-the-counter and prescription medicines only as told by your health care provider. If you smoke, do not smoke without supervision. Keep all follow-up visits as told by your health care  provider. This is important. Contact a health care provider if: You have nausea or vomiting that does not get better with medicine. You cannot eat or drink without vomiting. You have pain that does not get better with medicine. You are unable to pass urine. You develop a skin rash. You have a fever. You have redness around your IV site that gets worse. Get help right away if: You have difficulty breathing. You have chest pain. You have blood in your urine or stool, or you vomit blood. Summary After the procedure, it is common to have a sore throat or nausea. It is also common to feel tired. Have a responsible adult stay with you for the time you are told. It is important to have someone help care for you until you are awake and alert. When you feel hungry, start by eating small amounts of foods that are soft and easy to digest (bland), such as toast. Gradually return to your regular diet. Drink enough fluid to keep your urine pale yellow. Return to your normal activities as told by your health care provider. Ask your health care provider what activities are safe for you. This information is not intended to replace advice given to you by your health care provider. Make sure you discuss any questions you have with your health care provider. Document Revised: 04/01/2020 Document Reviewed: 10/30/2019 Elsevier Patient Education  2022 [place]. How to Use Chlorhexidine for Bathing Chlorhexidine gluconate (CHG) is a germ-killing (antiseptic) solution that is used to clean the skin. It can get rid of the bacteria that normally live on the skin and can keep them away for about 24 hours. To clean your skin with CHG, you may be given: A CHG solution to use in the shower or as part of a sponge bath. A prepackaged cloth that contains CHG. Cleaning your skin with CHG may help lower the risk for infection: While you are staying in the intensive care unit of the hospital. If you have a vascular  access, such as a central line, to provide short-term or long-term access to your veins. If you have a catheter to drain urine from your bladder. If you are on a ventilator. A ventilator is a machine that helps you breathe by moving air in and out of your lungs. After surgery. What are the risks? Risks of using CHG include: A skin reaction. Hearing loss, if CHG gets in your ears and you have a perforated eardrum. Eye injury, if CHG gets in your eyes and is not rinsed out. The CHG product catching fire. Make sure that you avoid smoking and flames after applying CHG to your skin. Do not use CHG: If you have a chlorhexidine allergy or have previously reacted to chlorhexidine. On babies younger than 20 months of age. How to use CHG solution Use  CHG only as told by your health care provider, and follow the instructions on the label. Use the full amount of CHG as directed. Usually, this is one bottle. During a shower Follow these steps when using CHG solution during a shower (unless your health care provider gives you different instructions): Start the shower. Use your normal soap and shampoo to wash your face and hair. Turn off the shower or move out of the shower stream. Pour the CHG onto a clean washcloth. Do not use any type of brush or rough-edged sponge. Starting at your neck, lather your body down to your toes. Make sure you follow these instructions: If you will be having surgery, pay special attention to the part of your body where you will be having surgery. Scrub this area for at least 1 minute. Do not use CHG on your head or face. If the solution gets into your ears or eyes, rinse them well with water. Avoid your genital area. Avoid any areas of skin that have broken skin, cuts, or scrapes. Scrub your back and under your arms. Make sure to wash skin folds. Let the lather sit on your skin for 1-2 minutes or as long as told by your health care provider. Thoroughly rinse your entire  body in the shower. Make sure that all body creases and crevices are rinsed well. Dry off with a clean towel. Do not put any substances on your body afterward--such as powder, lotion, or perfume--unless you are told to do so by your health care provider. Only use lotions that are recommended by the manufacturer. Put on clean clothes or pajamas. If it is the night before your surgery, sleep in clean sheets.  During a sponge bath Follow these steps when using CHG solution during a sponge bath (unless your health care provider gives you different instructions): Use your normal soap and shampoo to wash your face and hair. Pour the CHG onto a clean washcloth. Starting at your neck, lather your body down to your toes. Make sure you follow these instructions: If you will be having surgery, pay special attention to the part of your body where you will be having surgery. Scrub this area for at least 1 minute. Do not use CHG on your head or face. If the solution gets into your ears or eyes, rinse them well with water. Avoid your genital area. Avoid any areas of skin that have broken skin, cuts, or scrapes. Scrub your back and under your arms. Make sure to wash skin folds. Let the lather sit on your skin for 1-2 minutes or as long as told by your health care provider. Using a different clean, wet washcloth, thoroughly rinse your entire body. Make sure that all body creases and crevices are rinsed well. Dry off with a clean towel. Do not put any substances on your body afterward--such as powder, lotion, or perfume--unless you are told to do so by your health care provider. Only use lotions that are recommended by the manufacturer. Put on clean clothes or pajamas. If it is the night before your surgery, sleep in clean sheets. How to use CHG prepackaged cloths Only use CHG cloths as told by your health care provider, and follow the instructions on the label. Use the CHG cloth on clean, dry skin. Do not use  the CHG cloth on your head or face unless your health care provider tells you to. When washing with the CHG cloth: Avoid your genital area. Avoid any areas of skin that  have broken skin, cuts, or scrapes. Before surgery Follow these steps when using a CHG cloth to clean before surgery (unless your health care provider gives you different instructions): Using the CHG cloth, vigorously scrub the part of your body where you will be having surgery. Scrub using a back-and-forth motion for 3 minutes. The area on your body should be completely wet with CHG when you are done scrubbing. Do not rinse. Discard the cloth and let the area air-dry. Do not put any substances on the area afterward, such as powder, lotion, or perfume. Put on clean clothes or pajamas. If it is the night before your surgery, sleep in clean sheets.  For general bathing Follow these steps when using CHG cloths for general bathing (unless your health care provider gives you different instructions). Use a separate CHG cloth for each area of your body. Make sure you wash between any folds of skin and between your fingers and toes. Wash your body in the following order, switching to a new cloth after each step: The front of your neck, shoulders, and chest. Both of your arms, under your arms, and your hands. Your stomach and groin area, avoiding the genitals. Your right leg and foot. Your left leg and foot. The back of your neck, your back, and your buttocks. Do not rinse. Discard the cloth and let the area air-dry. Do not put any substances on your body afterward--such as powder, lotion, or perfume--unless you are told to do so by your health care provider. Only use lotions that are recommended by the manufacturer. Put on clean clothes or pajamas. Contact a health care provider if: Your skin gets irritated after scrubbing. You have questions about using your solution or cloth. You swallow any chlorhexidine. Call your local poison  control center (1-386-236-5038 in the [place] Get help right away if: Your eyes itch badly, or they become very red or swollen. Your skin itches badly and is red or swollen. Your hearing changes. You have trouble seeing. You have swelling or tingling in your mouth or throat. You have trouble breathing. These symptoms may represent a serious problem that is an emergency. Do not wait to see if the symptoms will go away. Get medical help right away. Call your local emergency services (911 in the [place] Do not drive yourself to the hospital. Summary Chlorhexidine gluconate (CHG) is a germ-killing (antiseptic) solution that is used to clean the skin. Cleaning your skin with CHG may help to lower your risk for infection. You may be given CHG to use for bathing. It may be in a bottle or in a prepackaged cloth to use on your skin. Carefully follow your health care provider's instructions and the instructions on the product label. Do not use CHG if you have a chlorhexidine allergy. Contact your health care provider if your skin gets irritated after scrubbing. This information is not intended to replace advice given to you by your health care provider. Make sure you discuss any questions you have with your health care provider. Document Revised: 09/27/2020 Document Reviewed: 09/27/2020 Elsevier Patient Education  2022 [employer].

## 2021-04-28 ENCOUNTER — Encounter (HOSPITAL_COMMUNITY): Payer: Self-pay

## 2021-04-28 ENCOUNTER — Encounter (HOSPITAL_COMMUNITY)
Admission: RE | Admit: 2021-04-28 | Discharge: 2021-04-28 | Disposition: A | Discharge status: Home / Self Care | Payer: MEDICARE | Source: Ambulatory Visit | Attending: Urology | Admitting: Urology

## 2021-04-28 ENCOUNTER — Other Ambulatory Visit: Payer: Self-pay

## 2021-04-28 DIAGNOSIS — Z01812 Encounter for preprocedural laboratory examination: Secondary | ICD-10-CM | POA: Diagnosis not present

## 2021-05-02 ENCOUNTER — Encounter (HOSPITAL_COMMUNITY)
Admission: RE | Disposition: A | Discharge status: Home / Self Care | Payer: Self-pay | Source: Home / Self Care | Attending: Urology

## 2021-05-02 ENCOUNTER — Ambulatory Visit (HOSPITAL_COMMUNITY): Payer: MEDICARE | Admitting: Anesthesiology

## 2021-05-02 ENCOUNTER — Ambulatory Visit (HOSPITAL_COMMUNITY)
Admission: RE | Admit: 2021-05-02 | Discharge: 2021-05-02 | Disposition: A | Discharge status: Home / Self Care | Payer: MEDICARE | Attending: Urology | Admitting: Urology

## 2021-05-02 ENCOUNTER — Encounter (HOSPITAL_COMMUNITY): Payer: Self-pay | Admitting: Urology

## 2021-05-02 DIAGNOSIS — I2510 Atherosclerotic heart disease of native coronary artery without angina pectoris: Secondary | ICD-10-CM | POA: Diagnosis not present

## 2021-05-02 DIAGNOSIS — N401 Enlarged prostate with lower urinary tract symptoms: Secondary | ICD-10-CM | POA: Diagnosis not present

## 2021-05-02 DIAGNOSIS — N138 Other obstructive and reflux uropathy: Secondary | ICD-10-CM | POA: Insufficient documentation

## 2021-05-02 DIAGNOSIS — R338 Other retention of urine: Secondary | ICD-10-CM | POA: Diagnosis not present

## 2021-05-02 HISTORY — PX: CYSTOSCOPY WITH INSERTION OF UROLIFT: SHX6678

## 2021-05-02 SURGERY — CYSTOSCOPY WITH INSERTION OF UROLIFT
Anesthesia: General | Site: Prostate

## 2021-05-02 MED ORDER — LIDOCAINE 2% (20 MG/ML) 5 ML SYRINGE
INTRAMUSCULAR | Status: DC | PRN
  Administered 2021-05-02: 80 mg via INTRAVENOUS

## 2021-05-02 MED ORDER — PROPOFOL 10 MG/ML IV BOLUS
INTRAVENOUS | Status: DC | PRN
  Administered 2021-05-02: 150 mg via INTRAVENOUS

## 2021-05-02 MED ORDER — ONDANSETRON HCL 4 MG/2ML IJ SOLN: 4.0000 mg | Freq: Once | INTRAMUSCULAR | Status: DC | PRN

## 2021-05-02 MED ORDER — LACTATED RINGERS IV SOLN
INTRAVENOUS | Status: DC
  Administered 2021-05-02: 1000 mL via INTRAVENOUS

## 2021-05-02 MED ORDER — TRAMADOL HCL 50 MG PO TABS: 50.0000 mg | ORAL_TABLET | Freq: Four times a day (QID) | ORAL | 0 refills | Status: DC | PRN

## 2021-05-02 MED ORDER — SODIUM CHLORIDE 0.9 % IR SOLN
Status: DC | PRN
  Administered 2021-05-02: 3000 mL

## 2021-05-02 MED ORDER — ONDANSETRON HCL 4 MG/2ML IJ SOLN
INTRAMUSCULAR | Status: AC
  Filled 2021-05-02: qty 2

## 2021-05-02 MED ORDER — LIDOCAINE HCL URETHRAL/MUCOSAL 2 % EX GEL
CUTANEOUS | Status: AC
  Filled 2021-05-02: qty 10

## 2021-05-02 MED ORDER — CHLORHEXIDINE GLUCONATE 0.12 % MT SOLN
15.0000 mL | Freq: Once | OROMUCOSAL | Status: AC
  Administered 2021-05-02: 15 mL via OROMUCOSAL

## 2021-05-02 MED ORDER — CEFAZOLIN SODIUM-DEXTROSE 2-4 GM/100ML-% IV SOLN
2.0000 g | INTRAVENOUS | Status: AC
  Administered 2021-05-02: 2 g via INTRAVENOUS

## 2021-05-02 MED ORDER — ORAL CARE MOUTH RINSE: 15.0000 mL | Freq: Once | OROMUCOSAL | Status: AC

## 2021-05-02 MED ORDER — CEFAZOLIN SODIUM-DEXTROSE 2-4 GM/100ML-% IV SOLN
INTRAVENOUS | Status: AC
  Filled 2021-05-02: qty 100

## 2021-05-02 MED ORDER — WATER FOR IRRIGATION, STERILE IR SOLN
Status: DC | PRN
  Administered 2021-05-02: 500 mL

## 2021-05-02 MED ORDER — FENTANYL CITRATE (PF) 100 MCG/2ML IJ SOLN
INTRAMUSCULAR | Status: AC
  Filled 2021-05-02: qty 2

## 2021-05-02 MED ORDER — ONDANSETRON HCL 4 MG/2ML IJ SOLN
INTRAMUSCULAR | Status: DC | PRN
Start: 2021-05-02 — End: 2021-05-02
  Administered 2021-05-02: 4 mg via INTRAVENOUS

## 2021-05-02 MED ORDER — FENTANYL CITRATE PF 50 MCG/ML IJ SOSY: 25.0000 ug | PREFILLED_SYRINGE | INTRAMUSCULAR | Status: DC | PRN

## 2021-05-02 MED ORDER — FENTANYL CITRATE (PF) 100 MCG/2ML IJ SOLN
INTRAMUSCULAR | Status: DC | PRN
  Administered 2021-05-02: 50 ug via INTRAVENOUS

## 2021-05-02 MED ORDER — EPHEDRINE SULFATE-NACL 50-0.9 MG/10ML-% IV SOSY
PREFILLED_SYRINGE | INTRAVENOUS | Status: DC | PRN
  Administered 2021-05-02 (×2): 5 mg via INTRAVENOUS

## 2021-05-02 SURGICAL SUPPLY — 20 items
BAG DRAIN URO TABLE W/ADPT NS (BAG) ×2 IMPLANT
BAG DRN 8 ADPR NS SKTRN CSTL (BAG) ×1
BAG HAMPER (MISCELLANEOUS) ×2 IMPLANT
CATH FOLEY 2WAY SLVR  5CC 18FR (CATHETERS) ×1
CATH FOLEY 2WAY SLVR 5CC 18FR (CATHETERS) ×1 IMPLANT
CLOTH BEACON ORANGE TIMEOUT ST (SAFETY) ×2 IMPLANT
GLOVE SURG POLYISO LF SZ8 (GLOVE) ×2 IMPLANT
GLOVE SURG UNDER POLY LF SZ7 (GLOVE) ×4 IMPLANT
GOWN STRL REUS W/TWL LRG LVL3 (GOWN DISPOSABLE) ×2 IMPLANT
GOWN STRL REUS W/TWL XL LVL3 (GOWN DISPOSABLE) ×2 IMPLANT
KIT TURNOVER CYSTO (KITS) ×2 IMPLANT
MANIFOLD NEPTUNE II (INSTRUMENTS) ×2 IMPLANT
PACK CYSTO (CUSTOM PROCEDURE TRAY) ×2 IMPLANT
PAD ARMBOARD 7.5X6 YLW CONV (MISCELLANEOUS) ×2 IMPLANT
SYSTEM UROLIFT (Male Continence) ×12 IMPLANT
TOWEL OR 17X26 4PK STRL BLUE (TOWEL DISPOSABLE) ×2 IMPLANT
TRAY FOLEY W/BAG SLVR 16FR (SET/KITS/TRAYS/PACK) ×2
TRAY FOLEY W/BAG SLVR 16FR ST (SET/KITS/TRAYS/PACK) ×1 IMPLANT
WATER STERILE IRR 3000ML UROMA (IV SOLUTION) ×2 IMPLANT
WATER STERILE IRR 500ML POUR (IV SOLUTION) ×2 IMPLANT

## 2021-05-02 NOTE — Anesthesia Preprocedure Evaluation (Signed)
Anesthesia Evaluation  Patient identified by MRN, date of birth, ID band Patient awake    Reviewed: Allergy & Precautions, H&P , NPO status , Patient's Chart, lab work & pertinent test results, reviewed documented beta blocker date and time   Airway Mallampati: II  TM Distance: >3 FB Neck ROM: full    Dental no notable dental hx.    Pulmonary sleep apnea , former smoker,    Pulmonary exam normal breath sounds clear to auscultation       Cardiovascular Exercise Tolerance: Good hypertension, + CAD and + Past MI   Rhythm:regular Rate:Normal     Neuro/Psych PSYCHIATRIC DISORDERS Anxiety Depression negative neurological ROS     GI/Hepatic Neg liver ROS, GERD  Medicated,  Endo/Other  negative endocrine ROS  Renal/GU negative Renal ROS  negative genitourinary   Musculoskeletal   Abdominal   Peds  Hematology negative hematology ROS (+)   Anesthesia Other Findings   Reproductive/Obstetrics negative OB ROS                             Anesthesia Physical Anesthesia Plan  ASA: 3  Anesthesia Plan: General   Post-op Pain Management:    Induction:   PONV Risk Score and Plan: Ondansetron  Airway Management Planned:   Additional Equipment:   Intra-op Plan:   Post-operative Plan:   Informed Consent: I have reviewed the patients History and Physical, chart, labs and discussed the procedure including the risks, benefits and alternatives for the proposed anesthesia with the patient or authorized representative who has indicated his/her understanding and acceptance.     Dental Advisory Given  Plan Discussed with: CRNA  Anesthesia Plan Comments:         Anesthesia Quick Evaluation

## 2021-05-02 NOTE — Transfer of Care (Signed)
Immediate Anesthesia Transfer of Care Note  Patient: David Combs  Procedure(s) Performed: CYSTOSCOPY WITH INSERTION OF UROLIFT  Patient Location: PACU  Anesthesia Type:General  Level of Consciousness: drowsy  Airway & Oxygen Therapy: Patient Spontanous Breathing and Patient connected to nasal cannula oxygen  Post-op Assessment: Report given to RN and Post -op Vital signs reviewed and stable  Post vital signs: Reviewed and stable  Last Vitals:  Vitals Value Taken Time  BP    Temp    Pulse 74 05/02/21 1401  Resp 16 05/02/21 1401  SpO2 98 % 05/02/21 1401  Vitals shown include unvalidated device data.  Last Pain:  Vitals:   05/02/21 1226  TempSrc: Oral  PainSc: 0-No pain      Patients Stated Pain Goal: 5 (86/76/72 0947)  Complications: No notable events documented.

## 2021-05-02 NOTE — Anesthesia Postprocedure Evaluation (Signed)
Anesthesia Post Note  Patient: David Combs  Procedure(s) Performed: CYSTOSCOPY WITH INSERTION OF UROLIFT (Prostate)  Patient location during evaluation: Phase II Anesthesia Type: General Level of consciousness: awake Pain management: pain level controlled Vital Signs Assessment: post-procedure vital signs reviewed and stable Respiratory status: spontaneous breathing and respiratory function stable Cardiovascular status: blood pressure returned to baseline and stable Postop Assessment: no headache and no apparent nausea or vomiting Anesthetic complications: no Comments: Late entry   No notable events documented.   Last Vitals:  Vitals:   05/02/21 1430 05/02/21 1445  BP: (!) 144/80 134/85  Pulse: 62 (!) 56  Resp: 12 13  Temp:    SpO2: 100% 97%    Last Pain:  Vitals:   05/02/21 1430  TempSrc:   PainSc: 0-No pain                 Louann Sjogren

## 2021-05-02 NOTE — Interval H&P Note (Signed)
History and Physical Interval Note:  05/02/2021 12:22 PM  David Combs  has presented today for surgery, with the diagnosis of BPH with urinary retention.  The various methods of treatment have been discussed with the patient and family. After consideration of risks, benefits and other options for treatment, the patient has consented to  Procedure(s): CYSTOSCOPY WITH INSERTION OF UROLIFT (N/A) as a surgical intervention.  The patient's history has been reviewed, patient examined, no change in status, stable for surgery.  I have reviewed the patient's chart and labs.  Questions were answered to the patient's satisfaction.     Nicolette Bang

## 2021-05-02 NOTE — Anesthesia Procedure Notes (Signed)
Procedure Name: LMA Insertion Date/Time: 05/02/2021 1:25 PM Performed by: Gwyndolyn Saxon, CRNA Pre-anesthesia Checklist: Patient identified, Emergency Drugs available, Suction available and Patient being monitored Patient Re-evaluated:Patient Re-evaluated prior to induction Oxygen Delivery Method: Circle system utilized Preoxygenation: Pre-oxygenation with 100% oxygen Induction Type: IV induction LMA: LMA inserted LMA Size: 5.0 Number of attempts: 1 Placement Confirmation: positive ETCO2 and breath sounds checked- equal and bilateral Tube secured with: Tape Dental Injury: Teeth and Oropharynx as per pre-operative assessment

## 2021-05-02 NOTE — Op Note (Signed)
   PREOPERATIVE DIAGNOSIS:  Benign prostatic hypertrophy with bladder outlet obstruction.  POSTOPERATIVE DIAGNOSIS:  Benign prostatic hypertrophy with bladder outlet obstruction.  PROCEDURE:  Cystoscopy with implantation of UroLift devices, 6 implants.  SURGEON:  Nicolette Bang, M.D.  ANESTHESIA:  General  ANTIBIOTICS: ancef  SPECIMEN:  None.  DRAINS:  A 18-French Foley catheter.  BLOOD LOSS:  Minimal.  COMPLICATIONS:  None.  INDICATIONS: The Patient is an 75 year old male with BPH and bladder outlet obstruction.  He has failed medical therapy and has elected UroLift for definitive treatment.  FINDINGS OF PROCEDURE:  He was taken to the operating room where a general anesthetic was induced.  He was placed in lithotomy position and was fitted with PAS hose.  His perineum and genitalia were prepped with chlorhexidine, and he was draped in usual sterile fashion.  Cystoscopy was performed using the UroLift scope and 0 degree lens. Examination revealed a normal urethra.  The external sphincter was intact.  Prostatic urethra was approximately 5 cm in length with lateral lobe enlargement. There was also little bit of bladder neck elevation. Inspection of bladder revealed mild-to-moderate trabeculation with no tumors, stones, or inflammation.  No cellules or diverticula were noted. Ureteral orifices were in their normal anatomic position effluxing clear urine.  After initial cystoscopy, the visual obturator was replaced with the first UroLift device.  This was turned to the 9 o'clock position and pulled back to the veru and then slightly advanced.  Pressure was then applied to the right lateral lobe and the UroLift device was deployed.  The second UroLift device was then inserted and applied to the left lateral lobe at 3 o'clock and deployed in the mid prostatic urethra. After this, there was still some apparent obstruction closer to the bladder neck.  So a second and  third level of UroLift your left device was applied between the mid urethra and the proximal urethra providing further patency to the prostatic urethra.  The scope was removed and a 16-French Foley catheter was inserted without difficulty. The balloon was filled with 10 mL sterile fluid, and the catheter was placed to straight drainage.  COMPLICATIONS: None   CONDITION: Stable, extubated, transferred to PACU  PLAN: The patient will be discharged home and followup in 2 days for a voiding trial.

## 2021-05-02 NOTE — Progress Notes (Signed)
Patient's wife, Santiago Glad, expressed concern that the urine in Pt's foley tube was bright red.  Dr. Alyson Ingles was called and the matter was discussed with him.  He said the blood was normal and that it should clear up over the next few days provided the patient drinks sufficient water.  Santiago Glad was told to have the patient drink plenty of water.   Santiago Glad advised to call Dr.McKenzie's office if the urine remained bright red and bloody.

## 2021-05-03 ENCOUNTER — Telehealth: Payer: Self-pay

## 2021-05-03 ENCOUNTER — Encounter (HOSPITAL_COMMUNITY): Payer: Self-pay | Admitting: Urology

## 2021-05-03 DIAGNOSIS — A419 Sepsis, unspecified organism: Secondary | ICD-10-CM | POA: Diagnosis not present

## 2021-05-03 DIAGNOSIS — R509 Fever, unspecified: Secondary | ICD-10-CM | POA: Diagnosis not present

## 2021-05-03 DIAGNOSIS — Z20822 Contact with and (suspected) exposure to covid-19: Secondary | ICD-10-CM | POA: Diagnosis not present

## 2021-05-03 DIAGNOSIS — R6521 Severe sepsis with septic shock: Secondary | ICD-10-CM | POA: Diagnosis not present

## 2021-05-03 DIAGNOSIS — R0902 Hypoxemia: Secondary | ICD-10-CM | POA: Diagnosis not present

## 2021-05-03 DIAGNOSIS — R61 Generalized hyperhidrosis: Secondary | ICD-10-CM | POA: Diagnosis not present

## 2021-05-03 DIAGNOSIS — I959 Hypotension, unspecified: Secondary | ICD-10-CM | POA: Diagnosis not present

## 2021-05-03 DIAGNOSIS — N390 Urinary tract infection, site not specified: Secondary | ICD-10-CM | POA: Diagnosis not present

## 2021-05-03 NOTE — Telephone Encounter (Signed)
Wife called concerned patient is not drinking enough. Wife states that patient did really well the night of surgery but today she has noticed a decrease in his oral intake. She states that he has had clear urine draining in his bag and is afebrile. He has been sleeping more and has required more assistance with his mobility. Patient has a f/u postop tomorrow.  Reiterated to wife given patients medical history and current surgery these symptoms can be expected. Wife informed that if patient stops voiding or spikes a fever go to ER for eval. Wife voiced understanding.

## 2021-05-03 NOTE — Progress Notes (Signed)
Post-op phone call made. Spoke with pt's wife Santiago Glad. Wife stated that she was concerned about pt's sluggish mentation since he has Parkinson's disease. She said that he was confused & lethargic. Told her that it was d/t anesthesia along with his disease process. Told wife that if she felt his mentation became worse to let Dr. Alyson Ingles know & to bring pt to  ED. Dr. Briant Cedar aware of situation.

## 2021-05-04 ENCOUNTER — Ambulatory Visit: Payer: MEDICARE | Admitting: Urology

## 2021-05-04 ENCOUNTER — Telehealth: Payer: Self-pay

## 2021-05-04 NOTE — Telephone Encounter (Signed)
Patient wife called today to cancel post op visit. Per wife patient became increasing lethargic and febrile of 104. Wife called EMS  Patient currently at [facility] per wife with sepsis.  Message sent to MD

## 2021-05-05 ENCOUNTER — Telehealth: Payer: Self-pay

## 2021-05-05 NOTE — Telephone Encounter (Signed)
Pt's wife called  to inform us that he going to rehab center for care. She was concerned on how to  bring him to his appt. Per Estill Bamberg he can follow up with [place] after he is treated at the rehab center. Pt 's wife was concern pt that pt is wearing a cathter. Informed pt that they have provider on staff to elevated his care. And they will follow up with Dr. Alyson Ingles if needed.

## 2021-05-10 ENCOUNTER — Telehealth: Payer: Self-pay

## 2021-05-10 DIAGNOSIS — Z7401 Bed confinement status: Secondary | ICD-10-CM | POA: Diagnosis not present

## 2021-05-10 NOTE — Telephone Encounter (Signed)
Pt's wife called regarding pt  Cathter, pt had surgery a couple weeks ago and was in hospital for septic and now at [facility] in [place] and wants to know what to do about the cathter that was place after his surgery and would like a call back, pt was told that their provider on staff will elevate him this , pt's wife would like some to call her back. Please advise [phone]

## 2021-05-11 DIAGNOSIS — Z23 Encounter for immunization: Secondary | ICD-10-CM | POA: Diagnosis not present

## 2021-05-11 DIAGNOSIS — D519 Vitamin B12 deficiency anemia, unspecified: Secondary | ICD-10-CM | POA: Diagnosis not present

## 2021-05-11 DIAGNOSIS — E559 Vitamin D deficiency, unspecified: Secondary | ICD-10-CM | POA: Diagnosis not present

## 2021-05-11 DIAGNOSIS — E119 Type 2 diabetes mellitus without complications: Secondary | ICD-10-CM | POA: Diagnosis not present

## 2021-05-11 DIAGNOSIS — D649 Anemia, unspecified: Secondary | ICD-10-CM | POA: Diagnosis not present

## 2021-05-11 NOTE — Telephone Encounter (Signed)
Amber from [place] facility called to inform Dr. Alyson Ingles patient had fallen out of the bed last night and pulled his catheter out. NP at facility had catheter replaced.  Discussed with Dr. Alyson Ingles, catheter to be removed and if patient does not void in 24 hours insert. Amber voiced understanding.

## 2021-05-12 ENCOUNTER — Telehealth: Payer: Self-pay | Admitting: Cardiology

## 2021-05-12 NOTE — Telephone Encounter (Signed)
New message    Patient wife David Combs is calling with questions about the patients Eliquis - the surgeon held him off of it longer than what was recommended because of the patient having bleeding.

## 2021-05-12 NOTE — Telephone Encounter (Signed)
Spoke to pt's wife who stated that the pt had surgery on 10/3 with Dr. Alyson Ingles for a Urolift at [facility]. Pt ended up at the Emergency room in [place]. Due to sepsis on 10/4. Hospitalist at the [facility] hospital decided to keep pt off of Eliquis d/t bleeding and clotting of his blood. Pt's wife stated that pt is now in a rehab facility in [place] to regain his strength after his surgery/hospital stay. Pt's wife is concerned that pt has been off of Eliquis for too long. Rehab facility completed lab work on 10/12. I will request a copy.  Please advise.

## 2021-05-12 NOTE — Telephone Encounter (Signed)
Discharge summary and lab work from [facility] requested.

## 2021-05-17 DIAGNOSIS — Z03818 Encounter for observation for suspected exposure to other biological agents ruled out: Secondary | ICD-10-CM | POA: Diagnosis not present

## 2021-05-17 NOTE — Telephone Encounter (Signed)
I was able to review copy of discharge summary from [facility].  Information provided to me on October 18.  Discharge summary indicates that David Combs was to stay off Eliquis until he had follow-up with urology.  Presumably if at that point he had no further hematuria or other bleeding he could go back on anticoagulation.  Please check with patient's wife about whether he has had urology follow-up.

## 2021-05-18 ENCOUNTER — Ambulatory Visit: Payer: MEDICARE | Admitting: Urology

## 2021-05-18 NOTE — Telephone Encounter (Signed)
Wife stated pt has an appt with Urology on Nov. 4th

## 2021-05-20 DIAGNOSIS — U071 COVID-19: Secondary | ICD-10-CM | POA: Diagnosis not present

## 2021-05-23 DIAGNOSIS — E1165 Type 2 diabetes mellitus with hyperglycemia: Secondary | ICD-10-CM | POA: Diagnosis not present

## 2021-05-23 DIAGNOSIS — E119 Type 2 diabetes mellitus without complications: Secondary | ICD-10-CM | POA: Diagnosis not present

## 2021-06-03 ENCOUNTER — Ambulatory Visit: Payer: MEDICARE | Admitting: Urology

## 2021-06-08 DIAGNOSIS — N401 Enlarged prostate with lower urinary tract symptoms: Secondary | ICD-10-CM | POA: Diagnosis not present

## 2021-06-09 DIAGNOSIS — E119 Type 2 diabetes mellitus without complications: Secondary | ICD-10-CM | POA: Diagnosis not present

## 2021-07-01 ENCOUNTER — Other Ambulatory Visit: Payer: Self-pay

## 2021-07-01 ENCOUNTER — Encounter: Payer: Self-pay | Admitting: Urology

## 2021-07-01 ENCOUNTER — Ambulatory Visit (INDEPENDENT_AMBULATORY_CARE_PROVIDER_SITE_OTHER): Payer: MEDICARE | Admitting: Urology

## 2021-07-01 ENCOUNTER — Ambulatory Visit: Payer: MEDICARE | Admitting: Urology

## 2021-07-01 DIAGNOSIS — N3281 Overactive bladder: Secondary | ICD-10-CM | POA: Diagnosis not present

## 2021-07-01 DIAGNOSIS — R3912 Poor urinary stream: Secondary | ICD-10-CM

## 2021-07-01 DIAGNOSIS — I25119 Atherosclerotic heart disease of native coronary artery with unspecified angina pectoris: Secondary | ICD-10-CM

## 2021-07-01 DIAGNOSIS — R339 Retention of urine, unspecified: Secondary | ICD-10-CM | POA: Diagnosis not present

## 2021-07-01 NOTE — Progress Notes (Signed)
post void residual=63  Urological Symptom Review  Patient is experiencing the following symptoms: none   Review of Systems  Gastrointestinal (upper)  : Negative for upper GI symptoms  Gastrointestinal (lower) : Constipation  Constitutional : Weight loss  Skin: Negative for skin symptoms  Eyes: Negative for eye symptoms  Ear/Nose/Throat : Negative for Ear/Nose/Throat symptoms  Hematologic/Lymphatic: Negative for Hematologic/Lymphatic symptoms  Cardiovascular : Negative for cardiovascular symptoms  Respiratory : Negative for respiratory symptoms  Endocrine: Negative for endocrine symptoms  Musculoskeletal: Joint pain  Neurological: Negative for neurological symptoms  Psychologic: Depression Anxiety

## 2021-07-01 NOTE — Progress Notes (Signed)
07/01/2021 10:32 AM   David Combs Mar 25, 1946 [id number]  Referring provider: Glenda Chroman, MD [place],  [place] [id number]  Followup BPH   HPI: David Combs is a 75yo here for followup for BPH. He underwent Urolift 10/3. He developed sepsis from a urinary source and RSV 2 days post op and was admitted to [facility]. He is currently in a nursing facility. He is confined to a stretcher and is currently incontinent of urine. PVR 60cc. He was started on cipro yesterday for a UTI.  He has been confused since hospitalization.    PMH: Past Medical History:  Diagnosis Date   Agent orange exposure    Anxiety    Arthritis    Atrial fibrillation (HCC)    Chronic lower back pain    Coronary atherosclerosis of native coronary artery    70-80% small diagonal - medically managed, LVEF 55%   Depression    Dermatophytosis of nail    ED (erectile dysfunction)    Essential hypertension    GERD (gastroesophageal reflux disease)    History of colonic polyps 10/08/2017   History of pneumonia    Lumbago    Mixed hyperlipidemia    Non-ST elevation myocardial infarction (NSTEMI) ([place]) 05/2010   Obesity    Parkinson disease ([place])    Paroxysmal atrial fibrillation (HCC)    Prostatitis    Rotator cuff tear, left    Sleep apnea    does not use CPAP    Surgical History: Past Surgical History:  Procedure Laterality Date   ANTERIOR CERVICAL DECOMP/DISCECTOMY FUSION     APPENDECTOMY     BACK SURGERY     CARDIAC CATHETERIZATION  X 3   CATARACT EXTRACTION W/ INTRAOCULAR LENS  IMPLANT, BILATERAL Bilateral    CHOLECYSTECTOMY OPEN  05/2014   rt upper quadrant pain    COLONOSCOPY N/A 11/29/2017   Procedure: COLONOSCOPY;  Surgeon: Rogene Houston, MD;  [facility];  Service: Endoscopy;  Laterality: N/A;  8:30   CYSTOSCOPY WITH INSERTION OF UROLIFT N/A 05/02/2021   Procedure: CYSTOSCOPY WITH INSERTION OF UROLIFT;  Surgeon: Cleon Gustin, MD;  Location: [facility];  Service:  Urology;  Laterality: N/A;   ESOPHAGOGASTRODUODENOSCOPY N/A 12/02/2014   Procedure: ESOPHAGOGASTRODUODENOSCOPY (EGD);  Surgeon: Rogene Houston, MD;  [facility];  Service: Endoscopy;  Laterality: N/A;  200   FRACTURE SURGERY Left    wrust   KNEE ARTHROSCOPY Right    LEFT HEART CATH AND CORONARY ANGIOGRAPHY N/A 02/12/2018   Procedure: LEFT HEART CATH AND CORONARY ANGIOGRAPHY;  Surgeon: Troy Sine, MD;  Location: [facility];  Service: Cardiovascular;  Laterality: N/A;   LUMBAR DISC SURGERY     POLYPECTOMY  11/29/2017   Procedure: POLYPECTOMY;  Surgeon: Rogene Houston, MD;  [facility]: [facility];  Service: Endoscopy;;  transverse   SHOULDER ARTHROSCOPY WITH ROTATOR CUFF REPAIR AND OPEN BICEPS TENODESIS Left 12/30/2018   Procedure: LEFT SHOULDER ARTHROSCOPY WITH ROTATOR CUFF REPAIR;  Surgeon: Netta Cedars, MD;  Location: [facility];  Service: Orthopedics;  Laterality: Left;   TONSILLECTOMY  1958   WRIST FRACTURE SURGERY Left 1994    Home Medications:  Allergies as of 07/01/2021       Reactions   Iodinated Diagnostic Agents Swelling   Atorvastatin    Muscle pain   Nabumetone Nausea And Vomiting   Other    Steroid injection caused afib   Simvastatin    Muscle pain  Sulfa Drugs Cross Reactors Itching        Medication List        Accurate as of July 01, 2021 10:32 AM. If you have any questions, ask your nurse or doctor.          STOP taking these medications    clonazePAM 0.5 MG tablet Commonly known as: KLONOPIN Stopped by: Nicolette Bang, MD   donepezil 10 MG tablet Commonly known as: ARICEPT Stopped by: Nicolette Bang, MD   traMADol 50 MG tablet Commonly known as: Ultram Stopped by: Nicolette Bang, MD       TAKE these medications    apixaban 5 MG Tabs tablet Commonly known as: Eliquis Take 1 tablet (5 mg total) by mouth 2 (two) times daily.   buPROPion 300 MG 24 hr tablet Commonly known as:  WELLBUTRIN XL Take 300 mg by mouth daily.   carbidopa-levodopa 10-100 MG tablet Commonly known as: SINEMET IR Take 3 tablets by mouth 3 (three) times daily.   carbidopa-levodopa 50-200 MG tablet Commonly known as: SINEMET CR Take 1 tablet by mouth at bedtime.   carboxymethylcellulose 0.5 % Soln Commonly known as: REFRESH PLUS Place 1 drop into both eyes daily.   clotrimazole-betamethasone cream Commonly known as: Lotrisone Apply 1 application topically 2 (two) times daily.   docusate sodium 100 MG capsule Commonly known as: COLACE Take 100 mg by mouth daily.   fluticasone 50 MCG/ACT nasal spray Commonly known as: FLONASE Place 1 spray into both nostrils daily as needed for allergies or rhinitis.   Melatonin 5 MG Caps Take 15 mg by mouth at bedtime.   memantine 10 MG tablet Commonly known as: NAMENDA Take 10 mg by mouth 2 (two) times daily.   nitroGLYCERIN 0.4 MG SL tablet Commonly known as: NITROSTAT Place 1 tablet (0.4 mg total) under the tongue every 5 (five) minutes as needed for chest pain.   omeprazole 20 MG capsule Commonly known as: PRILOSEC Take 20 mg by mouth every evening.   polyethylene glycol powder 17 GM/SCOOP powder Commonly known as: GLYCOLAX/MIRALAX Take 8.5 g by mouth daily as needed. What changed:  how much to take when to take this   rosuvastatin 5 MG tablet Commonly known as: CRESTOR Take 2.5 mg by mouth daily.   Vitamin D3 25 MCG (1000 UT) Caps Take 1,000 Units by mouth 2 (two) times daily.        Allergies:  Allergies  Allergen Reactions   Iodinated Diagnostic Agents Swelling   Atorvastatin     Muscle pain   Nabumetone Nausea And Vomiting   Other     Steroid injection caused afib   Simvastatin     Muscle pain   Sulfa Drugs Cross Reactors Itching    Family History: Family History  Problem Relation Age of Onset   Lymphoma Sister     Social History:  reports that he quit smoking about 19 years ago. His smoking use  included cigarettes. He has a 40.00 pack-year smoking history. He has never used smokeless tobacco. He reports that he does not currently use alcohol. He reports that he does not use drugs.  ROS: All other review of systems were reviewed and are negative except what is noted above in HPI  Physical Exam: There were no vitals taken for this visit.  Constitutional:  Alert and oriented, No acute distress. HEENT: [place] AT, moist mucus membranes.  Trachea midline, no masses. Cardiovascular: No clubbing, cyanosis, or edema. Respiratory: Normal respiratory effort, no increased work  of breathing. GI: Abdomen is soft, nontender, nondistended, no abdominal masses GU: No CVA tenderness.  Lymph: No cervical or inguinal lymphadenopathy. Skin: No rashes, bruises or suspicious lesions. Neurologic: Grossly intact, no focal deficits, moving all 4 extremities. Psychiatric: Normal mood and affect.  Laboratory Data: Lab Results  Component Value Date   WBC 9.5 04/02/2021   HGB 14.5 04/02/2021   HCT 42.8 04/02/2021   MCV 101.7 (H) 04/02/2021   PLT 158 04/02/2021    Lab Results  Component Value Date   CREATININE 0.84 04/02/2021    No results found for: PSA  No results found for: TESTOSTERONE  No results found for: HGBA1C  Urinalysis    Component Value Date/Time   COLORURINE YELLOW 04/02/2021 1557   APPEARANCEUR HAZY (A) 04/02/2021 1557   APPEARANCEUR Clear 07/16/2020 1102   LABSPEC 1.020 04/02/2021 1557   PHURINE 6.0 04/02/2021 1557   GLUCOSEU NEGATIVE 04/02/2021 1557   HGBUR LARGE (A) 04/02/2021 1557   BILIRUBINUR NEGATIVE 04/02/2021 1557   BILIRUBINUR Negative 07/16/2020 1102   KETONESUR 15 (A) 04/02/2021 1557   PROTEINUR TRACE (A) 04/02/2021 1557   UROBILINOGEN negative (A) 01/28/2020 1612   NITRITE NEGATIVE 04/02/2021 1557   LEUKOCYTESUR TRACE (A) 04/02/2021 1557    Lab Results  Component Value Date   LABMICR Comment 07/16/2020   WBCUA None seen 05/06/2020   LABEPIT None seen  05/06/2020   MUCUS Present 05/06/2020   BACTERIA FEW (A) 04/02/2021    Pertinent Imaging:  No results found for this or any previous visit.  No results found for this or any previous visit.  No results found for this or any previous visit.  No results found for this or any previous visit.  No results found for this or any previous visit.  No results found for this or any previous visit.  No results found for this or any previous visit.  No results found for this or any previous visit.   Assessment & Plan:    1. Urinary retention -resolved - Urinalysis, Routine w reflex microscopic  2. Weak urinary stream -resolved  3. OAB (overactive bladder) -  Patient can restart Eliquis from a Urology standpoint.    No follow-ups on file.  Nicolette Bang, MD  [facility] [place]

## 2021-07-08 NOTE — Patient Instructions (Signed)

## 2021-07-14 NOTE — Telephone Encounter (Signed)
error 

## 2021-09-30 ENCOUNTER — Ambulatory Visit: Payer: MEDICARE | Admitting: Urology

## 2021-10-26 ENCOUNTER — Ambulatory Visit: Payer: MEDICARE | Admitting: Cardiology

## 2022-04-30 DEATH — deceased
